# Patient Record
Sex: Male | Born: 1975 | Race: White | Hispanic: No | Marital: Married | Smoking: Former smoker
Health system: Southern US, Community
[De-identification: ages and names within clinical notes are randomized; demographics above are authoritative.]

## PROBLEM LIST (undated history)

## (undated) DIAGNOSIS — J189 Pneumonia, unspecified organism: Secondary | ICD-10-CM

## (undated) DIAGNOSIS — F17200 Nicotine dependence, unspecified, uncomplicated: Secondary | ICD-10-CM

## (undated) DIAGNOSIS — R519 Headache, unspecified: Secondary | ICD-10-CM

## (undated) DIAGNOSIS — R51 Headache: Secondary | ICD-10-CM

## (undated) DIAGNOSIS — R002 Palpitations: Secondary | ICD-10-CM

## (undated) DIAGNOSIS — C649 Malignant neoplasm of unspecified kidney, except renal pelvis: Secondary | ICD-10-CM

## (undated) DIAGNOSIS — E041 Nontoxic single thyroid nodule: Secondary | ICD-10-CM

## (undated) HISTORY — DX: Headache: R51

## (undated) HISTORY — PX: PILONIDAL CYST EXCISION: SHX744

## (undated) HISTORY — DX: Nicotine dependence, unspecified, uncomplicated: F17.200

## (undated) HISTORY — PX: OTHER SURGICAL HISTORY: SHX169

## (undated) HISTORY — PX: PARTIAL HIP ARTHROPLASTY: SHX733

## (undated) HISTORY — DX: Nontoxic single thyroid nodule: E04.1

## (undated) HISTORY — PX: WISDOM TOOTH EXTRACTION: SHX21

## (undated) HISTORY — DX: Palpitations: R00.2

## (undated) HISTORY — DX: Headache, unspecified: R51.9

---

## 1997-09-27 ENCOUNTER — Emergency Department (HOSPITAL_COMMUNITY): Admission: EM | Admit: 1997-09-27 | Discharge: 1997-09-27 | Payer: Self-pay | Admitting: Emergency Medicine

## 2014-04-03 ENCOUNTER — Ambulatory Visit (INDEPENDENT_AMBULATORY_CARE_PROVIDER_SITE_OTHER): Payer: 59 | Admitting: Family Medicine

## 2014-04-03 ENCOUNTER — Encounter: Payer: Self-pay | Admitting: Family Medicine

## 2014-04-03 VITALS — BP 130/86 | HR 80 | Temp 98.4°F | Resp 18 | Ht 65.0 in | Wt 203.0 lb

## 2014-04-03 DIAGNOSIS — R002 Palpitations: Secondary | ICD-10-CM

## 2014-04-03 DIAGNOSIS — F17200 Nicotine dependence, unspecified, uncomplicated: Secondary | ICD-10-CM

## 2014-04-03 NOTE — Progress Notes (Signed)
Office Note 04/03/2014  CC:  Chief Complaint  Patient presents with  . Establish Care    never had PCP   HPI:  Gordon Mcclain is a 39 y.o. White male who is here to establish care. Patient's most recent primary MD: none Old records were not reviewed prior to or during today's visit.  Over the past week or so he has noted a sensation of his heart fluttering a few times a day for a few seconds. No assoc sx's such as CP, dizziness, diaphoresis, SOB, or presyncope.  He noted it more often when sitting still not working. No known trigger.  No known alleviating factors.   Work is always stressful, no more so lately than usual. This has never happened before.  No exercise regimen, but has occasion when he will work out and feels no abnormal CP, SOB, fatigue, or dizziness.  Past Medical History  Diagnosis Date  . Frequent headaches     occipital region mostly: takes ibuprofen 800 mg bid most days    Past Surgical History  Procedure Laterality Date  . Pilonidal cyst excision  approx 2002    Family History  Problem Relation Age of Onset  . Arthritis Mother   . Cancer Maternal Aunt   . Heart attack Maternal Uncle   . Heart attack Maternal Grandmother   . Heart attack Maternal Grandfather   . Heart attack Maternal Aunt     History   Social History  . Marital Status: Married    Spouse Name: N/A    Number of Children: N/A  . Years of Education: N/A   Occupational History  . Not on file.   Social History Main Topics  . Smoking status: Current Every Day Smoker -- 1.00 packs/day for 20 years    Types: Cigarettes  . Smokeless tobacco: Never Used  . Alcohol Use: No  . Drug Use: No  . Sexual Activity: Not on file   Other Topics Concern  . Not on file   Social History Narrative   Married, 1 daughter and one son.   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   Orig from [place], [place] area.   Education: HS.   Tob 20  pck-yr hx  (current as of 03/2014).   Alc: none.  No hx of drug use.   Caffeine: 3-4 cups coffee/day, 1 cup tea per day.   No exercise.   MEDS: none except ibuprofen 800 mg bid most days  No Known Allergies  ROS Review of Systems  Constitutional: Negative for fever and fatigue.  HENT: Negative for congestion and sore throat.   Eyes: Negative for visual disturbance.  Respiratory: Negative for cough.   Cardiovascular: Negative for chest pain.  Gastrointestinal: Negative for nausea and abdominal pain.  Genitourinary: Negative for dysuria.  Musculoskeletal: Positive for arthralgias (traps/neck/back--due to physical labor/stress of work). Negative for back pain and joint swelling.  Skin: Negative for rash.  Neurological: Negative for weakness and headaches.  Hematological: Negative for adenopathy.    PE; Blood pressure 130/86, pulse 80, temperature 98.4 F (36.9 C), temperature source Temporal, resp. rate 18, height 5\' 5"  (1.651 m), weight 203 lb (92.08 kg), SpO2 96 %. Gen: Alert, well appearing.  Patient is oriented to person, place, time, and situation. VEH:MCNO: no injection, icteris, swelling, or exudate.  EOMI, PERRLA. Mouth: lips without lesion/swelling.  Oral mucosa pink and moist. Oropharynx without erythema, exudate, or swelling.  Neck - No masses or thyromegaly or limitation in range of motion  CV: RRR, no m/r/g.   LUNGS: CTA bilat, nonlabored resps, good aeration in all lung fields. ABD: soft, NT, ND, BS normal.  No hepatospenomegaly or mass.  No bruits. EXT: no clubbing, cyanosis, or edema.   Pertinent labs:  12 lead EKG today: NSR, rate 69, normal intervals, durations, and voltages.  Axis normal.  No ectopy or ischemic changes. Essentially a normal EKG.  No prior EKG available for comparison.  ASSESSMENT AND PLAN:   New pt: no old records to obtain.  Palpitations: reassuring EKG today. Very short duration of sx's. He does have excessive caffeine intake+ excessive work  stress. Discussed cutting back on coffee intake by 1/2 cup per week until he is down to 1 cup per day along with his 1 cup of tea per day. Work on stress reduction/coping techniques. Encouraged smoking cessation but he is not contemplating this at this time.  An After Visit Summary was printed and given to the patient.  Return in about 2 months (around 06/02/2014) for f/u palpitations.Earlier if sx's worsen.

## 2014-04-06 ENCOUNTER — Telehealth: Payer: Self-pay | Admitting: Family Medicine

## 2014-04-06 NOTE — Telephone Encounter (Signed)
emmi emailed °

## 2014-04-24 ENCOUNTER — Other Ambulatory Visit (INDEPENDENT_AMBULATORY_CARE_PROVIDER_SITE_OTHER): Payer: 59

## 2014-04-24 DIAGNOSIS — R002 Palpitations: Secondary | ICD-10-CM

## 2014-04-24 LAB — COMPREHENSIVE METABOLIC PANEL
ALK PHOS: 64 U/L (ref 39–117)
ALT: 21 U/L (ref 0–53)
AST: 20 U/L (ref 0–37)
Albumin: 4.2 g/dL (ref 3.5–5.2)
BUN: 11 mg/dL (ref 6–23)
CHLORIDE: 104 meq/L (ref 96–112)
CO2: 28 mEq/L (ref 19–32)
Calcium: 9.5 mg/dL (ref 8.4–10.5)
Creatinine, Ser: 0.78 mg/dL (ref 0.40–1.50)
GFR: 117.34 mL/min (ref >60.00)
GLUCOSE: 76 mg/dL (ref 70–99)
Potassium: 4.3 mEq/L (ref 3.5–5.1)
Sodium: 138 mEq/L (ref 135–145)
Total Bilirubin: 0.7 mg/dL (ref 0.2–1.2)
Total Protein: 6.5 g/dL (ref 6.0–8.3)

## 2014-04-24 LAB — CBC WITH DIFFERENTIAL/PLATELET
BASOS ABS: 0 10*3/uL (ref 0.0–0.1)
Basophils Relative: 0.4 % (ref 0.0–3.0)
Eosinophils Absolute: 0.2 10*3/uL (ref 0.0–0.7)
Eosinophils Relative: 1.9 % (ref 0.0–5.0)
HCT: 46.3 % (ref 39.0–52.0)
HEMOGLOBIN: 15.7 g/dL (ref 13.0–17.0)
LYMPHS PCT: 26.3 % (ref 12.0–46.0)
Lymphs Abs: 2.2 10*3/uL (ref 0.7–4.0)
MCHC: 34 g/dL (ref 30.0–36.0)
MCV: 89.7 fl (ref 78.0–100.0)
Monocytes Absolute: 0.5 10*3/uL (ref 0.1–1.0)
Monocytes Relative: 6.3 % (ref 3.0–12.0)
Neutro Abs: 5.4 10*3/uL (ref 1.4–7.7)
Neutrophils Relative %: 65.1 % (ref 43.0–77.0)
PLATELETS: 218 10*3/uL (ref 150.0–400.0)
RBC: 5.16 Mil/uL (ref 4.22–5.81)
RDW: 13.1 % (ref 11.5–15.5)
WBC: 8.3 10*3/uL (ref 4.0–10.5)

## 2014-04-24 LAB — LIPID PANEL
CHOL/HDL RATIO: 5
CHOLESTEROL: 196 mg/dL (ref 0–200)
HDL: 37.9 mg/dL — ABNORMAL LOW (ref >39.00)
LDL CALC: 122 mg/dL — AB (ref 0–99)
NONHDL: 158.1
Triglycerides: 181 mg/dL — ABNORMAL HIGH (ref 0.0–149.0)
VLDL: 36.2 mg/dL (ref 0.0–40.0)

## 2014-04-24 LAB — TSH: TSH: 0.96 u[IU]/mL (ref 0.35–4.50)

## 2014-05-27 ENCOUNTER — Ambulatory Visit: Payer: 59 | Admitting: Family Medicine

## 2020-11-22 ENCOUNTER — Encounter (HOSPITAL_COMMUNITY): Payer: Self-pay | Admitting: Emergency Medicine

## 2020-11-22 ENCOUNTER — Other Ambulatory Visit: Payer: Self-pay

## 2020-11-22 ENCOUNTER — Emergency Department (HOSPITAL_COMMUNITY)
Admission: EM | Admit: 2020-11-22 | Discharge: 2020-11-22 | Disposition: A | Discharge status: Home / Self Care | Payer: 59 | Attending: Emergency Medicine | Admitting: Emergency Medicine

## 2020-11-22 ENCOUNTER — Emergency Department (HOSPITAL_COMMUNITY): Payer: 59

## 2020-11-22 DIAGNOSIS — R0789 Other chest pain: Secondary | ICD-10-CM | POA: Diagnosis not present

## 2020-11-22 DIAGNOSIS — F17210 Nicotine dependence, cigarettes, uncomplicated: Secondary | ICD-10-CM | POA: Insufficient documentation

## 2020-11-22 DIAGNOSIS — R079 Chest pain, unspecified: Secondary | ICD-10-CM | POA: Diagnosis present

## 2020-11-22 DIAGNOSIS — R109 Unspecified abdominal pain: Secondary | ICD-10-CM | POA: Insufficient documentation

## 2020-11-22 LAB — URINALYSIS, ROUTINE W REFLEX MICROSCOPIC
Bacteria, UA: NONE SEEN
Bilirubin Urine: NEGATIVE
Glucose, UA: NEGATIVE mg/dL
Hgb urine dipstick: NEGATIVE
Ketones, ur: 5 mg/dL — AB
Leukocytes,Ua: NEGATIVE
Nitrite: NEGATIVE
Protein, ur: 30 mg/dL — AB
Specific Gravity, Urine: 1.026 (ref 1.005–1.030)
pH: 6 (ref 5.0–8.0)

## 2020-11-22 LAB — LIPASE, BLOOD: Lipase: 37 U/L (ref 11–51)

## 2020-11-22 LAB — CBC
HCT: 46.3 % (ref 39.0–52.0)
Hemoglobin: 15.3 g/dL (ref 13.0–17.0)
MCH: 31 pg (ref 26.0–34.0)
MCHC: 33 g/dL (ref 30.0–36.0)
MCV: 93.7 fL (ref 80.0–100.0)
Platelets: 235 10*3/uL (ref 150–400)
RBC: 4.94 MIL/uL (ref 4.22–5.81)
RDW: 12.1 % (ref 11.5–15.5)
WBC: 7.9 10*3/uL (ref 4.0–10.5)
nRBC: 0 % (ref 0.0–0.2)

## 2020-11-22 LAB — COMPREHENSIVE METABOLIC PANEL
ALT: 18 U/L (ref 0–44)
AST: 22 U/L (ref 15–41)
Albumin: 3.7 g/dL (ref 3.5–5.0)
Alkaline Phosphatase: 57 U/L (ref 38–126)
Anion gap: 9 (ref 5–15)
BUN: 15 mg/dL (ref 6–20)
CO2: 27 mmol/L (ref 22–32)
Calcium: 9.1 mg/dL (ref 8.9–10.3)
Chloride: 101 mmol/L (ref 98–111)
Creatinine, Ser: 0.98 mg/dL (ref 0.61–1.24)
GFR, Estimated: >60 mL/min (ref >60)
Glucose, Bld: 98 mg/dL (ref 70–99)
Potassium: 3.7 mmol/L (ref 3.5–5.1)
Sodium: 137 mmol/L (ref 135–145)
Total Bilirubin: 0.8 mg/dL (ref 0.3–1.2)
Total Protein: 6.5 g/dL (ref 6.5–8.1)

## 2020-11-22 MED ORDER — ACETAMINOPHEN 325 MG PO TABS
650.0000 mg | ORAL_TABLET | Freq: Once | ORAL | Status: AC
  Administered 2020-11-22: 650 mg via ORAL
  Filled 2020-11-22: qty 2

## 2020-11-22 MED ORDER — IBUPROFEN 400 MG PO TABS
600.0000 mg | ORAL_TABLET | Freq: Once | ORAL | Status: AC
  Administered 2020-11-22: 600 mg via ORAL
  Filled 2020-11-22: qty 1

## 2020-11-22 NOTE — Discharge Instructions (Addendum)
Your labs were normal appearing today.  Your chest x-ray did not show any evidence of lung pathology, broken rib or any other finding.  There is a possibility of a hairline fracture that can be difficult to see, but will not require any additional treatment other than monitoring for healing and pain control with Tylenol and Motrin at home. Return back to the ER if you have worsening pain fevers difficulty breathing or any additional concerns.  Otherwise follow-up with your doctor within the week.

## 2020-11-22 NOTE — ED Triage Notes (Signed)
Patient coming from home, complaint of left sided flank pain that began yesterday, worse this morning.

## 2020-11-22 NOTE — ED Notes (Signed)
Patient transported to X-ray 

## 2020-11-22 NOTE — ED Provider Notes (Signed)
[facility] EMERGENCY DEPARTMENT Provider Note   CSN: [id number] Arrival date & time: 11/22/20  [id number]     History Chief Complaint  Patient presents with   Flank Pain    Gordon Mcclain is a 45 y.o. male.  Patient presents chief complaint of left-sided chest pain.  Describes as a dull pain beneath left breast rating to the left flank.  Symptoms have been there for about 2 weeks persistent and chronic.  He states that this morning she had a sneeze and afterwards he had sharp exacerbation of the left-sided pain.  Moving his torso left and right makes it worse.  Taking a deep breath makes it worse.  Denies any fevers denies persistent cough no vomiting or diarrhea.      Past Medical History:  Diagnosis Date   Frequent headaches    occipital region mostly: takes ibuprofen 800 mg bid most days   Tobacco dependence     There are no problems to display for this patient.   Past Surgical History:  Procedure Laterality Date   PILONIDAL CYST EXCISION  approx 2002       Family History  Problem Relation Age of Onset   Arthritis Mother    Cancer Maternal Aunt    Heart attack Maternal Uncle    Heart attack Maternal Grandmother    Heart attack Maternal Grandfather    Heart attack Maternal Aunt     Social History   Tobacco Use   Smoking status: Every Day    Packs/day: 1.00    Years: 20.00    Pack years: 20.00    Types: Cigarettes   Smokeless tobacco: Never  Substance Use Topics   Alcohol use: No   Drug use: No    Home Medications Prior to Admission medications   Not on File    Allergies    Patient has no known allergies.  Review of Systems   Review of Systems  Constitutional:  Negative for fever.  HENT:  Negative for ear pain and sore throat.   Eyes:  Negative for pain.  Respiratory:  Negative for cough.   Cardiovascular:  Positive for chest pain.  Gastrointestinal:  Negative for abdominal pain.  Genitourinary:  Negative for flank pain.   Musculoskeletal:  Negative for back pain.  Skin:  Negative for color change and rash.  Neurological:  Negative for syncope.  All other systems reviewed and are negative.  Physical Exam Updated Vital Signs BP (!) 140/103 (BP Location: Left Arm)   Pulse 75   Temp 98.5 F (36.9 C) (Oral)   Resp 18   Ht '5\' 5"'$  (1.651 m)   Wt 86.2 kg   SpO2 98%   BMI 31.62 kg/m   Physical Exam Constitutional:      Appearance: He is well-developed.  HENT:     Head: Normocephalic.     Nose: Nose normal.  Eyes:     Extraocular Movements: Extraocular movements intact.  Cardiovascular:     Rate and Rhythm: Normal rate.  Pulmonary:     Effort: Pulmonary effort is normal.  Musculoskeletal:     Comments: Pain with palpation of the left lateral chest wall.  No crepitus noted.  No skin lesions noted.  Skin:    Coloration: Skin is not jaundiced.  Neurological:     Mental Status: He is alert. Mental status is at baseline.    ED Results / Procedures / Treatments   Labs (all labs ordered are listed, but only abnormal results  are displayed) Labs Reviewed  URINALYSIS, ROUTINE W REFLEX MICROSCOPIC - Abnormal; Notable for the following components:      Result Value   Color, Urine AMBER (*)    APPearance HAZY (*)    Ketones, ur 5 (*)    Protein, ur 30 (*)    All other components within normal limits  COMPREHENSIVE METABOLIC PANEL  CBC  LIPASE, BLOOD    EKG None  Radiology DG Ribs Unilateral W/Chest Left  Result Date: 11/22/2020 CLINICAL DATA:  Left rib pain EXAM: LEFT RIBS AND CHEST - 3+ VIEW COMPARISON:  None. FINDINGS: There is no evidence of displaced rib fracture. Cardiomediastinal silhouette is within normal limits. There is no focal airspace disease. There is no large pleural effusion or visible pneumothorax. IMPRESSION: No evidence of displaced rib fracture. Electronically Signed   By: Maurine Simmering M.D.   On: 11/22/2020 13:26    Procedures Procedures   Medications Ordered in  ED Medications  acetaminophen (TYLENOL) tablet 650 mg (650 mg Oral Given 11/22/20 1313)  ibuprofen (ADVIL) tablet 600 mg (600 mg Oral Given 11/22/20 1313)    ED Course  I have reviewed the triage vital signs and the nursing notes.  Pertinent labs & imaging results that were available during my care of the patient were reviewed by me and considered in my medical decision making (see chart for details).    MDM Rules/Calculators/A&P                           Clinically suspect muscle skeletal pain.  Cardiac work-up performed with no acute findings noted.  Chest x-ray shows no pneumothorax or rib fracture or other abnormality.  Patient given Tylenol and Motrin here in the ER.  Advise continued monitoring and outpatient follow-up with his doctor within the week.  Advising immediate return if he has worsening pain difficulty breathing fevers or any additional concerns.  Final Clinical Impression(s) / ED Diagnoses Final diagnoses:  Chest pain, unspecified type    Rx / DC Orders ED Discharge Orders     None        Luna Fuse, MD 11/22/20 1345

## 2020-11-26 ENCOUNTER — Other Ambulatory Visit: Payer: Self-pay

## 2020-11-30 ENCOUNTER — Other Ambulatory Visit: Payer: Self-pay

## 2020-12-01 ENCOUNTER — Ambulatory Visit (INDEPENDENT_AMBULATORY_CARE_PROVIDER_SITE_OTHER): Payer: 59 | Admitting: Family Medicine

## 2020-12-01 ENCOUNTER — Encounter: Payer: Self-pay | Admitting: Family Medicine

## 2020-12-01 VITALS — BP 132/75 | HR 74 | Temp 98.4°F | Ht 65.95 in | Wt 192.4 lb

## 2020-12-01 DIAGNOSIS — R0789 Other chest pain: Secondary | ICD-10-CM

## 2020-12-01 NOTE — Progress Notes (Signed)
Office Note 12/01/2020  CC:  Chief Complaint  Patient presents with   Establish Care    Left side pain that moved to right groin area for a few days but still faint on left side.   HPI:  Gordon Mcclain is a 45 y.o. male who is here to re-establish care. I saw him once for his establish care visit back in 2016. Patient's most recent primary MD: none Old records in EPIC/[facility] EMR reviewed prior to or during today's visit.  Recent [facility] ED visit for L chest wall pain onset right after very forceful sneeze.  I reviewed this encounter documentation in EMR.  Labs, CXR, and EKG all normal and pt dx'd with musculoskeletal CP and was given toradol IM. Of note, he had gone to [facility] hosp ED for same complaint that day or day prior and x-ray was done and showed no abnormalities, also UA, CBC w/diff, cmet, and lipase all normal.  HPI: He says he started to have some pain in L scapula area that felt deep, was there several days, started hurting some in L lateral chest intermittent around same time, then sneezed very forcefully while trying to hold sneeze in and got abrupt severe pain in L chest wall. Since ED visits he has been taking ibup 600-800 bid and relaxing in pool , applying heat sometimes, stretching some-->this has resulted in improvement. Still occ feels a pull or popping sensation with certain movements of rib cage/torso.  No pain with deep breath.  No exertional pain.  He has also been avoiding heavy labor lately at [place].   ROS as above, plus--> no fevers,no SOB, no DOE, no wheezing, no cough, no dizziness, no HAs, no rashes, no melena/hematochezia.  No polyuria or polydipsia.  No myalgias or arthralgias.  No focal weakness, paresthesias, or tremors.  No acute vision or hearing abnormalities.  No dysuria or unusual/new urinary urgency or frequency.  No recent changes in lower legs. No n/v/d or abd pain.  No palpitations.     Past Medical History:  Diagnosis  Date   Frequent headaches    occipital region mostly: takes ibuprofen 800 mg bid most days   Tobacco dependence     Past Surgical History:  Procedure Laterality Date   PILONIDAL CYST EXCISION  approx 2002    Family History  Problem Relation Age of Onset   Arthritis Mother    Cancer Maternal Aunt    Heart attack Maternal Uncle    Heart attack Maternal Grandmother    Heart attack Maternal Grandfather    Heart attack Maternal Aunt     Social History   Socioeconomic History   Marital status: Married    Spouse name: Not on file   Number of children: Not on file   Years of education: Not on file   Highest education level: Not on file  Occupational History   Not on file  Tobacco Use   Smoking status: Every Day    Packs/day: 1.00    Years: 20.00    Pack years: 20.00    Types: Cigarettes   Smokeless tobacco: Never  Substance and Sexual Activity   Alcohol use: No   Drug use: No   Sexual activity: Not on file  Other Topics Concern   Not on file  Social History Narrative   Married, 1 daughter and one son.   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   Education: HS.   Tob 20  pck-yr hx (current as of 03/2014).   Alc: none.  No hx of drug use.   Caffeine: 3-4 cups coffee/day, 1 cup tea per day.   No exercise.   Social Determinants of Health   Financial Resource Strain: Not on file  Food Insecurity: Not on file  Transportation Needs: Not on file  Physical Activity: Not on file  Stress: Not on file  Social Connections: Not on file  Intimate Partner Violence: Not on file    Outpatient Encounter Medications as of 12/01/2020  Medication Sig   ibuprofen (ADVIL) 800 MG tablet Take by mouth.   cyclobenzaprine (FLEXERIL) 10 MG tablet Take by mouth. (Patient not taking: Reported on 12/01/2020)   No facility-administered encounter medications on file as of 12/01/2020.    No Known Allergies  PE; Blood  pressure 132/75, pulse 74, temperature 98.4 F (36.9 C), height 5' 5.95" (1.675 m), weight 192 lb 6.4 oz (87.3 kg), SpO2 99 %. Gen: Alert, well appearing.  Patient is oriented to person, place, time, and situation. AFFECT: pleasant, lucid thought and speech. CV: RRR, no m/r/g.   LUNGS: CTA bilat, nonlabored resps, good aeration in all lung fields. Back, shoulders and rib cage and flanks and abd all nontender.  No crepitance or deformity.  No ecchymoses. No rash.  Pertinent labs:  Lab Results  Component Value Date   TSH 0.96 04/24/2014   Lab Results  Component Value Date   WBC 7.9 11/22/2020   HGB 15.3 11/22/2020   HCT 46.3 11/22/2020   MCV 93.7 11/22/2020   PLT 235 11/22/2020   Lab Results  Component Value Date   CREATININE 0.98 11/22/2020   BUN 15 11/22/2020   NA 137 11/22/2020   K 3.7 11/22/2020   CL 101 11/22/2020   CO2 27 11/22/2020   Lab Results  Component Value Date   ALT 18 11/22/2020   AST 22 11/22/2020   ALKPHOS 57 11/22/2020   BILITOT 0.8 11/22/2020   Lab Results  Component Value Date   LIPASE 37 11/22/2020   Lab Results  Component Value Date   CHOL 196 04/24/2014   Lab Results  Component Value Date   HDL 37.90 (L) 04/24/2014   Lab Results  Component Value Date   LDLCALC 122 (H) 04/24/2014   Lab Results  Component Value Date   TRIG 181.0 (H) 04/24/2014   Lab Results  Component Value Date   CHOLHDL 5 04/24/2014    ASSESSMENT AND PLAN:   Re-establish care visit.  Recent L chest wall pain, highly suspect musculoskeletal strain etiology. Reassuring ED w/u about a week ago.  Normal exam. No further testing indicated at this time. Continue ibuprofen 600-800 bid with food.  Cont heat, massage, pool time, relative rest.  Spent 32 min with pt today reviewing HPI, reviewing relevant past history, doing exam, reviewing and discussing lab and imaging data, and formulating plans.  An After Visit Summary was printed and given to the  patient.  Signed:  Crissie Sickles, MD           12/01/2020

## 2021-03-25 ENCOUNTER — Encounter: Payer: Self-pay | Admitting: Emergency Medicine

## 2021-03-25 ENCOUNTER — Emergency Department (INDEPENDENT_AMBULATORY_CARE_PROVIDER_SITE_OTHER)
Admission: EM | Admit: 2021-03-25 | Discharge: 2021-03-25 | Disposition: A | Discharge status: Home / Self Care | Payer: 59 | Source: Home / Self Care

## 2021-03-25 ENCOUNTER — Other Ambulatory Visit: Payer: Self-pay

## 2021-03-25 ENCOUNTER — Emergency Department (INDEPENDENT_AMBULATORY_CARE_PROVIDER_SITE_OTHER): Payer: 59

## 2021-03-25 DIAGNOSIS — R059 Cough, unspecified: Secondary | ICD-10-CM

## 2021-03-25 DIAGNOSIS — R0602 Shortness of breath: Secondary | ICD-10-CM

## 2021-03-25 DIAGNOSIS — R0789 Other chest pain: Secondary | ICD-10-CM | POA: Diagnosis not present

## 2021-03-25 NOTE — ED Triage Notes (Addendum)
Left sided rib  pain intermittent since September  EKG/CXR done in September 2022 History of heart disease in the family  Pain is on the left - radiates around to left side of back to left shoulder blade  Ibuprofen 800mg   BID  Nausea after eating - pain w/ coughing Felt dizzy and nausea on Wed  night watching TV - felt like his heart was racing  No symptoms in triage  Here w/ his wife  Both asked about a CT scan

## 2021-03-25 NOTE — ED Provider Notes (Signed)
[facility]    CSN: [id number] Arrival date & time: 03/25/21  2505      History   Chief Complaint Chief Complaint  Patient presents with   Nausea    HPI Gordon Mcclain is a 45 y.o. male.   HPI 46 year old male presents with left-sided rib pain intermittent since September.  EKG/left-sided chest x-ray-done on 11/22/20 were both unremarkable.  Reports family history of heart disease and is currently a 1 pack a day smoker.  Patient is accompanied by his wife and reports felt dizzy and nausea on Wednesday (03/23/21) night while watching TV as well as his heart respiration.  No symptoms in triage.  Past Medical History:  Diagnosis Date   Frequent headaches    occipital region mostly: takes ibuprofen 800 mg bid most days   Tobacco dependence     There are no problems to display for this patient.   Past Surgical History:  Procedure Laterality Date   PILONIDAL CYST EXCISION  approx 2002       Home Medications    Prior to Admission medications   Medication Sig Start Date End Date Taking? Authorizing Provider  cyclobenzaprine (FLEXERIL) 10 MG tablet Take by mouth. Patient not taking: Reported on 12/01/2020 11/22/20   [provider]  ibuprofen (ADVIL) 800 MG tablet Take by mouth. 11/22/20   [provider]    Family History Family History  Problem Relation Age of Onset   Arthritis Mother    Cancer Maternal Aunt    Heart attack Maternal Uncle    Heart attack Maternal Grandmother    Heart attack Maternal Grandfather    Heart attack Maternal Aunt     Social History Social History   Tobacco Use   Smoking status: Every Day    Packs/day: 1.00    Years: 20.00    Pack years: 20.00    Types: Cigarettes   Smokeless tobacco: Never  Vaping Use   Vaping Use: Never used  Substance Use Topics   Alcohol use: No   Drug use: No     Allergies   Patient has no known allergies.   Review of Systems Review of Systems  Constitutional:         Patient reports intermittent chest pain, nausea and dizziness 2 days ago while at home watching TV.  Patient is asymptomatic currently.  Denies anginal equivalents, shortness of breath, lightheadedness/dizziness, presyncopal or syncopal episodes.  Cardiovascular:  Positive for chest pain.  Gastrointestinal:  Positive for nausea.  Neurological:  Positive for dizziness.  All other systems reviewed and are negative.   Physical Exam Triage Vital Signs ED Triage Vitals  Enc Vitals Group     BP      Pulse      Resp      Temp      Temp src      SpO2      Weight      Height      Head Circumference      Peak Flow      Pain Score      Pain Loc      Pain Edu?      Excl. in [place]?    No data found.  Updated Vital Signs BP 128/84 (BP Location: Left Arm)    Pulse 76    Temp 98.6 F (37 C) (Oral)    Resp 16    Ht 5\' 5"  (1.651 m)    SpO2 98%    BMI  32.02 kg/m      Physical Exam Vitals and nursing note reviewed.  Constitutional:      Appearance: Normal appearance. He is normal weight.  HENT:     Head: Normocephalic and atraumatic.     Right Ear: Tympanic membrane, ear canal and external ear normal.     Left Ear: Tympanic membrane, ear canal and external ear normal.     Mouth/Throat:     Mouth: Mucous membranes are moist.     Pharynx: Oropharynx is clear.  Eyes:     Extraocular Movements: Extraocular movements intact.     Conjunctiva/sclera: Conjunctivae normal.     Pupils: Pupils are equal, round, and reactive to light.  Neck:     Comments: No JVD, no bruit Cardiovascular:     Rate and Rhythm: Normal rate and regular rhythm.     Pulses: Normal pulses.     Heart sounds: Normal heart sounds.  Pulmonary:     Effort: Pulmonary effort is normal.     Breath sounds: Normal breath sounds.  Musculoskeletal:     Cervical back: Normal range of motion and neck supple.  Skin:    General: Skin is warm and dry.  Neurological:     General: No focal deficit present.     Mental  Status: He is alert and oriented to person, place, and time. Mental status is at baseline.     UC Treatments / Results  Labs (all labs ordered are listed, but only abnormal results are displayed) Labs Reviewed - No data to display  EKG   Radiology DG Chest 2 View  Result Date: 03/25/2021 CLINICAL DATA:  Intermittent pain under the left breast since September. Some associated cough and shortness of breath. EXAM: CHEST - 2 VIEW COMPARISON:  November 22, 2020. FINDINGS: The heart size and mediastinal contours are within normal limits. Both lungs are clear. The visualized skeletal structures are unremarkable. IMPRESSION: No active cardiopulmonary disease. Electronically Signed   By: Titus Dubin M.D.   On: 03/25/2021 09:31    Procedures Procedures (including critical care time)  Medications Ordered in UC Medications - No data to display  Initial Impression / Assessment and Plan / UC Course  I have reviewed the triage vital signs and the nursing notes.  Pertinent labs & imaging results that were available during my care of the patient were reviewed by me and considered in my medical decision making (see chart for details).     MDM: 1.  Other chest pain-Advised patient of today's chest x-ray results.  Advised/strongly encouraged patient to follow-up with PCP for baseline fasting labs/physical exam.  Advised if symptoms similar to 03/23/2021 reoccur please go to nearest ED for immediate evaluation including blood work (serial troponins) to rule out unstable angina or other.  Patient discharged home, hemodynamically stable.  Final Clinical Impressions(s) / UC Diagnoses   Final diagnoses:  Other chest pain     Discharge Instructions      Advised patient of today's chest x-ray results.  Advised/strongly encouraged patient to follow-up with PCP for baseline fasting labs/physical exam.  Advised if symptoms similar to 03/23/2021 reoccur please go to nearest ED for immediate evaluation  including blood work (serial troponins) to rule out unstable angina or other.     ED Prescriptions   None    PDMP not reviewed this encounter.   Eliezer Lofts, [place] 03/25/21 1001

## 2021-03-25 NOTE — Discharge Instructions (Addendum)
Advised patient of today's chest x-ray results.  Advised/strongly encouraged patient to follow-up with PCP for baseline fasting labs/physical exam.  Advised if symptoms similar to 03/23/2021 reoccur please go to nearest ED for immediate evaluation including blood work (serial troponins) to rule out unstable angina or other.

## 2021-03-27 DIAGNOSIS — C649 Malignant neoplasm of unspecified kidney, except renal pelvis: Secondary | ICD-10-CM

## 2021-03-27 HISTORY — DX: Malignant neoplasm of unspecified kidney, except renal pelvis: C64.9

## 2021-04-08 ENCOUNTER — Ambulatory Visit (INDEPENDENT_AMBULATORY_CARE_PROVIDER_SITE_OTHER): Payer: 59

## 2021-04-08 ENCOUNTER — Encounter: Payer: Self-pay | Admitting: Family Medicine

## 2021-04-08 ENCOUNTER — Other Ambulatory Visit: Payer: Self-pay

## 2021-04-08 ENCOUNTER — Ambulatory Visit (INDEPENDENT_AMBULATORY_CARE_PROVIDER_SITE_OTHER): Payer: 59 | Admitting: Family Medicine

## 2021-04-08 VITALS — BP 111/75 | HR 63 | Temp 98.2°F | Ht 65.95 in | Wt 193.6 lb

## 2021-04-08 DIAGNOSIS — Z8249 Family history of ischemic heart disease and other diseases of the circulatory system: Secondary | ICD-10-CM

## 2021-04-08 DIAGNOSIS — M791 Myalgia, unspecified site: Secondary | ICD-10-CM

## 2021-04-08 DIAGNOSIS — R002 Palpitations: Secondary | ICD-10-CM

## 2021-04-08 DIAGNOSIS — R0789 Other chest pain: Secondary | ICD-10-CM | POA: Diagnosis not present

## 2021-04-08 DIAGNOSIS — M7918 Myalgia, other site: Secondary | ICD-10-CM

## 2021-04-08 DIAGNOSIS — F17200 Nicotine dependence, unspecified, uncomplicated: Secondary | ICD-10-CM

## 2021-04-08 LAB — CBC WITH DIFFERENTIAL/PLATELET
Basophils Absolute: 0.1 10*3/uL (ref 0.0–0.1)
Basophils Relative: 0.9 % (ref 0.0–3.0)
Eosinophils Absolute: 0.2 10*3/uL (ref 0.0–0.7)
Eosinophils Relative: 2.3 % (ref 0.0–5.0)
HCT: 44.6 % (ref 39.0–52.0)
Hemoglobin: 14.8 g/dL (ref 13.0–17.0)
Lymphocytes Relative: 30.9 % (ref 12.0–46.0)
Lymphs Abs: 2.1 10*3/uL (ref 0.7–4.0)
MCHC: 33.1 g/dL (ref 30.0–36.0)
MCV: 93 fl (ref 78.0–100.0)
Monocytes Absolute: 0.5 10*3/uL (ref 0.1–1.0)
Monocytes Relative: 7.2 % (ref 3.0–12.0)
Neutro Abs: 4.1 10*3/uL (ref 1.4–7.7)
Neutrophils Relative %: 58.7 % (ref 43.0–77.0)
Platelets: 222 10*3/uL (ref 150.0–400.0)
RBC: 4.8 Mil/uL (ref 4.22–5.81)
RDW: 12.7 % (ref 11.5–15.5)
WBC: 6.9 10*3/uL (ref 4.0–10.5)

## 2021-04-08 LAB — COMPREHENSIVE METABOLIC PANEL
ALT: 19 U/L (ref 0–53)
AST: 20 U/L (ref 0–37)
Albumin: 4.3 g/dL (ref 3.5–5.2)
Alkaline Phosphatase: 58 U/L (ref 39–117)
BUN: 13 mg/dL (ref 6–23)
CO2: 27 mEq/L (ref 19–32)
Calcium: 9.6 mg/dL (ref 8.4–10.5)
Chloride: 104 mEq/L (ref 96–112)
Creatinine, Ser: 0.81 mg/dL (ref 0.40–1.50)
GFR: 105.61 mL/min (ref >60.00)
Glucose, Bld: 81 mg/dL (ref 70–99)
Potassium: 4.4 mEq/L (ref 3.5–5.1)
Sodium: 139 mEq/L (ref 135–145)
Total Bilirubin: 0.7 mg/dL (ref 0.2–1.2)
Total Protein: 6.6 g/dL (ref 6.0–8.3)

## 2021-04-08 LAB — LIPID PANEL
Cholesterol: 178 mg/dL (ref 0–200)
HDL: 42.2 mg/dL (ref >39.00)
LDL Cholesterol: 120 mg/dL — ABNORMAL HIGH (ref 0–99)
NonHDL: 136.08
Total CHOL/HDL Ratio: 4
Triglycerides: 79 mg/dL (ref 0.0–149.0)
VLDL: 15.8 mg/dL (ref 0.0–40.0)

## 2021-04-08 LAB — HEMOGLOBIN A1C: Hgb A1c MFr Bld: 5.4 % (ref 4.6–6.5)

## 2021-04-08 LAB — TSH: TSH: 0.93 u[IU]/mL (ref 0.35–5.50)

## 2021-04-08 NOTE — Progress Notes (Unsigned)
Enrolled patient for a 14 day Zio XT  monitor to be mailed to patients home  °

## 2021-04-08 NOTE — Progress Notes (Signed)
OFFICE VISIT  04/08/2021  CC: chest pain, palpitations, f/u ED visit  HPI:    Patient is a 46 y.o. male who presents accompanied by his wife for ongoing prob with L sided chest pain. He has history of left-sided chest wall pain, onset at the end of August this year after a forceful sneeze. He had a thorough emergency department work-up 11/22/2020 that was negative.  I saw him in the office for follow-up of this and felt like he had chest wall pain, no new work-up done, recommended NSAIDs.  INTERIM HX: He went to the emergency department again on 03/25/2021.  Per ED encounter note he had some atypical chest pain, dizziness, and nausea while watching TV 1 night. He apparently also reported he had continued to have some intermittent left chest wall pain since I saw him in September. Chest x-ray normal.  No lab work.  No EKG. Reassured and f/u with me was recommended.  Currently: He has ongoing pretty constant pain in the left anterolateral and posterior rib area, seems to rove around in this region, waxes and wanes in intensity randomly, he finds it hard to describe. When he lies back he hears a soft but audible pop in his left anterior chest wall area with deep breaths. There is no associated shortness of breath, nausea, cough, wheeze, or arm pain or jaw pain with this.  As a separate issue, he has had a couple of episodes in the last couple of weeks of feeling some heart racing, sweaty, a bit nauseous.  Gets anxious when this happens, understandably.  However he does not feel like anxiety precipitates the symptoms.  He has no chest pain with the symptoms other than his chronic chest wall pain described above.  Pt is a cigarette smoker. He has a family history of heart disease on his mother side.  ROS as above, plus--> no fevers, no HAs, no rashes, no melena/hematochezia.  No polyuria or polydipsia.  No myalgias or arthralgias.  No focal weakness, paresthesias, or tremors.  No acute vision or  hearing abnormalities.  No dysuria or unusual/new urinary urgency or frequency.  No recent changes in lower legs. No diarrhea, no vomiting, no abdominal pain.   Past Medical History:  Diagnosis Date   Frequent headaches    occipital region mostly: takes ibuprofen 800 mg bid most days   Tobacco dependence     Past Surgical History:  Procedure Laterality Date   PILONIDAL CYST EXCISION  approx 2002   Family History  Problem Relation Age of Onset   Arthritis Mother    Cancer Maternal Aunt    Heart attack Maternal Uncle    Heart attack Maternal Grandmother    Heart attack Maternal Grandfather    Heart attack Maternal Aunt    Social History   Socioeconomic History   Marital status: Married    Spouse name: Not on file   Number of children: Not on file   Years of education: Not on file   Highest education level: Not on file  Occupational History   Not on file  Tobacco Use   Smoking status: Every Day    Packs/day: 1.00    Years: 20.00    Pack years: 20.00    Types: Cigarettes   Smokeless tobacco: Never  Vaping Use   Vaping Use: Never used  Substance and Sexual Activity   Alcohol use: No   Drug use: No   Sexual activity: Not on file  Other Topics Concern  Not on file  Social History Narrative   Married, 1 daughter and one son.   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   Orig from [place], [place] area.   Education: HS.   Tob 20  pck-yr hx (current as of 03/2014).   Alc: none.  No hx of drug use.   Caffeine: 3-4 cups coffee/day, 1 cup tea per day.   No exercise.   Social Determinants of Health   Financial Resource Strain: Not on file  Food Insecurity: Not on file  Transportation Needs: Not on file  Physical Activity: Not on file  Stress: Not on file  Social Connections: Not on file    Outpatient Medications Prior to Visit  Medication Sig Dispense Refill   cyclobenzaprine (FLEXERIL) 10 MG tablet Take by mouth. (Patient not  taking: Reported on 12/01/2020)     ibuprofen (ADVIL) 800 MG tablet Take by mouth. (Patient not taking: Reported on 04/08/2021)     No facility-administered medications prior to visit.    No Known Allergies  ROS As per HPI  PE: Vitals with BMI 04/08/2021 03/25/2021 12/01/2020  Height 5' 5.95" 5\' 5"  5' 5.945"  Weight 193 lbs 10 oz 191 lbs 13 oz 192 lbs 6 oz  BMI 31.3 16.10 96.04  Systolic 540 981 191  Diastolic 75 84 75  Pulse 63 76 74     Physical Exam  General: Alert and well-appearing. Affect is pleasant, lucid thought and speech. Cardiovascular: Regular rhythm and rate without murmur rub or gallop. Chest is clear, no wheezing or rales. Normal symmetric air entry throughout both lung fields.  He has focal tenderness to palpation in the lower left posterolateral rib area.  Question of feeling of subcutaneous fullness at this point.  No overlying skin changes.   With my ear placed near anterior chest wall I do hear a soft pop-like noise when he takes a deep breath laying on his back. Abdomen is soft and nontender.   LABS:  Last CBC Lab Results  Component Value Date   WBC 7.9 11/22/2020   HGB 15.3 11/22/2020   HCT 46.3 11/22/2020   MCV 93.7 11/22/2020   MCH 31.0 11/22/2020   RDW 12.1 11/22/2020   PLT 235 47/82/9562   Last metabolic panel Lab Results  Component Value Date   GLUCOSE 98 11/22/2020   NA 137 11/22/2020   K 3.7 11/22/2020   CL 101 11/22/2020   CO2 27 11/22/2020   BUN 15 11/22/2020   CREATININE 0.98 11/22/2020   GFRNONAA >60 11/22/2020   CALCIUM 9.1 11/22/2020   PROT 6.5 11/22/2020   ALBUMIN 3.7 11/22/2020   BILITOT 0.8 11/22/2020   ALKPHOS 57 11/22/2020   AST 22 11/22/2020   ALT 18 11/22/2020   ANIONGAP 9 11/22/2020   Last lipids Lab Results  Component Value Date   CHOL 196 04/24/2014   HDL 37.90 (L) 04/24/2014   LDLCALC 122 (H) 04/24/2014   TRIG 181.0 (H) 04/24/2014   CHOLHDL 5 04/24/2014   Last hemoglobin A1c No results found for:  HGBA1C Last thyroid functions Lab Results  Component Value Date   TSH 0.96 04/24/2014   IMPRESSION AND PLAN:  #1 palpitations.  Just a few episodes. Plan Zio patch monitoring. TSH, electrolytes, and CBC today.  2.  Atypical chest pain. Sounds like one brief episode when he was in the midst of one of his palpitations episodes. Checking lipid panel and A1c today for further risk factor delineation/risk stratification. He is a smoker and has  family history of heart disease. Coronary calcium score ordered today.  #3 chest wall pain.  Onset after significantly forceful cough August 2022. Some features consistent with myofascial etiology, has 1 trigger point on today's exam. Neuropathic pain lower suspicion. Rib x-rays 11/22/2020 normal. Chest x-ray 03/25/2021 normal. Will ask sports medicine to see him for further expert evaluation.  An After Visit Summary was printed and given to the patient.  FOLLOW UP: Return in about 4 weeks (around 05/06/2021) for f/u chest wall pain and palpitations.  Signed:  Crissie Sickles, MD           04/08/2021

## 2021-04-13 DIAGNOSIS — R002 Palpitations: Secondary | ICD-10-CM

## 2021-04-18 ENCOUNTER — Ambulatory Visit (INDEPENDENT_AMBULATORY_CARE_PROVIDER_SITE_OTHER): Payer: 59 | Admitting: Sports Medicine

## 2021-04-18 ENCOUNTER — Telehealth: Payer: Self-pay | Admitting: Oncology

## 2021-04-18 ENCOUNTER — Ambulatory Visit (INDEPENDENT_AMBULATORY_CARE_PROVIDER_SITE_OTHER): Payer: 59

## 2021-04-18 ENCOUNTER — Other Ambulatory Visit: Payer: Self-pay

## 2021-04-18 DIAGNOSIS — R0789 Other chest pain: Secondary | ICD-10-CM

## 2021-04-18 MED ORDER — PREDNISONE 50 MG PO TABS: ORAL_TABLET | ORAL | 0 refills | Status: DC

## 2021-04-18 NOTE — Progress Notes (Addendum)
° ° °  Procedures performed today:    None.  Independent interpretation of notes and tests performed by another provider:   None.  Brief History, Exam, Impression, and Recommendations:    Chest wall pain This is a pleasant 46 year old male, he has a long history of chest wall pain, localized posteriorly paraspinal and present since August 2022. There is a popping sensation when he takes deep breaths. He has had several x-rays, conservative treatment in the form of NSAIDs, activity modification without much improvement. He was referred to me for further evaluation and to see if there is a musculoskeletal component of his discomfort. On exam his lungs are clear, cardiac auscultation is unremarkable, he is wearing a Holter monitor patch on his left upper chest. Chest wall examination is normal with the exception of pain localized at the costovertebral junction with pressure over the costal margin. I am unable to palpate or reproduce any popping sensations. Considering the failure of conservative treatment for greater than 6 weeks as well as conservative investigation we will proceed with a CT scan of his chest without contrast to ensure that we are not looking at a mass. They do desire to have this done today, as thoracic radiculopathy is also on the differential we will do a 5-day burst of prednisone.  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  I spoke to the patient's PCP who agrees to take over care.  I spent 40 minutes of total time managing this patient today, this includes chart review, face to face, and non-face to face time, differential includes  life-threatening pathologies.   ___________________________________________ Gwen Her. Dianah Field, M.D., ABFM., CAQSM. Primary Care and [place] In[employer] of [place] [facility]

## 2021-04-18 NOTE — Addendum Note (Signed)
Addended by: Silverio Decamp on: 04/18/2021 03:39 PM   Modules accepted: Orders, Level of Service

## 2021-04-18 NOTE — Assessment & Plan Note (Addendum)
This is a pleasant 46 year old male, he has a long history of chest wall pain, localized posteriorly paraspinal and present since August 2022. There is a popping sensation when he takes deep breaths. He has had several x-rays, conservative treatment in the form of NSAIDs, activity modification without much improvement. He was referred to me for further evaluation and to see if there is a musculoskeletal component of his discomfort. On exam his lungs are clear, cardiac auscultation is unremarkable, he is wearing a Holter monitor patch on his left upper chest. Chest wall examination is normal with the exception of pain localized at the costovertebral junction with pressure over the costal margin. I am unable to palpate or reproduce any popping sensations. Considering the failure of conservative treatment for greater than 6 weeks as well as conservative investigation we will proceed with a CT scan of his chest without contrast to ensure that we are not looking at a mass. They do desire to have this done today, as thoracic radiculopathy is also on the differential we will do a 5-day burst of prednisone.  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  I spoke to the patient's PCP who agrees to take over care.

## 2021-04-18 NOTE — Telephone Encounter (Signed)
[facility] Scheduling Phone Note  I contacted Gordon Mcclain regarding a referral from Dr. Aundria Mems for purpose of evaluation and work up of renal mass, bone lesion.  Gordon Mcclain was aware of his referral and reason.  Information on reason for referral was not necessary.  Patient was informed about the [facility] and the intent being to complete a diagnostic/prognostic work-up for his suspicious findings.  He is aware his appointment is with our Physician Assistant to identify a diagnostic plan of care and to arrange further oncologist or specialist care as indicated.  I confirmed with Gordon Mcclain he  has transportation to his [facility] appointment.  Patient is aware to bring a list of medications, as well as insurance cards.  Visitor policy and COVID protocol reviewed with patient as well, including what to expect on arrival to the [facility] regarding check-in process, visit, and potential for labs afterwards.  We look forward to [place] and completing his diagnostic work-up and arranging his subsequent appointment with our oncologist team.  Diagnostic Clinic Specific Info:  Best contact/way to contact patient:  Primary contact in [place] updated to reflect?:  not applicable Is there a HC POA?:  No  Location of Diagnostic Clinic Appointment and Contact Info Provided to Patient:  [place] [facility] [place], [place] [address] [phone]   Reason for Referral, Clinical Information:  Seen by PCP, Dr. Anitra Lauth on 04/08/2021 for ongoing chest wall pain x 10/2020 per OV note and referred to Sports Medicine, Dr. Dianah Field for further eval. 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 Further evaluation with a chest and abdomen CT or MRI without and with contrast is recommended.

## 2021-04-19 ENCOUNTER — Encounter (HOSPITAL_COMMUNITY): Payer: Self-pay | Admitting: Radiology

## 2021-04-19 NOTE — Progress Notes (Signed)
Patient Name  Quest, Tavenner Legal Sex  Male DOB  February 28, 1976 SSN  [id number] Address  [place] [address] Phone  [phone] [employer])  [phone] (Work)  [phone] (Mobile) *Preferred*    RE: CT Biopsy Received: Today Greggory Keen, MD  Arlyn Leak for CT bx left rib lesion   TS        Previous Messages   ----- Message -----  From: Garth Bigness D  Sent: 04/19/2021  12:32 PM EST  To: [facility] Procedure Requests  Subject: CT Biopsy                                       Procedure:   CT Biopsy   Reason:  Chest wall pain, Metastatic lesion left seventh rib, CT-guided biopsy for surgical pathology, suspect renal cell primary   History:  CT in computer   Provider:  Silverio Decamp   Provider Contact:  [phone]

## 2021-04-19 NOTE — Addendum Note (Signed)
Addended by: Silverio Decamp on: 04/19/2021 10:35 AM   Modules accepted: Orders

## 2021-04-20 ENCOUNTER — Other Ambulatory Visit: Payer: Self-pay

## 2021-04-20 ENCOUNTER — Inpatient Hospital Stay: Payer: 59

## 2021-04-20 ENCOUNTER — Inpatient Hospital Stay: Payer: 59 | Attending: Physician Assistant | Admitting: Physician Assistant

## 2021-04-20 ENCOUNTER — Telehealth: Payer: Self-pay

## 2021-04-20 ENCOUNTER — Encounter: Payer: Self-pay | Admitting: Physician Assistant

## 2021-04-20 VITALS — BP 125/84 | HR 77 | Temp 98.9°F | Resp 18 | Wt 189.2 lb

## 2021-04-20 DIAGNOSIS — R0781 Pleurodynia: Secondary | ICD-10-CM | POA: Insufficient documentation

## 2021-04-20 DIAGNOSIS — N2889 Other specified disorders of kidney and ureter: Secondary | ICD-10-CM

## 2021-04-20 DIAGNOSIS — M899 Disorder of bone, unspecified: Secondary | ICD-10-CM | POA: Insufficient documentation

## 2021-04-20 DIAGNOSIS — F17210 Nicotine dependence, cigarettes, uncomplicated: Secondary | ICD-10-CM | POA: Insufficient documentation

## 2021-04-20 DIAGNOSIS — R0789 Other chest pain: Secondary | ICD-10-CM

## 2021-04-20 DIAGNOSIS — Z809 Family history of malignant neoplasm, unspecified: Secondary | ICD-10-CM | POA: Insufficient documentation

## 2021-04-20 HISTORY — DX: Other specified disorders of kidney and ureter: N28.89

## 2021-04-20 LAB — CBC WITH DIFFERENTIAL (CANCER CENTER ONLY)
Abs Immature Granulocytes: 0.03 10*3/uL (ref 0.00–0.07)
Basophils Absolute: 0.1 10*3/uL (ref 0.0–0.1)
Basophils Relative: 1 %
Eosinophils Absolute: 0.1 10*3/uL (ref 0.0–0.5)
Eosinophils Relative: 1 %
HCT: 45.2 % (ref 39.0–52.0)
Hemoglobin: 15.3 g/dL (ref 13.0–17.0)
Immature Granulocytes: 0 %
Lymphocytes Relative: 24 %
Lymphs Abs: 2.1 10*3/uL (ref 0.7–4.0)
MCH: 30.7 pg (ref 26.0–34.0)
MCHC: 33.8 g/dL (ref 30.0–36.0)
MCV: 90.6 fL (ref 80.0–100.0)
Monocytes Absolute: 0.7 10*3/uL (ref 0.1–1.0)
Monocytes Relative: 8 %
Neutro Abs: 5.9 10*3/uL (ref 1.7–7.7)
Neutrophils Relative %: 66 %
Platelet Count: 260 10*3/uL (ref 150–400)
RBC: 4.99 MIL/uL (ref 4.22–5.81)
RDW: 11.8 % (ref 11.5–15.5)
WBC Count: 8.9 10*3/uL (ref 4.0–10.5)
nRBC: 0 % (ref 0.0–0.2)

## 2021-04-20 LAB — CMP (CANCER CENTER ONLY)
ALT: 24 U/L (ref 0–44)
AST: 24 U/L (ref 15–41)
Albumin: 4.4 g/dL (ref 3.5–5.0)
Alkaline Phosphatase: 61 U/L (ref 38–126)
Anion gap: 5 (ref 5–15)
BUN: 18 mg/dL (ref 6–20)
CO2: 28 mmol/L (ref 22–32)
Calcium: 9.7 mg/dL (ref 8.9–10.3)
Chloride: 104 mmol/L (ref 98–111)
Creatinine: 0.87 mg/dL (ref 0.61–1.24)
GFR, Estimated: >60 mL/min (ref >60)
Glucose, Bld: 89 mg/dL (ref 70–99)
Potassium: 4.2 mmol/L (ref 3.5–5.1)
Sodium: 137 mmol/L (ref 135–145)
Total Bilirubin: 0.8 mg/dL (ref 0.3–1.2)
Total Protein: 7.3 g/dL (ref 6.5–8.1)

## 2021-04-20 LAB — LACTATE DEHYDROGENASE: LDH: 109 U/L (ref 98–192)

## 2021-04-20 MED ORDER — LIDOCAINE 5 % EX PTCH: 1.0000 | MEDICATED_PATCH | CUTANEOUS | 0 refills | Status: DC

## 2021-04-20 MED ORDER — TRAMADOL HCL 50 MG PO TABS: 50.0000 mg | ORAL_TABLET | Freq: Four times a day (QID) | ORAL | 0 refills | Status: DC | PRN

## 2021-04-20 NOTE — Progress Notes (Signed)
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   HISTORY OF PRESENTING ILLNESS:  Gordon Mcclain 46 y.o. male presents for initial evaluation after abnormal CT scan of the chest. Gordon Mcclain presented with left-sided rib pain intermittently since August 2022.  He presented to the emergency on 11/22/2020 where chest x-ray and EKG were done, both unremarkable.  He underwent CT imaging of the chest on 04/18/2021.  Findings revealed large upper pole left renal mass, possible perirenal adenopathy and left seventh rib metastasis.   On exam today, Gordon Mcclain reports that his energy levels are fairly stable.  He is less active due to ongoing left-sided rib pain.  He reports that the pain comes and goes but is aggravated with coughing and deep inspiration. He current takes two tablets of extra strength Tylenol twice a day. He reports that the pain is under control during the day but pain is impacting his sleep.  He denies any nausea, vomiting or abdominal pain.  His bowel habits are regular without any diarrhea or constipation.  He denies any fevers, chills, night sweats, shortness of breath, chest pain or cough.  He has no other complaints.  Remaining 10 point ROS is below.  MEDICAL HISTORY:  Past Medical History:  Diagnosis Date   Frequent headaches    occipital region mostly: takes ibuprofen 800 mg bid most days   Tobacco dependence     SURGICAL HISTORY: Past Surgical History:  Procedure Laterality Date   PILONIDAL CYST EXCISION  approx 2002    SOCIAL HISTORY: Social History   Socioeconomic History   Marital status: Married    Spouse name: Not on file   Number of children: Not on  file   Years of education: Not on file   Highest education level: Not on file  Occupational History   Not on file  Tobacco Use   Smoking status: Every Day    Packs/day: 1.00    Years: 20.00    Pack years: 20.00    Types: Cigarettes   Smokeless tobacco: Never   Tobacco comments:    Decreased to 1-2 cigarettes a day.   Vaping Use   Vaping Use: Never used  Substance and Sexual Activity   Alcohol use: No   Drug use: No   Sexual activity: Not on file  Other Topics Concern   Not on file  Social History Narrative   Married, 1 daughter and one son.   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   Orig from [place], [place] area.   Education: HS.   Tob 20  pck-yr hx (current as of 03/2014).   Alc: none.  No hx of drug use.   Caffeine: 3-4 cups coffee/day, 1 cup tea per day.   No exercise.   Social Determinants of Health   Financial Resource Strain: Not on file  Food Insecurity: Not on file  Transportation Needs: Not on file  Physical Activity: Not on file  Stress: Not on file  Social Connections: Not on file  Intimate Partner Violence: Not on file    FAMILY HISTORY: Family History  Problem Relation Age of Onset   Arthritis Mother    Cancer Maternal Aunt    Heart attack Maternal Uncle    Heart  attack Maternal Grandmother    Heart attack Maternal Grandfather    Heart attack Maternal Aunt     ALLERGIES:  has No Known Allergies.  MEDICATIONS:  Current Outpatient Medications  Medication Sig Dispense Refill   acetaminophen (TYLENOL) 325 MG tablet Take 650 mg by mouth every 6 (six) hours as needed.     ibuprofen (ADVIL) 800 MG tablet Take by mouth.     lidocaine (LIDODERM) 5 % Place 1 patch onto the skin daily. Remove & Discard patch within 12 hours or as directed by MD 30 patch 0   traMADol (ULTRAM) 50 MG tablet Take 1 tablet (50 mg total) by mouth every 6 (six) hours as needed for moderate pain. 30 tablet 0   cyclobenzaprine (FLEXERIL) 10  MG tablet Take by mouth. (Patient not taking: Reported on 12/01/2020)     predniSONE (DELTASONE) 50 MG tablet One tab PO daily for 5 days. (Patient not taking: Reported on 04/20/2021) 5 tablet 0   No current facility-administered medications for this visit.    REVIEW OF SYSTEMS:   Constitutional: ( - ) fevers, ( - )  chills , ( - ) night sweats Eyes: ( - ) blurriness of vision, ( - ) double vision, ( - ) watery eyes Ears, nose, mouth, throat, and face: ( - ) mucositis, ( - ) sore throat Respiratory: ( - ) cough, ( - ) dyspnea, ( - ) wheezes Cardiovascular: ( - ) palpitation, ( - ) chest discomfort, ( - ) lower extremity swelling Gastrointestinal:  ( - ) nausea, ( - ) heartburn, ( - ) change in bowel habits Skin: ( - ) abnormal skin rashes Lymphatics: ( - ) new lymphadenopathy, ( - ) easy bruising Neurological: ( - ) numbness, ( - ) tingling, ( - ) new weaknesses Behavioral/Psych: ( - ) mood change, ( - ) new changes  All other systems were reviewed with the patient and are negative.  PHYSICAL EXAMINATION: ECOG PERFORMANCE STATUS: 1 - Symptomatic but completely ambulatory  Vitals:   04/20/21 0912  BP: 125/84  Pulse: 77  Resp: 18  Temp: 98.9 F (37.2 C)  SpO2: 98%   Filed Weights   04/20/21 0912  Weight: 189 lb 3 oz (85.8 kg)    GENERAL: well appearing male in NAD  SKIN: skin color, texture, turgor are normal, no rashes or significant lesions EYES: conjunctiva are pink and non-injected, sclera clear OROPHARYNX: no exudate, no erythema; lips, buccal mucosa, and tongue normal  NECK: supple, non-tender LYMPH:  no palpable lymphadenopathy in the cervical, axillary or supraclavicular lymph nodes.  LUNGS: clear to auscultation and percussion with normal breathing effort HEART: regular rate & rhythm and no murmurs and no lower extremity edema ABDOMEN: soft, non-tender, non-distended, normal bowel sounds Musculoskeletal: no cyanosis of digits and no clubbing  PSYCH: alert & oriented  x 3, fluent speech NEURO: no focal motor/sensory deficits  LABORATORY DATA:  I have reviewed the data as listed CBC Latest Ref Rng & Units 04/20/2021 04/08/2021 11/22/2020  WBC 4.0 - 10.5 K/uL 8.9 6.9 7.9  Hemoglobin 13.0 - 17.0 g/dL 15.3 14.8 15.3  Hematocrit 39.0 - 52.0 % 45.2 44.6 46.3  Platelets 150 - 400 K/uL 260 222.0 235    CMP Latest Ref Rng & Units 04/20/2021 04/08/2021 11/22/2020  Glucose 70 - 99 mg/dL 89 81 98  BUN 6 - 20 mg/dL 18 13 15   Creatinine 0.61 - 1.24 mg/dL 0.87 0.81 0.98  Sodium 135 - 145 mmol/L 137 139  137  Potassium 3.5 - 5.1 mmol/L 4.2 4.4 3.7  Chloride 98 - 111 mmol/L 104 104 101  CO2 22 - 32 mmol/L 28 27 27   Calcium 8.9 - 10.3 mg/dL 9.7 9.6 9.1  Total Protein 6.5 - 8.1 g/dL 7.3 6.6 6.5  Total Bilirubin 0.3 - 1.2 mg/dL 0.8 0.7 0.8  Alkaline Phos 38 - 126 U/L 61 58 57  AST 15 - 41 U/L 24 20 22   ALT 0 - 44 U/L 24 19 18      RADIOGRAPHIC STUDIES: I have personally reviewed the radiological images as listed and agreed with the findings in the report. DG Chest 2 View  Result Date: 03/25/2021 CLINICAL DATA:  Intermittent pain under the left breast since September. Some associated cough and shortness of breath. EXAM: CHEST - 2 VIEW COMPARISON:  November 22, 2020. FINDINGS: The heart size and mediastinal contours are within normal limits. Both lungs are clear. The visualized skeletal structures are unremarkable. IMPRESSION: No active cardiopulmonary disease. Electronically Signed   By: Titus Dubin M.D.   On: 03/25/2021 09:31   CT Chest Wo Contrast  Result Date: 04/18/2021 CLINICAL DATA:  Persistent left lower posterior chest wall pain radiating around to the anterior left lower chest since August 2022 when the patient had blunt chest wall trauma. Popping sensation when taking deep breaths. EXAM: CT CHEST WITHOUT CONTRAST TECHNIQUE: Multidetector CT imaging of the chest was performed following the standard protocol without IV contrast. RADIATION DOSE REDUCTION: This  exam was performed according to the departmental dose-optimization program which includes automated exposure control, adjustment of the mA and/or kV according to patient size and/or use of iterative reconstruction technique. COMPARISON:  Chest and left rib radiographs dated 11/22/2020 and chest radiographs dated 03/25/2021. FINDINGS: Cardiovascular: No significant vascular findings. Normal heart size. No pericardial effusion. Mediastinum/Nodes: No enlarged mediastinal or axillary lymph nodes. Thyroid gland, trachea, and esophagus demonstrate no significant findings. Lungs/Pleura: Lungs are clear. No pleural effusion or pneumothorax. Upper Abdomen: Partially included large upper pole left renal mass measuring 7.7 x 7.1 cm on image number 147/2. This contains a small peripheral calcification medially. There are several mildly enlarged perirenal and pararenal lymph nodes medially on the left, the largest measuring 8 mm in short axis diameter on image number 139/2. There is also a probable accessory splenule measuring 11 mm in short axis diameter on image number 137/2. An additional mildly enlarged lymph node is less likely. The adrenal glands have normal appearances. Musculoskeletal: There is a lytic mass in the lateral aspect of the left 7th rib, measuring 3.5 x 2.2 cm on image number 86/2, protruding into the adjacent external pleural region. IMPRESSION: Partially included large upper pole left renal mass with an associated left 7th rib metastasis and possible left perirenal/pararenal metastatic adenopathy. Further evaluation with a chest and abdomen CT or MRI without and with contrast is recommended. These results will be called to the ordering clinician or representative by the Radiologist Assistant, and communication documented in the PACS or [employer]. Electronically Signed   By: Claudie Revering M.D.   On: 04/18/2021 15:07    ASSESSMENT & PLAN Gordon Mcclain is a 46 y.o. male who presents to the  diagnostic clinic due to recent abnormal CT scan. We reviewed the CT scan in detail that revealed a large left renal mass and a left rib lesion concerning for metastatic renal cell carcinoma.   Patient is scheduled for CT guided biopsy of the rib lesion on 04/28/2021. We recommend to  complete staging with CT imaging of the abdomen and pelvis. Patient will obtain baseline laboratory studies today. Additionally, we will request a consultation with [facility] to address any surgical interventions for renal mass. If there is evidence of metastatic disease, we will arrange a follow up with a medical oncologist to discuss systemic therapies.   #Left renal mass with left rib lesion: --Seen on CT chest from 04/08/2021. --Scheduled for CT guided biopsy of 7th left rib on 04/28/2021 --Need CT abdomen/pelvis to complete staging --Referral to [facility] placed today --Labs today to check CBC, CMP and LDH levels --RTC after above workup is complete  #Left rib pain: --Likely secondary to 7th rib lesion. --Currently taking 2 Tylenol 650 mg twice daily with moderate control of pain --Sent prescription of Tramadol 50 mg q 6 hours as needed. Additionally, send lidocaine patch for local control.  --Consider palliative radiation once malignancy is confirmed.   Orders Placed This Encounter  Procedures   CT Abdomen Pelvis W Contrast    Standing Status:   Future    Standing Expiration Date:   04/20/2022    Order Specific Question:   If indicated for the ordered procedure, I authorize the administration of contrast media per Radiology protocol    Answer:   Yes    Order Specific Question:   Preferred imaging location?    Answer:   [facility]    Order Specific Question:   Is Oral Contrast requested for this exam?    Answer:   Yes, Per Radiology protocol   CBC with Differential ([place] Only)    Standing Status:   Future    Number of Occurrences:   1    Standing Expiration Date:    04/20/2022   CMP ([place] only)    Standing Status:   Future    Number of Occurrences:   1    Standing Expiration Date:   04/20/2022   Lactate dehydrogenase (LDH)    Standing Status:   Future    Number of Occurrences:   1    Standing Expiration Date:   04/20/2022   Ambulatory referral to Urology    Referral Priority:   Urgent    Referral Type:   Consultation    Referral Reason:   Specialty Services Required    Requested Specialty:   Urology    Number of Visits Requested:   1    All questions were answered. The patient knows to call the clinic with any problems, questions or concerns.  I have spent a total of 60 minutes minutes of face-to-face and non-face-to-face time, preparing to see the patient, obtaining and/or reviewing separately obtained history, performing a medically appropriate examination, counseling and educating the patient, ordering medications/tests, referring and communicating with other health care professionals, documenting clinical information in the electronic health record, and care coordination.   Dede Query, PA-C Department of Hematology/Oncology [place] at [facility] Phone: [phone]  I have read the above note and personally examined the patient. I agree with the assessment and plan as noted above.  Briefly Gordon Mcclain is a 46 year old male who presents for evaluation of concerning findings on CT scan.  A CT scan of the chest when he was having persistent left chest pain and was found to have what appears to be a metastatic lesion in the rib.  Additionally he was found to have a 7 cm mass in his left kidney.  At this time findings  are most concerning for metastatic renal cell carcinoma.  At this time would recommend a CT abdomen pelvis in order to complete staging.  A biopsy is already been ordered on 04/28/2021 of the rib lesion.  In the event the patient is found to have renal cell carcinoma would recommend transition  of care to Dr. Alen Blew in our practice.  Also offer the patient tramadol for pain control though he notes he would like to try Tylenol alone for the time being.  We prescribed the Tylenol so that is available for breakthrough pain.   Ledell Peoples, MD Department of Hematology/Oncology [place] at [facility] Phone: [phone] Pager: [phone] Email: [email or web address]

## 2021-04-20 NOTE — Telephone Encounter (Signed)
Please reach out to our contact in [facility] to get an urgent consultation     Request for Stat referral faxed to [facility].  confirmation received

## 2021-04-22 ENCOUNTER — Other Ambulatory Visit: Payer: Self-pay | Admitting: Physician Assistant

## 2021-04-22 ENCOUNTER — Ambulatory Visit (HOSPITAL_COMMUNITY): Admission: RE | Admit: 2021-04-22 | Payer: 59 | Source: Ambulatory Visit

## 2021-04-22 DIAGNOSIS — N2889 Other specified disorders of kidney and ureter: Secondary | ICD-10-CM

## 2021-04-26 ENCOUNTER — Telehealth: Payer: Self-pay

## 2021-04-26 NOTE — Telephone Encounter (Signed)
Leah with [facility] called with an authorization for the following:  CT Abdomen/Pelvis Auth: [id number] from 04/22/21 - 10/19/21 At [facility]

## 2021-04-27 ENCOUNTER — Other Ambulatory Visit: Payer: Self-pay | Admitting: Radiology

## 2021-04-28 ENCOUNTER — Other Ambulatory Visit: Payer: Self-pay

## 2021-04-28 ENCOUNTER — Ambulatory Visit (HOSPITAL_COMMUNITY)
Admission: RE | Admit: 2021-04-28 | Discharge: 2021-04-28 | Disposition: A | Discharge status: Home / Self Care | Payer: 59 | Source: Ambulatory Visit | Attending: Sports Medicine | Admitting: Sports Medicine

## 2021-04-28 DIAGNOSIS — C801 Malignant (primary) neoplasm, unspecified: Secondary | ICD-10-CM | POA: Insufficient documentation

## 2021-04-28 DIAGNOSIS — C7951 Secondary malignant neoplasm of bone: Secondary | ICD-10-CM | POA: Insufficient documentation

## 2021-04-28 DIAGNOSIS — R599 Enlarged lymph nodes, unspecified: Secondary | ICD-10-CM | POA: Insufficient documentation

## 2021-04-28 DIAGNOSIS — R0789 Other chest pain: Secondary | ICD-10-CM | POA: Diagnosis present

## 2021-04-28 DIAGNOSIS — N2889 Other specified disorders of kidney and ureter: Secondary | ICD-10-CM | POA: Insufficient documentation

## 2021-04-28 DIAGNOSIS — M899 Disorder of bone, unspecified: Secondary | ICD-10-CM | POA: Diagnosis present

## 2021-04-28 DIAGNOSIS — Z87891 Personal history of nicotine dependence: Secondary | ICD-10-CM | POA: Diagnosis not present

## 2021-04-28 LAB — CBC
HCT: 43.8 % (ref 39.0–52.0)
Hemoglobin: 14.7 g/dL (ref 13.0–17.0)
MCH: 30.5 pg (ref 26.0–34.0)
MCHC: 33.6 g/dL (ref 30.0–36.0)
MCV: 90.9 fL (ref 80.0–100.0)
Platelets: 281 10*3/uL (ref 150–400)
RBC: 4.82 MIL/uL (ref 4.22–5.81)
RDW: 11.7 % (ref 11.5–15.5)
WBC: 6.7 10*3/uL (ref 4.0–10.5)
nRBC: 0 % (ref 0.0–0.2)

## 2021-04-28 LAB — PROTIME-INR
INR: 1 (ref 0.8–1.2)
Prothrombin Time: 12.9 seconds (ref 11.4–15.2)

## 2021-04-28 MED ORDER — FENTANYL CITRATE (PF) 100 MCG/2ML IJ SOLN
INTRAMUSCULAR | Status: AC
  Filled 2021-04-28: qty 2

## 2021-04-28 MED ORDER — MIDAZOLAM HCL 2 MG/2ML IJ SOLN
INTRAMUSCULAR | Status: AC | PRN
Start: 2021-04-28 — End: 2021-04-28
  Administered 2021-04-28 (×2): 1 mg via INTRAVENOUS

## 2021-04-28 MED ORDER — MIDAZOLAM HCL 2 MG/2ML IJ SOLN
INTRAMUSCULAR | Status: AC
  Filled 2021-04-28: qty 2

## 2021-04-28 MED ORDER — LIDOCAINE HCL 1 % IJ SOLN
INTRAMUSCULAR | Status: AC
  Filled 2021-04-28: qty 10

## 2021-04-28 MED ORDER — SODIUM CHLORIDE 0.9 % IV SOLN: INTRAVENOUS | Status: DC

## 2021-04-28 MED ORDER — HYDROCODONE-ACETAMINOPHEN 5-325 MG PO TABS: 1.0000 | ORAL_TABLET | ORAL | Status: DC | PRN

## 2021-04-28 MED ORDER — FENTANYL CITRATE (PF) 100 MCG/2ML IJ SOLN
INTRAMUSCULAR | Status: AC | PRN
  Administered 2021-04-28: 50 ug via INTRAVENOUS
  Administered 2021-04-28: 25 ug via INTRAVENOUS

## 2021-04-28 NOTE — Consult Note (Signed)
Chief Complaint: Patient was seen in consultation today for CT guided biopsy of left seventh rib lesion  Referring Physician(s): Silverio Decamp  Supervising Physician: Arne Cleveland  Patient Status: [facility] - Out-pt  History of Present Illness: Gordon Mcclain is a 46 y.o. male ex smoker with persistent left lateral chest discomfort following an episode of sneezing approximately 6 months ago.  Chest/rib film at that time revealed no evidence of fracture.  Due to persistence of pain patient underwent CT chest on 04/18/2021 which revealed: Partially included large upper pole left renal mass with an associated left 7th rib metastasis and possible left perirenal/pararenal metastatic adenopathy.   He presents today for CT-guided left seventh rib lesion biopsy.  He is scheduled to undergo CT of abdomen and pelvis tomorrow.  He has no prior history of cancer or other significant medical problems.  Past Medical History:  Diagnosis Date   Frequent headaches    occipital region mostly: takes ibuprofen 800 mg bid most days   Tobacco dependence     Past Surgical History:  Procedure Laterality Date   PILONIDAL CYST EXCISION  approx 2002    Allergies: Patient has no known allergies.  Medications: Prior to Admission medications   Medication Sig Start Date End Date Taking? Authorizing Provider  acetaminophen (TYLENOL) 325 MG tablet Take 650 mg by mouth every 6 (six) hours as needed.    [provider]  cyclobenzaprine (FLEXERIL) 10 MG tablet Take by mouth. Patient not taking: Reported on 12/01/2020 11/22/20   [provider]  ibuprofen (ADVIL) 800 MG tablet Take by mouth. 11/22/20   [provider]  lidocaine (LIDODERM) 5 % Place 1 patch onto the skin daily. Remove & Discard patch within 12 hours or as directed by MD 04/20/21   Lincoln Brigham, PA-C  predniSONE (DELTASONE) 50 MG tablet One tab PO daily for 5 days. Patient not taking: Reported on  04/20/2021 04/18/21   Silverio Decamp, MD  traMADol (ULTRAM) 50 MG tablet Take 1 tablet (50 mg total) by mouth every 6 (six) hours as needed for moderate pain. 04/20/21   Lincoln Brigham, PA-C     Family History  Problem Relation Age of Onset   Arthritis Mother    Cancer Maternal Aunt    Heart attack Maternal Uncle    Heart attack Maternal Grandmother    Heart attack Maternal Grandfather    Heart attack Maternal Aunt     Social History   Socioeconomic History   Marital status: Married    Spouse name: Not on file   Number of children: Not on file   Years of education: Not on file   Highest education level: Not on file  Occupational History   Not on file  Tobacco Use   Smoking status: Every Day    Packs/day: 1.00    Years: 20.00    Pack years: 20.00    Types: Cigarettes   Smokeless tobacco: Never   Tobacco comments:    Decreased to 1-2 cigarettes a day.   Vaping Use   Vaping Use: Never used  Substance and Sexual Activity   Alcohol use: No   Drug use: No   Sexual activity: Not on file  Other Topics Concern   Not on file  Social History Narrative   Married, 1 daughter and one son.   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   Education: HS.   Tob 20  pck-yr hx (current  as of 03/2014).   Alc: none.  No hx of drug use.   Caffeine: 3-4 cups coffee/day, 1 cup tea per day.   No exercise.   Social Determinants of Health   Financial Resource Strain: Not on file  Food Insecurity: Not on file  Transportation Needs: Not on file  Physical Activity: Not on file  Stress: Not on file  Social Connections: Not on file     Review of Systems currently denies fever, headache, dyspnea, cough, abdominal pain, back pain, nausea, vomiting or bleeding.  Does have  left lateral chest discomfort.  Vital Signs: BP 127/86    Pulse 84    Temp 98.6 F (37 C) (Oral)    Resp 16    Ht 5\' 5"  (1.651 m)    Wt 189 lb (85.7  kg)    SpO2 100%    BMI 31.45 kg/m   Physical Exam awake, alert.  Chest clear to auscultation bilaterally.  Heart with regular rate and rhythm.  Abdomen soft, positive bowel sounds, nontender.  No lower extremity edema.  Imaging: CT Chest Wo Contrast  Result Date: 04/18/2021 CLINICAL DATA:  Persistent left lower posterior chest wall pain radiating around to the anterior left lower chest since August 2022 when the patient had blunt chest wall trauma. Popping sensation when taking deep breaths. EXAM: CT CHEST WITHOUT CONTRAST TECHNIQUE: Multidetector CT imaging of the chest was performed following the standard protocol without IV contrast. RADIATION DOSE REDUCTION: This exam was performed according to the departmental dose-optimization program which includes automated exposure control, adjustment of the mA and/or kV according to patient size and/or use of iterative reconstruction technique. COMPARISON:  Chest and left rib radiographs dated 11/22/2020 and chest radiographs dated 03/25/2021. FINDINGS: Cardiovascular: No significant vascular findings. Normal heart size. No pericardial effusion. Mediastinum/Nodes: No enlarged mediastinal or axillary lymph nodes. Thyroid gland, trachea, and esophagus demonstrate no significant findings. Lungs/Pleura: Lungs are clear. No pleural effusion or pneumothorax. Upper Abdomen: Partially included large upper pole left renal mass measuring 7.7 x 7.1 cm on image number 147/2. This contains a small peripheral calcification medially. There are several mildly enlarged perirenal and pararenal lymph nodes medially on the left, the largest measuring 8 mm in short axis diameter on image number 139/2. There is also a probable accessory splenule measuring 11 mm in short axis diameter on image number 137/2. An additional mildly enlarged lymph node is less likely. The adrenal glands have normal appearances. Musculoskeletal: There is a lytic mass in the lateral aspect of the left 7th rib,  measuring 3.5 x 2.2 cm on image number 86/2, protruding into the adjacent external pleural region. IMPRESSION: Partially included large upper pole left renal mass with an associated left 7th rib metastasis and possible left perirenal/pararenal metastatic adenopathy. Further evaluation with a chest and abdomen CT or MRI without and with contrast is recommended. These results will be called to the ordering clinician or representative by the Radiologist Assistant, and communication documented in the PACS or [employer]. Electronically Signed   By: Claudie Revering M.D.   On: 04/18/2021 15:07    Labs:  CBC: Recent Labs    11/22/20 0755 04/08/21 0946 04/20/21 1027  WBC 7.9 6.9 8.9  HGB 15.3 14.8 15.3  HCT 46.3 44.6 45.2  PLT 235 222.0 260    COAGS: No results for input(s): INR, APTT in the last 8760 hours.  BMP: Recent Labs    11/22/20 0755 04/08/21 0946 04/20/21 1027  NA 137 139 137  K 3.7 4.4 4.2  CL 101 104 104  CO2 27 27 28   GLUCOSE 98 81 89  BUN 15 13 18   CALCIUM 9.1 9.6 9.7  CREATININE 0.98 0.81 0.87  GFRNONAA >60  --  >60    LIVER FUNCTION TESTS: Recent Labs    11/22/20 0755 04/08/21 0946 04/20/21 1027  BILITOT 0.8 0.7 0.8  AST 22 20 24   ALT 18 19 24   ALKPHOS 57 58 61  PROT 6.5 6.6 7.3  ALBUMIN 3.7 4.3 4.4    TUMOR MARKERS: No results for input(s): AFPTM, CEA, CA199, CHROMGRNA in the last 8760 hours.  Assessment and Plan: 46 y.o. male ex smoker with persistent left lateral chest discomfort following an episode of sneezing approximately 6 months ago.  Chest/rib film at that time revealed no evidence of fracture.  Due to persistence of pain patient underwent CT chest on 04/18/2021 which revealed: Partially included large upper pole left renal mass with an associated left 7th rib metastasis and possible left perirenal/pararenal metastatic adenopathy.   He presents today for CT-guided left seventh rib lesion biopsy.  He is scheduled to undergo CT of abdomen  and pelvis tomorrow.  He has no prior history of cancer or other significant medical problems.Risks and benefits of procedure was discussed with the patient  including, but not limited to bleeding, infection, damage to adjacent structures or low yield requiring additional tests.  All of the questions were answered and there is agreement to proceed.  Consent signed and in chart.    Thank you for this interesting consult.  I greatly enjoyed meeting NIGEL ERICSSON and look forward to participating in their care.  A copy of this report was sent to the requesting provider on this date.  Electronically Signed: D. Rowe Robert, PA-C 04/28/2021, 8:42 AM   I spent a total of  25 minutes   in face to face in clinical consultation, greater than 50% of which was counseling/coordinating care for CT-guided left seventh rib lesion biopsy

## 2021-04-28 NOTE — Procedures (Signed)
°  Procedure: CT guided core biopsy Left 7th rib lesion   EBL:   minimal Complications:  none immediate  See full dictation in [employer].  Dillard Cannon MD Main # [phone] Pager  [phone] Mobile [phone]

## 2021-04-29 ENCOUNTER — Ambulatory Visit (HOSPITAL_COMMUNITY)
Admission: RE | Admit: 2021-04-29 | Discharge: 2021-04-29 | Disposition: A | Discharge status: Home / Self Care | Payer: 59 | Source: Ambulatory Visit | Attending: Physician Assistant | Admitting: Physician Assistant

## 2021-04-29 ENCOUNTER — Encounter (HOSPITAL_COMMUNITY): Payer: Self-pay

## 2021-04-29 ENCOUNTER — Ambulatory Visit (HOSPITAL_COMMUNITY): Payer: 59

## 2021-04-29 DIAGNOSIS — N2889 Other specified disorders of kidney and ureter: Secondary | ICD-10-CM | POA: Insufficient documentation

## 2021-04-29 MED ORDER — IOHEXOL 300 MG/ML  SOLN
100.0000 mL | Freq: Once | INTRAMUSCULAR | Status: AC | PRN
  Administered 2021-04-29: 100 mL via INTRAVENOUS

## 2021-04-29 MED ORDER — SODIUM CHLORIDE (PF) 0.9 % IJ SOLN
INTRAMUSCULAR | Status: AC
  Filled 2021-04-29: qty 50

## 2021-05-02 ENCOUNTER — Telehealth: Payer: Self-pay | Admitting: Physician Assistant

## 2021-05-02 DIAGNOSIS — C649 Malignant neoplasm of unspecified kidney, except renal pelvis: Secondary | ICD-10-CM

## 2021-05-02 LAB — SURGICAL PATHOLOGY

## 2021-05-02 NOTE — Telephone Encounter (Addendum)
I called Mr. Gordon Mcclain to review biopsy results and CT scan results. Next steps will include follow up visit with medical oncologist, Dr. Alen Blew, to discuss treatment recommendations. Additionally, we will make referral to radiation oncology to evaluate for palliative radiation to 7th rib metastasis and a referral to orthopedics for femoral neck metastasis.

## 2021-05-03 ENCOUNTER — Telehealth: Payer: Self-pay

## 2021-05-03 ENCOUNTER — Telehealth: Payer: Self-pay | Admitting: Oncology

## 2021-05-03 NOTE — Telephone Encounter (Signed)
Scheduled pt for new pt appt with Dr. Alen Blew per 2/6 secure chat with MD and Murray Hodgkins. Spoke to pt who is aware of appt date and time. Also informed pt that an ortho referral had been placed and he should be on the look out for a call to sch an appt from them. Pt verbalized his understanding.

## 2021-05-03 NOTE — Telephone Encounter (Signed)
Please send referral to Radiation Oncology and [facility].  Per Irene's request referrals were faxed and confirmations received.

## 2021-05-04 ENCOUNTER — Emergency Department (HOSPITAL_COMMUNITY)
Admission: EM | Admit: 2021-05-04 | Discharge: 2021-05-04 | Disposition: A | Discharge status: Home / Self Care | Payer: 59 | Attending: Emergency Medicine | Admitting: Emergency Medicine

## 2021-05-04 ENCOUNTER — Emergency Department (HOSPITAL_COMMUNITY): Payer: 59

## 2021-05-04 ENCOUNTER — Other Ambulatory Visit: Payer: Self-pay

## 2021-05-04 ENCOUNTER — Encounter: Payer: Self-pay | Admitting: Sports Medicine

## 2021-05-04 ENCOUNTER — Ambulatory Visit (HOSPITAL_COMMUNITY): Payer: 59

## 2021-05-04 ENCOUNTER — Encounter (HOSPITAL_COMMUNITY): Payer: Self-pay | Admitting: Emergency Medicine

## 2021-05-04 DIAGNOSIS — R002 Palpitations: Secondary | ICD-10-CM

## 2021-05-04 DIAGNOSIS — R0602 Shortness of breath: Secondary | ICD-10-CM | POA: Insufficient documentation

## 2021-05-04 HISTORY — DX: Malignant neoplasm of unspecified kidney, except renal pelvis: C64.9

## 2021-05-04 LAB — CBC WITH DIFFERENTIAL/PLATELET
Abs Immature Granulocytes: 0.04 10*3/uL (ref 0.00–0.07)
Basophils Absolute: 0.1 10*3/uL (ref 0.0–0.1)
Basophils Relative: 1 %
Eosinophils Absolute: 0.1 10*3/uL (ref 0.0–0.5)
Eosinophils Relative: 1 %
HCT: 42.6 % (ref 39.0–52.0)
Hemoglobin: 14.5 g/dL (ref 13.0–17.0)
Immature Granulocytes: 1 %
Lymphocytes Relative: 16 %
Lymphs Abs: 1.4 10*3/uL (ref 0.7–4.0)
MCH: 31.4 pg (ref 26.0–34.0)
MCHC: 34 g/dL (ref 30.0–36.0)
MCV: 92.2 fL (ref 80.0–100.0)
Monocytes Absolute: 0.6 10*3/uL (ref 0.1–1.0)
Monocytes Relative: 7 %
Neutro Abs: 6.4 10*3/uL (ref 1.7–7.7)
Neutrophils Relative %: 74 %
Platelets: 263 10*3/uL (ref 150–400)
RBC: 4.62 MIL/uL (ref 4.22–5.81)
RDW: 12 % (ref 11.5–15.5)
WBC: 8.6 10*3/uL (ref 4.0–10.5)
nRBC: 0 % (ref 0.0–0.2)

## 2021-05-04 LAB — COMPREHENSIVE METABOLIC PANEL
ALT: 33 U/L (ref 0–44)
AST: 29 U/L (ref 15–41)
Albumin: 4.3 g/dL (ref 3.5–5.0)
Alkaline Phosphatase: 51 U/L (ref 38–126)
Anion gap: 5 (ref 5–15)
BUN: 18 mg/dL (ref 6–20)
CO2: 27 mmol/L (ref 22–32)
Calcium: 9.2 mg/dL (ref 8.9–10.3)
Chloride: 102 mmol/L (ref 98–111)
Creatinine, Ser: 0.71 mg/dL (ref 0.61–1.24)
GFR, Estimated: >60 mL/min (ref >60)
Glucose, Bld: 95 mg/dL (ref 70–99)
Potassium: 3.8 mmol/L (ref 3.5–5.1)
Sodium: 134 mmol/L — ABNORMAL LOW (ref 135–145)
Total Bilirubin: 0.6 mg/dL (ref 0.3–1.2)
Total Protein: 7.1 g/dL (ref 6.5–8.1)

## 2021-05-04 LAB — TROPONIN I (HIGH SENSITIVITY)
Troponin I (High Sensitivity): <2 ng/L (ref <18)
Troponin I (High Sensitivity): <2 ng/L (ref <18)

## 2021-05-04 LAB — MAGNESIUM: Magnesium: 2.2 mg/dL (ref 1.7–2.4)

## 2021-05-04 MED ORDER — IOHEXOL 350 MG/ML SOLN
80.0000 mL | Freq: Once | INTRAVENOUS | Status: AC | PRN
  Administered 2021-05-04: 80 mL via INTRAVENOUS

## 2021-05-04 MED ORDER — SODIUM CHLORIDE (PF) 0.9 % IJ SOLN
INTRAMUSCULAR | Status: AC
  Filled 2021-05-04: qty 50

## 2021-05-04 MED ORDER — ASPIRIN 81 MG PO CHEW
324.0000 mg | CHEWABLE_TABLET | Freq: Once | ORAL | Status: AC
  Administered 2021-05-04: 324 mg via ORAL
  Filled 2021-05-04: qty 4

## 2021-05-04 MED ORDER — SODIUM CHLORIDE 0.9 % IV SOLN: INTRAVENOUS | Status: DC

## 2021-05-04 NOTE — ED Notes (Signed)
An After Visit Summary was printed and given to the patient. Discharge instructions given and no further questions at this time.  Pt leaving with his wife. Pt denies SOB, denies chest pain.

## 2021-05-04 NOTE — Telephone Encounter (Signed)
Looks like he is currently in ED.

## 2021-05-04 NOTE — Discharge Instructions (Addendum)
As discussed, your evaluation today has been largely reassuring.  But, it is important that you monitor your condition carefully, and do not hesitate to return to the ED if you develop new, or concerning changes in your condition. ? ?Otherwise, please follow-up with your physician for appropriate ongoing care. ? ?

## 2021-05-04 NOTE — ED Provider Notes (Signed)
[facility] DEPT Provider Note   CSN: [id number] Arrival date & time: 05/04/21  0830     History  Chief Complaint  Patient presents with   Palpitations    Gordon Mcclain is a 46 y.o. male.  HPI Adult male presents with his wife who assists with history.  Patient was generally well until recent diagnosis of metastatic renal cell malignancy.  He presents today with concern for palpitations, tachycardia, fatigue and dyspnea.  Though he has had episodes going back months, over the past few days he has had more pronounced symptoms, with inability to perform previously unremarkable ADL without dyspnea, palpitations, discomfort. Strong family history of cardiac disease, but none for him individually.  Patient works in [employer].    Home Medications Prior to Admission medications   Medication Sig Start Date End Date Taking? Authorizing Provider  acetaminophen (TYLENOL) 325 MG tablet Take 650 mg by mouth every 6 (six) hours as needed.    [provider]  cyclobenzaprine (FLEXERIL) 10 MG tablet Take by mouth. Patient not taking: Reported on 12/01/2020 11/22/20   [provider]  ibuprofen (ADVIL) 800 MG tablet Take by mouth. 11/22/20   [provider]  lidocaine (LIDODERM) 5 % Place 1 patch onto the skin daily. Remove & Discard patch within 12 hours or as directed by MD 04/20/21   Lincoln Brigham, PA-C  predniSONE (DELTASONE) 50 MG tablet One tab PO daily for 5 days. Patient not taking: Reported on 04/20/2021 04/18/21   Silverio Decamp, MD  traMADol (ULTRAM) 50 MG tablet Take 1 tablet (50 mg total) by mouth every 6 (six) hours as needed for moderate pain. 04/20/21   Lincoln Brigham, PA-C      Allergies    Patient has no known allergies.    Review of Systems   Review of Systems  Constitutional:        Per HPI, otherwise negative  HENT:         Per HPI, otherwise negative  Respiratory:         Per HPI, otherwise negative   Cardiovascular:        Per HPI, otherwise negative  Gastrointestinal:  Negative for vomiting.  Endocrine:       Negative aside from HPI  Genitourinary:        Neg aside from HPI   Musculoskeletal:        Per HPI, otherwise negative  Skin: Negative.   Neurological:  Negative for syncope.  Hematological:        Malignancy, no treatment thus far   Physical Exam Updated Vital Signs BP 128/87    Pulse 69    Temp 97.9 F (36.6 C) (Oral)    Resp 10    Ht 5\' 5"  (1.651 m)    SpO2 97%    BMI 31.45 kg/m  Physical Exam Vitals and nursing note reviewed.  Constitutional:      General: He is not in acute distress.    Appearance: He is well-developed.  HENT:     Head: Normocephalic and atraumatic.  Eyes:     Conjunctiva/sclera: Conjunctivae normal.  Cardiovascular:     Rate and Rhythm: Normal rate and regular rhythm.  Pulmonary:     Effort: Pulmonary effort is normal. No respiratory distress.     Breath sounds: No stridor.  Abdominal:     General: There is no distension.  Skin:    General: Skin is warm and dry.  Neurological:  Mental Status: He is alert and oriented to person, place, and time.    ED Results / Procedures / Treatments   Labs (all labs ordered are listed, but only abnormal results are displayed) Labs Reviewed  COMPREHENSIVE METABOLIC PANEL - Abnormal; Notable for the following components:      Result Value   Sodium 134 (*)    All other components within normal limits  MAGNESIUM  CBC WITH DIFFERENTIAL/PLATELET  CBG MONITORING, ED  TROPONIN I (HIGH SENSITIVITY)  TROPONIN I (HIGH SENSITIVITY)    EKG EKG Interpretation  Date/Time:  Wednesday May 04 2021 09:31:34 EST Ventricular Rate:  70 PR Interval:  160 QRS Duration: 97 QT Interval:  372 QTC Calculation: 402 R Axis:   24 Text Interpretation: Sinus rhythm Artifact Otherwise within normal limits Confirmed by Carmin Muskrat [phone]) on 05/04/2021 1:48:38 PM  Radiology CT Angio Chest PE W and/or Wo  Contrast  Result Date: 05/04/2021 CLINICAL DATA:  Tachycardia and shortness of breath. EXAM: CT ANGIOGRAPHY CHEST WITH CONTRAST TECHNIQUE: Multidetector CT imaging of the chest was performed using the standard protocol during bolus administration of intravenous contrast. Multiplanar CT image reconstructions and MIPs were obtained to evaluate the vascular anatomy. RADIATION DOSE REDUCTION: This exam was performed according to the departmental dose-optimization program which includes automated exposure control, adjustment of the mA and/or kV according to patient size and/or use of iterative reconstruction technique. CONTRAST:  69mL OMNIPAQUE IOHEXOL 350 MG/ML SOLN COMPARISON:  None. FINDINGS: Cardiovascular: Satisfactory opacification of the pulmonary arteries to the segmental level. No evidence of pulmonary embolism. Normal heart size. No pericardial effusion. Mediastinum/Nodes: No enlarged mediastinal, hilar, or axillary lymph nodes. Thyroid gland, trachea, and esophagus demonstrate no significant findings. Lungs/Pleura: Lungs are clear. No pleural effusion or pneumothorax. Upper Abdomen: Large heterogeneous left renal mass measuring at least 7.6 x 7.7 cm, as seen on prior CT examination, likely renal cell carcinoma. Multiple small adjacent nodules concerning for metastatic disease. Musculoskeletal: Lytic lesion in the left seventh rib measuring approximately 3.0 x 2.3 cm Review of the MIP images confirms the above findings. IMPRESSION: 1. No evidence of pulmonary embolism or acute pulmonary process. No suspicious pulmonary nodule. 2. Large left renal mass and a lytic lesion in the left seventh rib concerning for renal cell neoplasm with metastatic disease. Electronically Signed   By: Keane Police D.O.   On: 05/04/2021 11:27   DG Chest Portable 1 View  Result Date: 05/04/2021 CLINICAL DATA:  Shortness of breath. EXAM: PORTABLE CHEST 1 VIEW COMPARISON:  April 18, 2021. FINDINGS: The heart size and mediastinal  contours are within normal limits. Both lungs are clear. Lytic destructive lesion is seen involving the lateral portion of the left seventh rib consistent with metastatic lesion as described on prior CT scan. IMPRESSION: Continued presence of lytic destructive lesion involving lateral portion of the left seventh rib consistent with metastatic disease as described on prior CT scan. No other abnormality seen in the chest. Electronically Signed   By: Marijo Conception M.D.   On: 05/04/2021 09:29    Procedures Procedures    Medications Ordered in ED Medications  0.9 %  sodium chloride infusion ( Intravenous New Bag/Given 05/04/21 0919)  sodium chloride (PF) 0.9 % injection (has no administration in time range)  aspirin chewable tablet 324 mg (324 mg Oral Given 05/04/21 0919)  iohexol (OMNIPAQUE) 350 MG/ML injection 80 mL (80 mLs Intravenous Contrast Given 05/04/21 1046)    ED Course/ Medical Decision Making/ A&P This patient presents to  the ED for concern of tachycardia, dyspnea, palpitations and fatigue, this involves an extensive number of treatment options, and is a complaint that carries with it a high risk of complications and morbidity.  The differential diagnosis includes PE, malignancy related metabolic strain, electrolyte abnormalities, metastatic spread, infection, pneumonia, atypical ACS   Co morbidities that complicate the patient evaluation  Noted prior tobacco dependence   Social Determinants of Health:  As above tobacco dependence previously   Additional history obtained:  Additional history and/or information obtained from wife, chart review including oncology External records from outside source obtained and reviewed including oncology notes notable for metastatic spread including likely pathologic rib lesion, left side.   After the initial evaluation, orders, including: CT angiography, labs, fluids were initiated.  Patient placed on Cardiac and Pulse-Oximetry Monitors. The  patient was maintained on a cardiac monitor.  The cardiac monitored showed an rhythm of variable 90/110, sinus, abnormal The patient was also maintained on pulse oximetry. The readings were typically in the 98% room air normal  On repeat evaluation of the patient stayed the same  Lab Tests:  I personally interpreted labs.  The pertinent results include: Troponin x2 both unremarkable  Imaging Studies ordered:  I independently visualized and interpreted imaging which showed likely metastatic destruction of the left seventh rib with associated impingement of the thoracic cavity.  I demonstrated the pictures to the patient and his wife at bedside, illustrating these findings as well as otherwise reassuring pleural architecture, absence of PE I agree with the radiologist interpretation   Dispostion / Final MDM:  After consideration of the diagnostic results and the patient's response to treatment, he is appropriate to follow-up closely with his oncologist for anticipated PET scan in the coming week, as well as ongoing monitoring, management for his metastatic renal cancer for which she has not yet started therapy.  I feel that the patent would benefit from discharge with close follow-up as above, which is most easily facilitated as an outpatient, the hospitalization was considered given the patient's tachycardia, dyspnea.  However, here, no evidence for PE, 2 normal troponin in the context of heart score of 1 is reassuring for low suspicion of atypical ACS, no history of bacteremia, sepsis, pneumonia, PE.  Additional items will require more work-up as an outpatient, patient, wife both amenable to this, understanding of need for this.  Patient discharged in stable condition..   Final Clinical Impression(s) / ED Diagnoses Final diagnoses:  Palpitations  SOB (shortness of breath)    Rx / DC Orders ED Discharge Orders     None         Carmin Muskrat, MD 05/04/21 1348

## 2021-05-04 NOTE — ED Triage Notes (Signed)
Patient reports feeling his heart race this morning and last night, reports this happened at rest, reports his HR was up to 114 while laying in the bed. Reports SOB and feeling a pinch from his chest to his L arm earlier, denies current pain. Has not started cancer treatment at this time.

## 2021-05-05 ENCOUNTER — Encounter: Payer: Self-pay | Admitting: Orthopaedic Surgery

## 2021-05-05 ENCOUNTER — Ambulatory Visit: Payer: 59 | Admitting: Orthopaedic Surgery

## 2021-05-05 DIAGNOSIS — M898X5 Other specified disorders of bone, thigh: Secondary | ICD-10-CM

## 2021-05-05 NOTE — Progress Notes (Signed)
Office Visit Note   Patient: Gordon Mcclain           Date of Birth: January 19, 1976           MRN: [id number] Visit Date: 05/05/2021              Requested by: Lincoln Brigham, PA-C [place],  [place] [address] PCP: Tammi Sou, MD   Assessment & Plan: Visit Diagnoses:  1. Pain of left femur     Plan: Impression is left femoral neck metastatic renal cell carcinoma.  The scan shows that the lesion has broken through the anterior femoral neck cortex and I feel that this is an impending pathologic fracture.  Therefore we will protect his weightbearing with either crutches or a walker.  He understands that he can place is much weight on his left leg as long as he does not have any pain.  He will need to be on desk duty from home indefinitely until he sees orthopedic oncologist to discuss further treatment plans and options.  I have made a referral to Dr. Magda Bernheim or [facility] for this.  Questions encouraged and answered.  Follow-Up Instructions: No follow-ups on file.   Orders:  Orders Placed This Encounter  Procedures   Ambulatory referral to Orthopedic Surgery   No orders of the defined types were placed in this encounter.     Procedures: No procedures performed   Clinical Data: No additional findings.   Subjective: Chief Complaint  Patient presents with   Left Hip - Pain    HPI  Gordon Mcclain is a very pleasant but unfortunate 46 year old gentleman who was recently diagnosed with metastatic renal cell carcinoma on 04/18/2021.  He recently had a CT chest abdomen pelvis which showed metastatic lesion to the left femoral neck.  This is a referral from primary care doctor.  He is experiencing left hip and groin pain with prolonged standing and activity.  He is currently still working as a Pharmacist, hospital.  Review of Systems  Constitutional: Negative.   All other systems reviewed and are negative.   Objective: Vital Signs: There  were no vitals taken for this visit.  Physical Exam Vitals and nursing note reviewed.  Constitutional:      Appearance: He is well-developed.  HENT:     Head: Normocephalic and atraumatic.  Eyes:     Pupils: Pupils are equal, round, and reactive to light.  Pulmonary:     Effort: Pulmonary effort is normal.  Abdominal:     Palpations: Abdomen is soft.  Musculoskeletal:        General: Normal range of motion.     Cervical back: Neck supple.  Skin:    General: Skin is warm.  Neurological:     Mental Status: He is alert and oriented to person, place, and time.  Psychiatric:        Behavior: Behavior normal.        Thought Content: Thought content normal.        Judgment: Judgment normal.    Ortho Exam  Examination of the left hip shows decent range of motion with moderate pain with internal rotation.  He has no pain with heel percussion.  He is able to ambulate without any assistive devices.  Specialty Comments:  No specialty comments available.  Imaging: No results found.   PMFS History: Patient Active Problem List   Diagnosis Date Noted   Renal mass, left 04/20/2021  Rib lesion 04/20/2021   Chest wall pain 04/18/2021   Past Medical History:  Diagnosis Date   Frequent headaches    occipital region mostly: takes ibuprofen 800 mg bid most days   Renal cancer ([place])    Tobacco dependence     Family History  Problem Relation Age of Onset   Arthritis Mother    Cancer Maternal Aunt    Heart attack Maternal Uncle    Heart attack Maternal Grandmother    Heart attack Maternal Grandfather    Heart attack Maternal Aunt     Past Surgical History:  Procedure Laterality Date   PILONIDAL CYST EXCISION  approx 2002   Social History   Occupational History   Not on file  Tobacco Use   Smoking status: Every Day    Packs/day: 1.00    Years: 20.00    Pack years: 20.00    Types: Cigarettes   Smokeless tobacco: Never   Tobacco comments:    Decreased to 1-2  cigarettes a day.   Vaping Use   Vaping Use: Never used  Substance and Sexual Activity   Alcohol use: No   Drug use: No   Sexual activity: Not on file

## 2021-05-09 ENCOUNTER — Inpatient Hospital Stay (HOSPITAL_BASED_OUTPATIENT_CLINIC_OR_DEPARTMENT_OTHER): Payer: 59 | Admitting: Oncology

## 2021-05-09 ENCOUNTER — Encounter: Payer: Self-pay | Admitting: Family Medicine

## 2021-05-09 ENCOUNTER — Other Ambulatory Visit: Payer: Self-pay

## 2021-05-09 ENCOUNTER — Telehealth: Payer: Self-pay

## 2021-05-09 VITALS — BP 112/71 | HR 69 | Temp 97.7°F | Resp 17 | Ht 65.0 in | Wt 187.4 lb

## 2021-05-09 DIAGNOSIS — R229 Localized swelling, mass and lump, unspecified: Secondary | ICD-10-CM | POA: Insufficient documentation

## 2021-05-09 DIAGNOSIS — F17210 Nicotine dependence, cigarettes, uncomplicated: Secondary | ICD-10-CM | POA: Insufficient documentation

## 2021-05-09 DIAGNOSIS — R599 Enlarged lymph nodes, unspecified: Secondary | ICD-10-CM | POA: Diagnosis not present

## 2021-05-09 DIAGNOSIS — C642 Malignant neoplasm of left kidney, except renal pelvis: Secondary | ICD-10-CM

## 2021-05-09 DIAGNOSIS — Z809 Family history of malignant neoplasm, unspecified: Secondary | ICD-10-CM

## 2021-05-09 DIAGNOSIS — Z5112 Encounter for antineoplastic immunotherapy: Secondary | ICD-10-CM | POA: Insufficient documentation

## 2021-05-09 DIAGNOSIS — N2889 Other specified disorders of kidney and ureter: Secondary | ICD-10-CM

## 2021-05-09 DIAGNOSIS — C7951 Secondary malignant neoplasm of bone: Secondary | ICD-10-CM

## 2021-05-09 DIAGNOSIS — R0789 Other chest pain: Secondary | ICD-10-CM | POA: Diagnosis not present

## 2021-05-09 DIAGNOSIS — R918 Other nonspecific abnormal finding of lung field: Secondary | ICD-10-CM | POA: Diagnosis not present

## 2021-05-09 DIAGNOSIS — C679 Malignant neoplasm of bladder, unspecified: Secondary | ICD-10-CM | POA: Diagnosis not present

## 2021-05-09 DIAGNOSIS — Z510 Encounter for antineoplastic radiation therapy: Secondary | ICD-10-CM | POA: Diagnosis present

## 2021-05-09 DIAGNOSIS — C649 Malignant neoplasm of unspecified kidney, except renal pelvis: Secondary | ICD-10-CM

## 2021-05-09 MED ORDER — PROCHLORPERAZINE MALEATE 10 MG PO TABS: 10.0000 mg | ORAL_TABLET | Freq: Four times a day (QID) | ORAL | 0 refills | Status: DC | PRN

## 2021-05-09 NOTE — Progress Notes (Signed)
START ON PATHWAY REGIMEN - Renal Cell     A cycle is every 21 days:     Nivolumab      Ipilimumab    A cycle is every 28 days:     Nivolumab   **Always confirm dose/schedule in your pharmacy ordering system**  Patient Characteristics: Stage IV (Unresected T4M0 or Any T, M1)/Metastatic Disease, Clear Cell, First Line, Intermediate or Poor Risk Therapeutic Status: Stage IV (Unresected T4M0 or Any T, M1)/Metastatic Disease Histology: Clear Cell Line of Therapy: First Line Risk Status: Intermediate Risk Intent of Therapy: Non-Curative / Palliative Intent, Discussed with Patient

## 2021-05-09 NOTE — Telephone Encounter (Signed)
Pts wife called into the office stating that [facility] has yet to receive a referral for patient. They have an apt 20th and need that referral to be sent over or she needs to bring with patient pathology reports along with the Disk of the Ct scan. The fax # that this referral needs to be sent to is [phone] for his upcoming apt on the 20th

## 2021-05-09 NOTE — Progress Notes (Signed)
Reason for the request:    Kidney cancer  HPI: I was asked by Dr. Anitra Lauth to evaluate Gordon Mcclain for evaluation of advanced kidney cancer.  He is a 46 year old man without any significant comorbid conditions who started developing symptoms of chest pain dating back to August 2022.  He was treated conservatively but subsequently continued to have chest pain which has persisted and repeat imaging studies of the chest on April 18, 2021 showed a 7.7 x 7.1 cm left renal mass with perirenal lymph node involvement.  He was also noted to have a lytic mass in the lateral aspect of the seventh rib measuring 3.5 x 2.2 cm.  CT scan of the abdomen and pelvis obtained on April 29, 2021 showed a 7.9 cm mass in the upper pole of the left kidney with the multiple small soft tissue nodules extend into the left posterior subdiaphragmatic region.  Lytic bone metastasis in the left femoral neck as well as the left lateral seventh rib identified.  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  He was evaluated by orthopedic surgery for his left hip lesion and referred to [name] for orthopedic oncology evaluation.  Clinically, he reports few complaints of predominantly left chest wall pain although pain is manageable at this time.  He is ambulating without any major difficulties but was asked to use a cane and limited weightbearing to protect his left hip.  His appetite remains excellent without any decline in his performance status.  He does not report any headaches, blurry vision, syncope or seizures. Does not report any fevers, chills or sweats.  Does not report any cough, wheezing or hemoptysis.  Does not report any chest pain, palpitation, orthopnea or leg edema.  Does not report any nausea, vomiting or abdominal pain.  Does not report any constipation or diarrhea.  Does not report any skeletal complaints.    Does not report  frequency, urgency or hematuria.  Does not report any skin rashes or lesions. Does not report any heat or cold intolerance.  Does not report any lymphadenopathy or petechiae.  Does not report any anxiety or depression.  Remaining review of systems is negative.     Past Medical History:  Diagnosis Date   Frequent headaches    occipital region mostly: takes ibuprofen 800 mg bid most days   Renal cancer ([place])    Tobacco dependence   :   Past Surgical History:  Procedure Laterality Date   PILONIDAL CYST EXCISION  approx 2002  :   Current Outpatient Medications:    acetaminophen (TYLENOL) 325 MG tablet, Take 650 mg by mouth every 6 (six) hours as needed., Disp: , Rfl:    cyclobenzaprine (FLEXERIL) 10 MG tablet, Take by mouth. (Patient not taking: Reported on 12/01/2020), Disp: , Rfl:    ibuprofen (ADVIL) 800 MG tablet, Take by mouth., Disp: , Rfl:    lidocaine (LIDODERM) 5 %, Place 1 patch onto the skin daily. Remove & Discard patch within 12 hours or as directed by MD, Disp: 30 patch, Rfl: 0   predniSONE (DELTASONE) 50 MG tablet, One tab PO daily for 5 days. (Patient not taking: Reported on 04/20/2021), Disp: 5 tablet, Rfl: 0   traMADol (ULTRAM) 50 MG tablet, Take 1 tablet (50 mg total) by mouth every 6 (six) hours as needed for moderate pain., Disp: 30 tablet, Rfl: 0:  No Known Allergies:   Family History  Problem Relation Age of Onset   Arthritis Mother    Cancer Maternal Aunt    Heart attack Maternal Uncle    Heart attack Maternal Grandmother    Heart attack Maternal Grandfather    Heart attack Maternal Aunt   :   Social History   Socioeconomic History   Marital status: Married    Spouse name: Not on file   Number of children: Not on file   Years of education: Not on file   Highest education level: Not on file  Occupational History   Not on file  Tobacco Use   Smoking status: Every Day    Packs/day: 1.00    Years: 20.00    Pack years: 20.00    Types: Cigarettes    Smokeless tobacco: Never   Tobacco comments:    Decreased to 1-2 cigarettes a day.   Vaping Use   Vaping Use: Never used  Substance and Sexual Activity   Alcohol use: No   Drug use: No   Sexual activity: Not on file  Other Topics Concern   Not on file  Social History Narrative   Married, 1 daughter and one son.   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   Orig from [place], [place] area.   Education: HS.   Tob 20  pck-yr hx (current as of 03/2014).   Alc: none.  No hx of drug use.   Caffeine: 3-4 cups coffee/day, 1 cup tea per day.   No exercise.   Social Determinants of Health   Financial Resource Strain: Not on file  Food Insecurity: Not on file  Transportation Needs: Not on file  Physical Activity: Not on file  Stress: Not on file  Social Connections: Not on file  Intimate Partner Violence: Not on file  :  Pertinent items are noted in HPI.  Exam: Blood pressure 112/71, pulse 69, temperature 97.7 F (36.5 C), temperature source Temporal, resp. rate 17, height 5\' 5"  (1.651 m), weight 187 lb 6.4 oz (85 kg), SpO2 100 %. ECOG 0 General appearance: alert and cooperative appeared without distress. Head: atraumatic without any abnormalities. Eyes: conjunctivae/corneas clear. PERRL.  Sclera anicteric. Throat: lips, mucosa, and tongue normal; without oral thrush or ulcers. Resp: clear to auscultation bilaterally without rhonchi, wheezes or dullness to percussion. Cardio: regular rate and rhythm, S1, S2 normal, no murmur, click, rub or gallop GI: soft, non-tender; bowel sounds normal; no masses,  no organomegaly Skin: Skin color, texture, turgor normal. No rashes or lesions Lymph nodes: Cervical, supraclavicular, and axillary nodes normal. Neurologic: Grossly normal without any motor, sensory or deep tendon reflexes. Musculoskeletal: No joint deformity or effusion.    CT Chest Wo Contrast  Result Date: 04/18/2021 CLINICAL DATA:  Persistent  left lower posterior chest wall pain radiating around to the anterior left lower chest since August 2022 when the patient had blunt chest wall trauma. Popping sensation when taking deep breaths. EXAM: CT CHEST WITHOUT CONTRAST TECHNIQUE: Multidetector CT imaging of the chest was performed following the standard protocol without IV contrast. RADIATION DOSE REDUCTION: This exam was performed according to the departmental dose-optimization program which includes automated exposure control, adjustment of the mA and/or kV according to patient size and/or use of iterative reconstruction technique. COMPARISON:  Chest and left rib radiographs dated 11/22/2020 and chest radiographs dated 03/25/2021. FINDINGS: Cardiovascular: No significant vascular findings. Normal heart size. No pericardial effusion. Mediastinum/Nodes: No enlarged mediastinal or axillary lymph nodes. Thyroid gland, trachea, and esophagus demonstrate no significant findings. Lungs/Pleura: Lungs  are clear. No pleural effusion or pneumothorax. Upper Abdomen: Partially included large upper pole left renal mass measuring 7.7 x 7.1 cm on image number 147/2. This contains a small peripheral calcification medially. There are several mildly enlarged perirenal and pararenal lymph nodes medially on the left, the largest measuring 8 mm in short axis diameter on image number 139/2. There is also a probable accessory splenule measuring 11 mm in short axis diameter on image number 137/2. An additional mildly enlarged lymph node is less likely. The adrenal glands have normal appearances. Musculoskeletal: There is a lytic mass in the lateral aspect of the left 7th rib, measuring 3.5 x 2.2 cm on image number 86/2, protruding into the adjacent external pleural region. IMPRESSION: Partially included large upper pole left renal mass with an associated left 7th rib metastasis and possible left perirenal/pararenal metastatic adenopathy. Further evaluation with a chest and abdomen  CT or MRI without and with contrast is recommended. These results will be called to the ordering clinician or representative by the Radiologist Assistant, and communication documented in the PACS or [employer]. Electronically Signed   By: Claudie Revering M.D.   On: 04/18/2021 15:07   CT Angio Chest PE W and/or Wo Contrast  Result Date: 05/04/2021 CLINICAL DATA:  Tachycardia and shortness of breath. EXAM: CT ANGIOGRAPHY CHEST WITH CONTRAST TECHNIQUE: Multidetector CT imaging of the chest was performed using the standard protocol during bolus administration of intravenous contrast. Multiplanar CT image reconstructions and MIPs were obtained to evaluate the vascular anatomy. RADIATION DOSE REDUCTION: This exam was performed according to the departmental dose-optimization program which includes automated exposure control, adjustment of the mA and/or kV according to patient size and/or use of iterative reconstruction technique. CONTRAST:  73mL OMNIPAQUE IOHEXOL 350 MG/ML SOLN COMPARISON:  None. FINDINGS: Cardiovascular: Satisfactory opacification of the pulmonary arteries to the segmental level. No evidence of pulmonary embolism. Normal heart size. No pericardial effusion. Mediastinum/Nodes: No enlarged mediastinal, hilar, or axillary lymph nodes. Thyroid gland, trachea, and esophagus demonstrate no significant findings. Lungs/Pleura: Lungs are clear. No pleural effusion or pneumothorax. Upper Abdomen: Large heterogeneous left renal mass measuring at least 7.6 x 7.7 cm, as seen on prior CT examination, likely renal cell carcinoma. Multiple small adjacent nodules concerning for metastatic disease. Musculoskeletal: Lytic lesion in the left seventh rib measuring approximately 3.0 x 2.3 cm Review of the MIP images confirms the above findings. IMPRESSION: 1. No evidence of pulmonary embolism or acute pulmonary process. No suspicious pulmonary nodule. 2. Large left renal mass and a lytic lesion in the left seventh rib  concerning for renal cell neoplasm with metastatic disease. Electronically Signed   By: Keane Police D.O.   On: 05/04/2021 11:27    Assessment and Plan:   46 year old with:  1.  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  He was found to have left renal mass measuring 7.9 cm with metastatic disease documented with a biopsy to left ribs.  He has also metastatic lesion to the left hip and subcutaneous nodules.   The natural course of this disease was discussed today and treatment options were reviewed.  I believe systemic therapy is warranted at this time given his advanced disease beyond the kidney and beyond oligometastatic lesions.  Systemic therapy options including oral targeted therapy, immunotherapy, combination immunotherapy or combination immunotherapy and oral targeted therapy.  The rationale for all of those options were discussed at this time.  After discussion, I have recommended proceeding with  combination immunotherapy utilizing nivolumab and ipilimumab.  Complication associated with immunotherapy were discussed today in detail.  These would include GI toxicity, dermatological toxicity and autoimmune complications.  These include pneumonitis, colitis, hypophysitis, thyroid disease among others.  After discussion today he is agreeable to proceed after education class.  After completing 4 cycles of therapy will repeat imaging studies and consider different salvage therapy options if needed to.   If he accomplishes an excellent response to therapy, salvage nephrectomy could be considered at that time.  2.  IV access: We will proceed with peripheral veins at this time will defer the option of a Port-A-Cath.  3.  Antiemetics: Prescription for Compazine will be made available to him.  4.  Left hip metastasis: The risk for fracture is high after orthopedic surgery evaluation.  He will be evaluated at [facility] for possibility  of surgical fixation.  He understands to limit weightbearing at this time.  5.  Seventh rib metastasis: He will be evaluated by Dr. Tammi Klippel in the near future for palliative radiation therapy.  6.  Immunotherapy surveillance: We will continue to monitor thyroid disease and educate him about complications including pneumonitis, colitis and arthritis.  7.  CNS screening: We will obtain MRI in the near future to rule out brain metastasis.  8.  Follow-up: Will be in the near future to start therapy.   80  minutes were dedicated to this visit. The time was spent on reviewing laboratory data, imaging studies, discussing treatment options, discussing pathology results, discussing the order and the modalities available to treat his cancer as well as complication related to cancer and cancer therapy.     A copy of this consult has been forwarded to the requesting physician.

## 2021-05-10 ENCOUNTER — Encounter: Payer: Self-pay | Admitting: Family Medicine

## 2021-05-10 NOTE — Progress Notes (Addendum)
Histology and Location of Primary Cancer: Metastatic renal cell left mass carcinoma to bone  Sites of Visceral and Bony Metastatic Disease: left femoral neck and left lateral seventh rib   Location(s) of Symptomatic Metastases: Lytic bone metastasis in the left femoral neck as well as the left lateral seventh rib identified  Past/Anticipated chemotherapy by medical oncology, if any:   Dr. Alen Blew  Plan to start:  05/17/2021 ipilimumab (YERVOY) 85 mg in sodium chloride 0.9 % 50 mL chemo infusion nivolumab (OPDIVO) 255 mg in sodium chloride 0.9 % 100 mL chemo infusion  Pain on a scale of 0-10 is: 2/10   IPSS: 17   If Spine Met(s), symptoms, if any, include: Bowel/Bladder retention or incontinence (please describe):  No bowel or bladder issues. Numbness or weakness in extremities (please describe): Yes, all over weakness and numbness to lower left leg when in dependent position. Current Decadron regimen, if applicable: no  Ambulatory status? Walker/cane as needed for long mobility use.  SAFETY ISSUES: Prior radiation? No Pacemaker/ICD? No Possible current pregnancy?  Male Is the patient on methotrexate? No  Current Complaints / other details:  Understanding treatment options.

## 2021-05-11 NOTE — Progress Notes (Signed)
Pharmacist Chemotherapy Monitoring - Initial Assessment    Anticipated start date: 05/18/21   The following has been reviewed per standard work regarding the patient's treatment regimen: The patient's diagnosis, treatment plan and drug doses, and organ/hematologic function Lab orders and baseline tests specific to treatment regimen  The treatment plan start date, drug sequencing, and pre-medications Prior authorization status  Patient's documented medication list, including drug-drug interaction screen and prescriptions for anti-emetics and supportive care specific to the treatment regimen The drug concentrations, fluid compatibility, administration routes, and timing of the medications to be used The patient's access for treatment and lifetime cumulative dose history, if applicable  The patient's medication allergies and previous infusion related reactions, if applicable   Changes made to treatment plan:  N/A  Follow up needed:  N/A   Larene Beach, RPH, 05/11/2021  2:51 PM

## 2021-05-12 ENCOUNTER — Other Ambulatory Visit: Payer: Self-pay

## 2021-05-12 ENCOUNTER — Ambulatory Visit (INDEPENDENT_AMBULATORY_CARE_PROVIDER_SITE_OTHER): Payer: 59 | Admitting: Family Medicine

## 2021-05-12 ENCOUNTER — Encounter: Payer: Self-pay | Admitting: Family Medicine

## 2021-05-12 ENCOUNTER — Ambulatory Visit
Admission: RE | Admit: 2021-05-12 | Discharge: 2021-05-12 | Disposition: A | Discharge status: Home / Self Care | Payer: 59 | Source: Ambulatory Visit | Attending: Radiation Oncology | Admitting: Radiation Oncology

## 2021-05-12 VITALS — BP 116/81 | HR 73 | Temp 98.3°F | Resp 20 | Ht 65.0 in | Wt 187.2 lb

## 2021-05-12 VITALS — BP 116/81 | HR 73 | Temp 98.3°F | Resp 20 | Wt 187.2 lb

## 2021-05-12 DIAGNOSIS — C679 Malignant neoplasm of bladder, unspecified: Secondary | ICD-10-CM | POA: Insufficient documentation

## 2021-05-12 DIAGNOSIS — C7951 Secondary malignant neoplasm of bone: Secondary | ICD-10-CM | POA: Insufficient documentation

## 2021-05-12 DIAGNOSIS — C649 Malignant neoplasm of unspecified kidney, except renal pelvis: Secondary | ICD-10-CM

## 2021-05-12 DIAGNOSIS — Z5112 Encounter for antineoplastic immunotherapy: Secondary | ICD-10-CM | POA: Diagnosis not present

## 2021-05-12 DIAGNOSIS — R599 Enlarged lymph nodes, unspecified: Secondary | ICD-10-CM | POA: Insufficient documentation

## 2021-05-12 DIAGNOSIS — F17210 Nicotine dependence, cigarettes, uncomplicated: Secondary | ICD-10-CM | POA: Insufficient documentation

## 2021-05-12 DIAGNOSIS — Z809 Family history of malignant neoplasm, unspecified: Secondary | ICD-10-CM | POA: Insufficient documentation

## 2021-05-12 DIAGNOSIS — Z510 Encounter for antineoplastic radiation therapy: Secondary | ICD-10-CM | POA: Insufficient documentation

## 2021-05-12 DIAGNOSIS — R918 Other nonspecific abnormal finding of lung field: Secondary | ICD-10-CM | POA: Insufficient documentation

## 2021-05-12 DIAGNOSIS — M899 Disorder of bone, unspecified: Secondary | ICD-10-CM

## 2021-05-12 DIAGNOSIS — C642 Malignant neoplasm of left kidney, except renal pelvis: Secondary | ICD-10-CM | POA: Insufficient documentation

## 2021-05-12 DIAGNOSIS — R0789 Other chest pain: Secondary | ICD-10-CM | POA: Insufficient documentation

## 2021-05-12 NOTE — Progress Notes (Signed)
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    ICD-10-CM   1. 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 male seen at the request of Dr. Alen Blew.  He initially presented to the emergency department in August 2022 with complaints of chest pain.  This was initially treated conservatively but his pain persisted and became severe in January 2023 so a CT chest was performed on 04/18/2021 and showed a partially imaged left renal mass measuring approximately 7.7 cm with several mildly enlarged lymph nodes and a lytic mass in the lateral aspect of the left seventh rib measuring approximately 3.5 cm.  He met with Dr. Lovena Neighbours at [facility] on 04/21/2021 and the recommendation was to proceed with biopsy for tissue confirmation as well as further imaging to complete disease staging.  A CT-guided biopsy of the left rib lesion was performed on 04/28/2021 and confirmed metastatic clear-cell renal cell carcinoma.  A CT C/A/P was performed for disease staging on 04/29/2021 and confirmed a 7.9 cm heterogeneously enhancing mass in the upper pole of the left kidney, consistent with renal cell carcinoma.  Additionally, there were multiple small adjacent soft tissue nodules extending into the left posterior subdiaphragmatic region, lytic bone metastases in the left femoral neck and left lateral seventh rib, consistent with metastatic disease.  He was referred to medical oncology and met with Dede Query, PA-C on 05/02/2021 and more recently with Dr. Alen Blew on  05/09/2021.  The plan is to proceed with combo immunotherapy with nivolumab and ipilimumab with his first infusion scheduled for 05/18/2021.  He has been kindly referred for consultation today to discuss the possible role for palliative radiotherapy in the management of painful bony metastases.  He has also been referred to Dr. Janice Coffin, an oncologic orthopedic surgeon at [facility] for consideration of surgical fixation of the left femoral neck lesion since he is at high risk for fracture. His appointment with Dr. Mylo Red is Monday, Feb. 20, 2023.  PREVIOUS RADIATION THERAPY: No  PAST MEDICAL HISTORY:  Past Medical History:  Diagnosis Date   Frequent headaches    occipital region mostly: takes ibuprofen 800 mg bid most days   Palpitations    Zio patch reassuring 04/2021   Renal cancer ([place]) 03/2021   Left->presented with bone met to L rib.   Tobacco dependence       PAST SURGICAL HISTORY: Past Surgical History:  Procedure Laterality Date   PILONIDAL CYST EXCISION  approx 2002   zio patch     04/2021 zio reassuring.    FAMILY HISTORY:  Family History  Problem Relation Age of Onset   Arthritis Mother    Cancer Maternal Aunt    Heart attack Maternal Uncle    Heart attack Maternal Grandmother    Heart attack Maternal Grandfather    Heart attack Maternal Aunt     SOCIAL HISTORY:  Social History   Socioeconomic History   Marital status: Married    Spouse name: Not on file  Number of children: Not on file   Years of education: Not on file   Highest education level: Not on file  Occupational History   Not on file  Tobacco Use   Smoking status: Former    Packs/day: 1.00    Years: 20.00    Pack years: 20.00    Types: Cigarettes    Quit date: 04/27/2021    Years since quitting: 0.0    Passive exposure: Never   Smokeless tobacco: Never  Vaping Use   Vaping Use: Never used  Substance and Sexual Activity   Alcohol use: No   Drug use: No   Sexual activity: Yes    Partners:  Female  Other Topics Concern   Not on file  Social History Narrative   Married, 1 daughter and one son.   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   Orig from [place], [place] area.   Education: HS.   Tob 20  pck-yr hx (current as of 03/2014).   Alc: none.  No hx of drug use.   Caffeine: 3-4 cups coffee/day, 1 cup tea per day.   No exercise.   Social Determinants of Health   Financial Resource Strain: Not on file  Food Insecurity: Not on file  Transportation Needs: Not on file  Physical Activity: Not on file  Stress: Not on file  Social Connections: Not on file  Intimate Partner Violence: Not on file    ALLERGIES: Patient has no known allergies.  MEDICATIONS:  Current Outpatient Medications  Medication Sig Dispense Refill   acetaminophen (TYLENOL) 325 MG tablet Take 650 mg by mouth every 6 (six) hours as needed.     cyclobenzaprine (FLEXERIL) 10 MG tablet Take by mouth. (Patient not taking: Reported on 12/01/2020)     ibuprofen (ADVIL) 800 MG tablet Take by mouth. (Patient not taking: Reported on 05/12/2021)     lidocaine (LIDODERM) 5 % Place 1 patch onto the skin daily. Remove & Discard patch within 12 hours or as directed by MD 30 patch 0   prochlorperazine (COMPAZINE) 10 MG tablet Take 1 tablet (10 mg total) by mouth every 6 (six) hours as needed for nausea or vomiting. 30 tablet 0   traMADol (ULTRAM) 50 MG tablet Take 1 tablet (50 mg total) by mouth every 6 (six) hours as needed for moderate pain. 30 tablet 0   No current facility-administered medications for this encounter.    REVIEW OF SYSTEMS:  On review of systems, the patient reports that he is doing well overall.  He denies any chest pain, shortness of breath, cough, fevers, chills, night sweats, or unintended weight changes.  The left rib pain is fairly well managed with Tramadol and occasionally uses a Lidocaine patch at night to help him rest. The left hip is not painful but he does  feel like the left leg is weaker than the right. He is using a cane and rolling walker to assist with safe ambulation and is putting minimal weight on the left leg as instructed. He denies any bowel or bladder disturbances, and denies abdominal pain, nausea or vomiting.  He denies any new musculoskeletal or joint aches or pains. A complete review of systems is obtained and is otherwise negative.    PHYSICAL EXAM:  Wt Readings from Last 3 Encounters:  05/12/21 187 lb 3.2 oz (84.9 kg)  05/12/21 187 lb 3.2 oz (84.9 kg)  05/12/21 187 lb (84.8 kg)   Temp Readings from Last 3 Encounters:  05/12/21 98.3 F (36.8  C)  05/12/21 98.3 F (36.8 C) (Oral)  05/09/21 97.7 F (36.5 C) (Temporal)   BP Readings from Last 3 Encounters:  05/12/21 116/81  05/12/21 116/81  05/12/21 124/85   Pulse Readings from Last 3 Encounters:  05/12/21 73  05/12/21 73  05/12/21 61   Pain Assessment Pain Score: 2  Pain Loc: Rib Cage (left side and left hip/leg)/10  In general this is a well appearing Caucasian male in no acute distress. He's alert and oriented x4 and appropriate throughout the examination. Cardiopulmonary assessment is negative for acute distress and he exhibits normal effort.   KPS = 90  100 - Normal; no complaints; no evidence of disease. 90   - Able to carry on normal activity; minor signs or symptoms of disease. 80   - Normal activity with effort; some signs or symptoms of disease. 57   - Cares for self; unable to carry on normal activity or to do active work. 60   - Requires occasional assistance, but is able to care for most of his personal needs. 50   - Requires considerable assistance and frequent medical care. 38   - Disabled; requires special care and assistance. 64   - Severely disabled; hospital admission is indicated although death not imminent. 61   - Very sick; hospital admission necessary; active supportive treatment necessary. 10   - Moribund; fatal processes progressing  rapidly. 0     - Dead  Karnofsky DA, Abelmann [place], Craver LS and Burchenal JH [phone]) The use of the nitrogen mustards in the palliative treatment of carcinoma: with particular reference to bronchogenic carcinoma Cancer 1 [id number]  LABORATORY DATA:  Lab Results  Component Value Date   WBC 8.6 05/04/2021   HGB 14.5 05/04/2021   HCT 42.6 05/04/2021   MCV 92.2 05/04/2021   PLT 263 05/04/2021   Lab Results  Component Value Date   NA 134 (L) 05/04/2021   K 3.8 05/04/2021   CL 102 05/04/2021   CO2 27 05/04/2021   Lab Results  Component Value Date   ALT 33 05/04/2021   AST 29 05/04/2021   ALKPHOS 51 05/04/2021   BILITOT 0.6 05/04/2021     RADIOGRAPHY: CT Chest Wo Contrast  Result Date: 04/18/2021 CLINICAL DATA:  Persistent left lower posterior chest wall pain radiating around to the anterior left lower chest since August 2022 when the patient had blunt chest wall trauma. Popping sensation when taking deep breaths. EXAM: CT CHEST WITHOUT CONTRAST TECHNIQUE: Multidetector CT imaging of the chest was performed following the standard protocol without IV contrast. RADIATION DOSE REDUCTION: This exam was performed according to the departmental dose-optimization program which includes automated exposure control, adjustment of the mA and/or kV according to patient size and/or use of iterative reconstruction technique. COMPARISON:  Chest and left rib radiographs dated 11/22/2020 and chest radiographs dated 03/25/2021. FINDINGS: Cardiovascular: No significant vascular findings. Normal heart size. No pericardial effusion. Mediastinum/Nodes: No enlarged mediastinal or axillary lymph nodes. Thyroid gland, trachea, and esophagus demonstrate no significant findings. Lungs/Pleura: Lungs are clear. No pleural effusion or pneumothorax. Upper Abdomen: Partially included large upper pole left renal mass measuring 7.7 x 7.1 cm on image number 147/2. This contains a small peripheral calcification medially. There are  several mildly enlarged perirenal and pararenal lymph nodes medially on the left, the largest measuring 8 mm in short axis diameter on image number 139/2. There is also a probable accessory splenule measuring 11 mm in short axis diameter on image number 137/2. An  additional mildly enlarged lymph node is less likely. The adrenal glands have normal appearances. Musculoskeletal: There is a lytic mass in the lateral aspect of the left 7th rib, measuring 3.5 x 2.2 cm on image number 86/2, protruding into the adjacent external pleural region. IMPRESSION: Partially included large upper pole left renal mass with an associated left 7th rib metastasis and possible left perirenal/pararenal metastatic adenopathy. Further evaluation with a chest and abdomen CT or MRI without and with contrast is recommended. These results will be called to the ordering clinician or representative by the Radiologist Assistant, and communication documented in the PACS or [employer]. Electronically Signed   By: Claudie Revering M.D.   On: 04/18/2021 15:07   CT Angio Chest PE W and/or Wo Contrast  Result Date: 05/04/2021 CLINICAL DATA:  Tachycardia and shortness of breath. EXAM: CT ANGIOGRAPHY CHEST WITH CONTRAST TECHNIQUE: Multidetector CT imaging of the chest was performed using the standard protocol during bolus administration of intravenous contrast. Multiplanar CT image reconstructions and MIPs were obtained to evaluate the vascular anatomy. RADIATION DOSE REDUCTION: This exam was performed according to the departmental dose-optimization program which includes automated exposure control, adjustment of the mA and/or kV according to patient size and/or use of iterative reconstruction technique. CONTRAST:  16mL OMNIPAQUE IOHEXOL 350 MG/ML SOLN COMPARISON:  None. FINDINGS: Cardiovascular: Satisfactory opacification of the pulmonary arteries to the segmental level. No evidence of pulmonary embolism. Normal heart size. No pericardial effusion.  Mediastinum/Nodes: No enlarged mediastinal, hilar, or axillary lymph nodes. Thyroid gland, trachea, and esophagus demonstrate no significant findings. Lungs/Pleura: Lungs are clear. No pleural effusion or pneumothorax. Upper Abdomen: Large heterogeneous left renal mass measuring at least 7.6 x 7.7 cm, as seen on prior CT examination, likely renal cell carcinoma. Multiple small adjacent nodules concerning for metastatic disease. Musculoskeletal: Lytic lesion in the left seventh rib measuring approximately 3.0 x 2.3 cm Review of the MIP images confirms the above findings. IMPRESSION: 1. No evidence of pulmonary embolism or acute pulmonary process. No suspicious pulmonary nodule. 2. Large left renal mass and a lytic lesion in the left seventh rib concerning for renal cell neoplasm with metastatic disease. Electronically Signed   By: Keane Police D.O.   On: 05/04/2021 11:27   CT GUIDED NEEDLE PLACEMENT  Result Date: 04/28/2021 CLINICAL DATA:  Left rib pain with expansile lytic lesion. Left renal mass suspected. EXAM: CT GUIDED CORE BONE BIOPSY OF LEFT SEVENTH RIB LESION ANESTHESIA/SEDATION: Intravenous Fentanyl 48mcg and Versed $RemoveBe'2mg'vESPIIvoR$  were administered as conscious sedation during continuous monitoring of the patient's level of consciousness and physiological / cardiorespiratory status by the radiology RN, with a total moderate sedation time of 18 minutes. PROCEDURE: The procedure risks, benefits, and alternatives were explained to the patient. Questions regarding the procedure were encouraged and answered. The patient understands and consents to the procedure. Select axial scans through the lower thorax were obtained. The lateral left seventh rib lesion was localized and an appropriate skin entry site was determined and marked. The operative field was prepped with chlorhexidinein a sterile fashion, and a sterile drape was applied covering the operative field. A sterile gown and sterile gloves were used for the  procedure. Local anesthesia was provided with 1% Lidocaine. Under real-time ultrasound guidance, 10 G gauge cook bone biopsy needle was advanced to the margin of the lesion. Coaxial core samples were obtained. A final core samples obtained through the guide needle itself which was then removed. Samples submitted in formalin to surgical pathology. Postprocedure scan shows no  hemorrhage or other apparent complication. The patient tolerated the procedure well. COMPLICATIONS: None immediate FINDINGS: Expansile lytic lesion involving the lateral aspect left seventh rib was localized corresponding to previous imaging findings. Representative core biopsy samples obtained as above. IMPRESSION: 1. Technically successful CT-guided core biopsy, left seventh rib expansile lytic lesion. RADIATION DOSE REDUCTION: This exam was performed according to the departmental dose-optimization program which includes automated exposure control, adjustment of the mA and/or kV according to patient size and/or use of iterative reconstruction technique. Electronically Signed   By: Corlis Leak M.D.   On: 04/28/2021 14:11   CT ABDOMEN PELVIS W CONTRAST  Result Date: 05/02/2021 CLINICAL DATA:  Left renal mass.  Staging. EXAM: CT ABDOMEN AND PELVIS WITH CONTRAST TECHNIQUE: Multidetector CT imaging of the abdomen and pelvis was performed using the standard protocol following bolus administration of intravenous contrast. RADIATION DOSE REDUCTION: This exam was performed according to the departmental dose-optimization program which includes automated exposure control, adjustment of the mA and/or kV according to patient size and/or use of iterative reconstruction technique. CONTRAST:  OMNIPAQUE IOHEXOL 300 MG/ML  SOLN COMPARISON:  None. FINDINGS: Lower Chest: Pathologic fracture of left lateral 7th rib with associated chest wall soft tissue mass is incompletely visualized on this exam, and better demonstrated on recent chest CT. No suspicious  pulmonary nodules visualized in the lung bases. Hepatobiliary: No hepatic masses identified. Gallbladder is unremarkable. No evidence of biliary ductal dilatation. Pancreas:  No mass or inflammatory changes. Spleen: Within normal limits in size and appearance. Adrenals/Urinary Tract: Normal adrenal glands and right kidney. A heterogeneously enhancing mass is seen in the upper pole of the left kidney which measures 7.9 x 7.2 cm. Multiple adjacent soft tissue nodules are seen in the superior left perinephric space, as well as in the left posterior subdiaphragmatic region (e.g. Image 28/2), which appear outside of Gerota's fascia. No evidence renal vein or IVC thrombus. No evidence of hydronephrosis. Unremarkable unopacified urinary bladder. Stomach/Bowel: No evidence of obstruction, inflammatory process or abnormal fluid collections. Normal appendix visualized. Vascular/Lymphatic: No pathologically enlarged lymph nodes. No acute vascular findings. Reproductive:  No mass or other significant abnormality. Other:  None. Musculoskeletal: Lytic bone metastasis seen in the left femoral neck, without pathologic fracture. IMPRESSION: 7.9 cm heterogeneously enhancing mass in upper pole of left kidney, consistent with renal cell carcinoma. Multiple small adjacent soft tissue nodules which extend into the left posterior subdiaphragmatic region,, likely outside of Gerota fascia, and consistent with metastatic disease. Lytic bone metastasis in left femoral neck and left lateral 7th rib. Electronically Signed   By: Danae Orleans M.D.   On: 05/02/2021 14:04   DG Chest Portable 1 View  Result Date: 05/04/2021 CLINICAL DATA:  Shortness of breath. EXAM: PORTABLE CHEST 1 VIEW COMPARISON:  April 18, 2021. FINDINGS: The heart size and mediastinal contours are within normal limits. Both lungs are clear. Lytic destructive lesion is seen involving the lateral portion of the left seventh rib consistent with metastatic lesion as described  on prior CT scan. IMPRESSION: Continued presence of lytic destructive lesion involving lateral portion of the left seventh rib consistent with metastatic disease as described on prior CT scan. No other abnormality seen in the chest. Electronically Signed   By: Lupita Raider M.D.   On: 05/04/2021 09:29   LONG TERM MONITOR (3-14 DAYS)  Result Date: 05/07/2021  Patient had a minimum heart rate of 50 bpm, maximum heart rate of 133 bpm, and average heart rate of 73 bpm.  Predominant underlying rhythm was sinus rhythm.  Isolated PACs were rare (<1.0%).  Isolated PVCs were rare (<1.0%).  Triggered and diary events associated with sinus rhythm and PVCs.  No malignant arrhythmias.     IMPRESSION/PLAN: 1. 46 y.o.  male with newly diagnosed metastatic clear cell RCC with painful bony metastases in the left hip and left 7th rib. Today, we talked to the patient and his wife about the findings and workup thus far. We discussed the natural history of metastatic clear cell renal cell carcinoma and general treatment, highlighting the role of palliative radiotherapy in the management of painful bony lesions and/or lesions at risk for fracture. We discussed the available radiation techniques, and focused on the details and logistics of delivery. The recommendation is for a 2 week course of daily palliative radiotherapy to the left 7th rib lesion and possibly to the left hip lesion. We will await the expert opinion of Dr. Mylo Red regarding the need for surgical fixation/stabilization or not and pending her recommendation, we can further discuss when to proceed with radiotherapy to the left hip. We discussed that the hip radiation may be the recommended treatment without surgery but even with surgery, he would still need post-op radiation to the left hip to eliminate residual cancer cells and reduce the risk of tumor recurrence. We reviewed the anticipated acute and late sequelae associated with radiation in this setting.  The patient and his wife were encouraged to ask questions that were answered to their stated satisfaction.  At the conclusion of our conversation, the patient is in agreement to proceed with the recommended 2-week course of palliative radiotherapy to the left seventh rib lesion and await expert opinion regarding palliative radiotherapy alone versus postoperative radiotherapy to the left hip.  He will keep his scheduled consult visit with Dr. Janice Coffin on Monday, 05/16/2021 and we will follow up regarding her recommendation.  He has freely signed written consent to proceed with the palliative radiotherapy to the left seventh rib today in the office and a copy of this document will be placed in his medical record.  We will proceed with CT simulation/treatment planning for the left seventh rib following our visit today, in anticipation of beginning his daily radiation treatments on Monday, 05/16/2021.  He will also continue in routine follow-up under the care and direction of Dr. Alen Blew for continued management of his systemic disease with plans to begin his immunotherapy treatments on 05/18/2021.  He is also scheduled for a follow-up visit with Dr. Lovena Neighbours on 05/26/2021 to further discuss treatment recommendations.  We enjoyed meeting him and his wife today and look forward to continuing to participate in his care.  We personally spent 70 minutes in this encounter including chart review, reviewing radiological studies, meeting face-to-face with the patient, entering orders and completing documentation.    Nicholos Johns, PA-C    Tyler Pita, MD  [facility] Direct Dial: [phone]   Fax: [phone] [email or web address]   Skype   [email or web address]

## 2021-05-12 NOTE — Assessment & Plan Note (Signed)
Rib lesion from metastatic renal cell carcinoma.  Managing pain okay right now with over-the-counter analgesics as needed.  He does have a prescription for lidocaine patches as well as tramadol as he has not needed to utilize.  He will let me know if pain is significantly worsening and/or not controlled with currently prescribed medications.

## 2021-05-12 NOTE — Assessment & Plan Note (Signed)
Metastatic lesion to femoral neck.  He has been evaluated by orthopedics.  He will continue to limit weightbearing.

## 2021-05-12 NOTE — Assessment & Plan Note (Signed)
Management per oncology. 

## 2021-05-12 NOTE — Progress Notes (Signed)
Gordon Mcclain - 46 y.o. male MRN [id number]  Date of birth: Nov 26, 1975  Subjective Chief Complaint  Patient presents with   Establish Care    HPI Gordon Mcclain is a 46 year old male here today for initial visit to establish care.  He has been fairly healthy until recently.  Again having rib pain approximately 6 months ago.  Initially thought to be musculoskeletal in nature however never resolved.  Eventually was seen by Dr. Dianah Field who ordered a CT scan of the chest which showed left renal mass with seventh rib metastasis.  He underwent CT-guided biopsy and also had CT abdomen and pelvis for staging.  He was noted to have lytic lesion of the left femoral neck as well.  Surgical pathology with renal cell carcinoma.  He has been evaluated by oncology and is planning to start radiation soon.  He has also been evaluated by orthopedics and recommended to limit weightbearing to the left hip.  Additionally he has had sensation of palpitations and racing heart.  This occurs occasionally.  Seen in the ED last week and had extensive work-up which to rule out cardiac source as well as pulmonary embolism.  Symptoms thought to be related to mass effect from metastatic rib lesion.  ROS:  A comprehensive ROS was completed and negative except as noted per HPI  No Known Allergies  Past Medical History:  Diagnosis Date   Frequent headaches    occipital region mostly: takes ibuprofen 800 mg bid most days   Palpitations    Zio patch reassuring 04/2021   Renal cancer ([facility]) 03/2021   Left->presented with bone met to L rib.   Tobacco dependence     Past Surgical History:  Procedure Laterality Date   PILONIDAL CYST EXCISION  approx 2002   zio patch     04/2021 zio reassuring.    Social History   Socioeconomic History   Marital status: Married    Spouse name: Not on file   Number of children: Not on file   Years of education: Not on file   Highest education level: Not on file  Occupational History    Not on file  Tobacco Use   Smoking status: Former    Packs/day: 1.00    Years: 20.00    Pack years: 20.00    Types: Cigarettes    Quit date: 04/27/2021    Years since quitting: 0.0    Passive exposure: Never   Smokeless tobacco: Never  Vaping Use   Vaping Use: Never used  Substance and Sexual Activity   Alcohol use: No   Drug use: No   Sexual activity: Yes    Partners: Female  Other Topics Concern   Not on file  Social History Narrative   Married, 1 daughter and one son.   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   Orig from [place], [place] area.   Education: HS.   Tob 20  pck-yr hx (current as of 03/2014).   Alc: none.  No hx of drug use.   Caffeine: 3-4 cups coffee/day, 1 cup tea per day.   No exercise.   Social Determinants of Health   Financial Resource Strain: Not on file  Food Insecurity: Not on file  Transportation Needs: Not on file  Physical Activity: Not on file  Stress: Not on file  Social Connections: Not on file    Family History  Problem Relation Age of Onset   Arthritis Mother    Cancer Maternal Aunt    Heart  attack Maternal Uncle    Heart attack Maternal Grandmother    Heart attack Maternal Grandfather    Heart attack Maternal Aunt     Health Maintenance  Topic Date Due   HIV Screening  Never done   Hepatitis C Screening  Never done   INFLUENZA VACCINE  06/24/2021 (Originally 10/25/2020)   COLONOSCOPY (Pts 45-31yr Insurance coverage will need to be confirmed)  05/12/2022 (Originally 08/05/2019)   TETANUS/TDAP  06/20/2029   HPV VACCINES  Aged Out   COVID-19 Vaccine  Discontinued     ----------------------------------------------------------------------------------------------------------------------------------------------------------------------------------------------------------------- Physical Exam BP 124/85 (BP Location: Left Arm, Patient Position: Sitting, Cuff Size: Large)    Pulse 61    Ht _0   (1.651 m)    Wt 187 lb (84.8 kg)    SpO2 100%    BMI 31.12 kg/m   Physical Exam Constitutional:      Appearance: Normal appearance.  Cardiovascular:     Rate and Rhythm: Normal rate and regular rhythm.  Pulmonary:     Effort: Pulmonary effort is normal.     Breath sounds: Normal breath sounds.  Musculoskeletal:     Cervical back: Neck supple.  Neurological:     General: No focal deficit present.     Mental Status: He is alert.  Psychiatric:        Mood and Affect: Mood normal.        Behavior: Behavior normal.    ------------------------------------------------------------------------------------------------------------------------------------------------------------------------------------------------------------------- Assessment and Plan  Rib lesion Rib lesion from metastatic renal cell carcinoma.  Managing pain okay right now with over-the-counter analgesics as needed.  He does have a prescription for lidocaine patches as well as tramadol as he has not needed to utilize.  He will let me know if pain is significantly worsening and/or not controlled with currently prescribed medications.  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  Metastasis to bone ([facility] Metastatic lesion to femoral neck.  He has been evaluated by orthopedics.  He will continue to limit weightbearing.   No orders of the defined types were placed in this encounter.   Return in about 3 months (around 08/09/2021) for F/u RCC.    This visit occurred during the SARS-CoV-2 public health emergency.  Safety protocols were in place, including screening questions prior to the visit, additional usage of staff PPE, and extensive cleaning of exam room while observing appropriate contact time as indicated for disinfecting solutions.

## 2021-05-13 ENCOUNTER — Telehealth: Payer: Self-pay | Admitting: Oncology

## 2021-05-13 DIAGNOSIS — Z5112 Encounter for antineoplastic immunotherapy: Secondary | ICD-10-CM | POA: Diagnosis not present

## 2021-05-13 NOTE — Telephone Encounter (Signed)
This referral was sent on 02/14 and again today 02/17

## 2021-05-13 NOTE — Telephone Encounter (Signed)
Scheduled per 02/14 los, patient has been called and voicemail was left. 

## 2021-05-13 NOTE — Progress Notes (Signed)
°  Radiation Oncology         [phone] ________________________________  Name: Gordon Mcclain MRN: [id number]  Date: 05/12/2021  DOB: 25-Nov-1975  SIMULATION AND TREATMENT PLANNING NOTE    ICD-10-CM   1. 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  Identity was confirmed.  All relevant records and images related to the planned course of therapy were reviewed.  The patient freely provided informed written consent to proceed with treatment after reviewing the details related to the planned course of therapy. The consent form was witnessed and verified by the simulation staff.  Then, the patient was set-up in a stable reproducible  supine position for radiation therapy.  CT images were obtained.  Surface markings were placed.  The CT images were loaded into the planning software.  Then the target and avoidance structures were contoured.  Treatment planning then occurred.  The radiation prescription was entered and confirmed.  Then, I designed and supervised the construction of a total of 3 medically necessary complex treatment devices including body positioner and 2 MLCs to shield heart and lungs.  I have requested : 3D Simulation  I have requested a DVH of the following structures: left lung, heart, spinal cord and targets  PLAN:  The patient will receive 30 Gy in 10 fractions.  ________________________________  Sheral Apley Tammi Klippel, M.D.

## 2021-05-16 ENCOUNTER — Ambulatory Visit
Admission: RE | Admit: 2021-05-16 | Discharge: 2021-05-16 | Disposition: A | Discharge status: Home / Self Care | Payer: 59 | Source: Ambulatory Visit | Attending: Radiation Oncology | Admitting: Radiation Oncology

## 2021-05-16 ENCOUNTER — Other Ambulatory Visit: Payer: Self-pay

## 2021-05-16 DIAGNOSIS — Z5112 Encounter for antineoplastic immunotherapy: Secondary | ICD-10-CM | POA: Diagnosis not present

## 2021-05-17 ENCOUNTER — Encounter: Payer: Self-pay | Admitting: Oncology

## 2021-05-17 ENCOUNTER — Ambulatory Visit
Admission: RE | Admit: 2021-05-17 | Discharge: 2021-05-17 | Disposition: A | Discharge status: Home / Self Care | Payer: 59 | Source: Ambulatory Visit | Attending: Radiation Oncology | Admitting: Radiation Oncology

## 2021-05-17 ENCOUNTER — Inpatient Hospital Stay: Payer: 59

## 2021-05-17 ENCOUNTER — Other Ambulatory Visit: Payer: Self-pay | Admitting: Oncology

## 2021-05-17 DIAGNOSIS — C649 Malignant neoplasm of unspecified kidney, except renal pelvis: Secondary | ICD-10-CM

## 2021-05-17 DIAGNOSIS — Z5112 Encounter for antineoplastic immunotherapy: Secondary | ICD-10-CM | POA: Diagnosis not present

## 2021-05-17 NOTE — Progress Notes (Signed)
Called pt to introduce myself as his Arboriculturist and to discuss copay assistance.  Pt gave me consent to apply in his behalf so I completed the application with [employer] for [employer].  I will fax the completed application to [employer] for processing once I receive his and Dr. Hazeline Junker signature.  I will notify him of the outcome once I receive it.  Pt is overqualified for the [employer].  I will give him my card for any questions or concerns he may have in the future.

## 2021-05-18 ENCOUNTER — Other Ambulatory Visit: Payer: Self-pay

## 2021-05-18 ENCOUNTER — Ambulatory Visit
Admission: RE | Admit: 2021-05-18 | Discharge: 2021-05-18 | Disposition: A | Discharge status: Home / Self Care | Payer: 59 | Source: Ambulatory Visit | Attending: Radiation Oncology | Admitting: Radiation Oncology

## 2021-05-18 ENCOUNTER — Inpatient Hospital Stay (HOSPITAL_BASED_OUTPATIENT_CLINIC_OR_DEPARTMENT_OTHER): Payer: 59

## 2021-05-18 ENCOUNTER — Ambulatory Visit: Payer: 59

## 2021-05-18 ENCOUNTER — Inpatient Hospital Stay: Payer: 59

## 2021-05-18 VITALS — BP 126/72 | HR 75 | Temp 98.8°F | Resp 20 | Ht 65.0 in | Wt 187.5 lb

## 2021-05-18 DIAGNOSIS — C649 Malignant neoplasm of unspecified kidney, except renal pelvis: Secondary | ICD-10-CM

## 2021-05-18 DIAGNOSIS — C7951 Secondary malignant neoplasm of bone: Secondary | ICD-10-CM

## 2021-05-18 DIAGNOSIS — Z5112 Encounter for antineoplastic immunotherapy: Secondary | ICD-10-CM | POA: Diagnosis not present

## 2021-05-18 LAB — CBC WITH DIFFERENTIAL (CANCER CENTER ONLY)
Abs Immature Granulocytes: 0.03 10*3/uL (ref 0.00–0.07)
Basophils Absolute: 0.1 10*3/uL (ref 0.0–0.1)
Basophils Relative: 1 %
Eosinophils Absolute: 0.2 10*3/uL (ref 0.0–0.5)
Eosinophils Relative: 3 %
HCT: 40.8 % (ref 39.0–52.0)
Hemoglobin: 13.7 g/dL (ref 13.0–17.0)
Immature Granulocytes: 0 %
Lymphocytes Relative: 22 %
Lymphs Abs: 1.5 10*3/uL (ref 0.7–4.0)
MCH: 30.6 pg (ref 26.0–34.0)
MCHC: 33.6 g/dL (ref 30.0–36.0)
MCV: 91.1 fL (ref 80.0–100.0)
Monocytes Absolute: 0.4 10*3/uL (ref 0.1–1.0)
Monocytes Relative: 6 %
Neutro Abs: 4.6 10*3/uL (ref 1.7–7.7)
Neutrophils Relative %: 68 %
Platelet Count: 282 10*3/uL (ref 150–400)
RBC: 4.48 MIL/uL (ref 4.22–5.81)
RDW: 11.9 % (ref 11.5–15.5)
WBC Count: 6.7 10*3/uL (ref 4.0–10.5)
nRBC: 0 % (ref 0.0–0.2)

## 2021-05-18 LAB — CMP (CANCER CENTER ONLY)
ALT: 28 U/L (ref 0–44)
AST: 21 U/L (ref 15–41)
Albumin: 4 g/dL (ref 3.5–5.0)
Alkaline Phosphatase: 68 U/L (ref 38–126)
Anion gap: 6 (ref 5–15)
BUN: 16 mg/dL (ref 6–20)
CO2: 29 mmol/L (ref 22–32)
Calcium: 9.4 mg/dL (ref 8.9–10.3)
Chloride: 104 mmol/L (ref 98–111)
Creatinine: 0.68 mg/dL (ref 0.61–1.24)
GFR, Estimated: >60 mL/min (ref >60)
Glucose, Bld: 157 mg/dL — ABNORMAL HIGH (ref 70–99)
Potassium: 3.8 mmol/L (ref 3.5–5.1)
Sodium: 139 mmol/L (ref 135–145)
Total Bilirubin: 0.4 mg/dL (ref 0.3–1.2)
Total Protein: 6.8 g/dL (ref 6.5–8.1)

## 2021-05-18 LAB — TSH: TSH: 0.359 u[IU]/mL (ref 0.320–4.118)

## 2021-05-18 MED ORDER — SODIUM CHLORIDE 0.9 % IV SOLN
3.0000 mg/kg | Freq: Once | INTRAVENOUS | Status: AC
  Administered 2021-05-18: 255 mg via INTRAVENOUS
  Filled 2021-05-18: qty 24

## 2021-05-18 MED ORDER — FAMOTIDINE IN NACL 20-0.9 MG/50ML-% IV SOLN
20.0000 mg | Freq: Once | INTRAVENOUS | Status: AC
  Administered 2021-05-18: 20 mg via INTRAVENOUS
  Filled 2021-05-18: qty 50

## 2021-05-18 MED ORDER — DIPHENHYDRAMINE HCL 50 MG/ML IJ SOLN
25.0000 mg | Freq: Once | INTRAMUSCULAR | Status: AC
  Administered 2021-05-18: 25 mg via INTRAVENOUS
  Filled 2021-05-18: qty 1

## 2021-05-18 MED ORDER — SODIUM CHLORIDE 0.9 % IV SOLN
1.0000 mg/kg | Freq: Once | INTRAVENOUS | Status: AC
  Administered 2021-05-18: 85 mg via INTRAVENOUS
  Filled 2021-05-18: qty 17

## 2021-05-18 MED ORDER — SODIUM CHLORIDE 0.9 % IV SOLN: Freq: Once | INTRAVENOUS | Status: AC

## 2021-05-18 NOTE — Patient Instructions (Signed)
[facility]  Discharge Instructions: Thank you for choosing [facility] to provide your oncology and hematology care.   If you have a lab appointment with the [facility], please go directly to the [facility] and check in at the registration area.   Wear comfortable clothing and clothing appropriate for easy access to any Portacath or PICC line.   We strive to give you quality time with your provider. You may need to reschedule your appointment if you arrive late (15 or more minutes).  Arriving late affects you and other patients whose appointments are after yours.  Also, if you miss three or more appointments without notifying the office, you may be dismissed from the clinic at the providers discretion.      For prescription refill requests, have your pharmacy contact our office and allow 72 hours for refills to be completed.    Today you received the following chemotherapy and/or immunotherapy agents: yervoy and Opdivo     To help prevent nausea and vomiting after your treatment, we encourage you to take your nausea medication as directed.  BELOW ARE SYMPTOMS THAT SHOULD BE REPORTED IMMEDIATELY: *FEVER GREATER THAN 100.4 F (38 C) OR HIGHER *CHILLS OR SWEATING *NAUSEA AND VOMITING THAT IS NOT CONTROLLED WITH YOUR NAUSEA MEDICATION *UNUSUAL SHORTNESS OF BREATH *UNUSUAL BRUISING OR BLEEDING *URINARY PROBLEMS (pain or burning when urinating, or frequent urination) *BOWEL PROBLEMS (unusual diarrhea, constipation, pain near the anus) TENDERNESS IN MOUTH AND THROAT WITH OR WITHOUT PRESENCE OF ULCERS (sore throat, sores in mouth, or a toothache) UNUSUAL RASH, SWELLING OR PAIN  UNUSUAL VAGINAL DISCHARGE OR ITCHING   Items with * indicate a potential emergency and should be followed up as soon as possible or go to the Emergency Department if any problems should occur.  Please show the CHEMOTHERAPY ALERT CARD or IMMUNOTHERAPY ALERT CARD at  check-in to the Emergency Department and triage nurse.  Should you have questions after your visit or need to cancel or reschedule your appointment, please contact [facility]  Dept: [phone]  and follow the prompts.  Office hours are 8:00 a.m. to 4:30 p.m. Monday - Friday. Please note that voicemails left after 4:00 p.m. may not be returned until the following business day.  We are closed weekends and major holidays. You have access to a nurse at all times for urgent questions. Please call the main number to the clinic Dept: [phone] and follow the prompts.   For any non-urgent questions, you may also contact your provider using MyChart. We now offer e-Visits for anyone 73 and older to request care online for non-urgent symptoms. For details visit [email or web address].   Also download the MyChart app! Go to the app store, search "MyChart", open the app, select [facility], and log in with your MyChart username and password.  Due to Covid, a mask is required upon entering the hospital/clinic. If you do not have a mask, one will be given to you upon arrival. For doctor visits, patients may have 1 support person aged 9 or older with them. For treatment visits, patients cannot have anyone with them due to current Covid guidelines and our immunocompromised population.

## 2021-05-18 NOTE — Progress Notes (Signed)
Ipilimumab (YERVOY) Patient Monitoring Assessment   Is the patient experiencing any of the following general symptoms?:  [x] Difficulty performing normal activities [] Feeling sluggish or cold all the time [] Unusual weight gain [] Constant or unusual headaches [x] Feeling dizzy or faint [] Changes in eyesight (blurry vision, double vision, or other vision problems) [] Changes in mood or behavior (ex: decreased sex drive, irritability, or forgetfulness) [] Starting new medications (ex: steroids, other medications that lower immune response) [] Patient is not experiencing any of the general symptoms above.    Gastrointestinal  Patient is having 1-2 bowel movements each day.  Is this different from baseline? [] Yes [x] No Are your stools watery or do they have a foul smell? [] Yes [x] No Have you seen blood in your stools? [] Yes [x] No Are your stools dark, tarry, or sticky? [] Yes [x] No Are you having pain or tenderness in your belly? [] Yes [x] No  Skin Does your skin itch? [] Yes [x] No Do you have a rash? [] Yes [x] No Has your skin blistered and/or peeled? [] Yes [x] No Do you have sores in your mouth? [] Yes [x] No  Hepatic Has your urine been dark or tea colored? [] Yes [x] No Have you noticed that your skin or the whites of your eyes are turning yellow? [] Yes [x] No Are you bleeding or bruising more easily than normal? [] Yes [x] No Are you nauseous and/or vomiting? [] Yes [x] No Do you have pain on the right side of your stomach? [] Yes [x] No  Neurologic  Are you having unusual weakness of legs, arms, or face? [] Yes [x] No Are you having numbness or tingling in your hands or feet? [] Yes [x] No  Anastasia Pall

## 2021-05-19 ENCOUNTER — Telehealth: Payer: Self-pay

## 2021-05-19 ENCOUNTER — Other Ambulatory Visit: Payer: Self-pay

## 2021-05-19 ENCOUNTER — Ambulatory Visit
Admission: RE | Admit: 2021-05-19 | Discharge: 2021-05-19 | Disposition: A | Discharge status: Home / Self Care | Payer: 59 | Source: Ambulatory Visit | Attending: Radiation Oncology | Admitting: Radiation Oncology

## 2021-05-19 DIAGNOSIS — Z5112 Encounter for antineoplastic immunotherapy: Secondary | ICD-10-CM | POA: Diagnosis not present

## 2021-05-19 NOTE — Telephone Encounter (Signed)
Mr. Gordon Mcclain states that he is doing fine. He is eating, drinking, and urinating well.  He knows to call the office at [phone] if he has any questions or concerns.

## 2021-05-19 NOTE — Telephone Encounter (Signed)
-----   Message from Anastasia Pall, RN sent at 05/18/2021  4:54 PM EST ----- Regarding: 1st time yervoy/opdivo, being seen by Dr. Alen Blew. Patient received 1st time yervoy and opdivo infusion today. He tolerated the treatment well, being followed by Dr. Alen Blew. Please follow-up with him tomorrow. Thank you.

## 2021-05-20 ENCOUNTER — Telehealth: Payer: Self-pay | Admitting: Oncology

## 2021-05-20 ENCOUNTER — Ambulatory Visit
Admission: RE | Admit: 2021-05-20 | Discharge: 2021-05-20 | Disposition: A | Discharge status: Home / Self Care | Payer: 59 | Source: Ambulatory Visit | Attending: Radiation Oncology | Admitting: Radiation Oncology

## 2021-05-20 ENCOUNTER — Ambulatory Visit: Payer: 59

## 2021-05-20 ENCOUNTER — Encounter: Payer: Self-pay | Admitting: Urology

## 2021-05-20 DIAGNOSIS — Z5112 Encounter for antineoplastic immunotherapy: Secondary | ICD-10-CM | POA: Diagnosis not present

## 2021-05-20 NOTE — Telephone Encounter (Signed)
Called patient regarding upcoming March and April appointments, patient is notified.

## 2021-05-23 ENCOUNTER — Ambulatory Visit
Admission: RE | Admit: 2021-05-23 | Discharge: 2021-05-23 | Disposition: A | Discharge status: Home / Self Care | Payer: 59 | Source: Ambulatory Visit | Attending: Radiation Oncology | Admitting: Radiation Oncology

## 2021-05-23 ENCOUNTER — Other Ambulatory Visit: Payer: Self-pay | Admitting: Oncology

## 2021-05-23 ENCOUNTER — Encounter: Payer: Self-pay | Admitting: Oncology

## 2021-05-23 ENCOUNTER — Other Ambulatory Visit: Payer: Self-pay

## 2021-05-23 ENCOUNTER — Other Ambulatory Visit: Payer: Self-pay | Admitting: Radiation Oncology

## 2021-05-23 ENCOUNTER — Encounter: Payer: Self-pay | Admitting: Radiation Oncology

## 2021-05-23 DIAGNOSIS — Z5112 Encounter for antineoplastic immunotherapy: Secondary | ICD-10-CM | POA: Diagnosis not present

## 2021-05-23 DIAGNOSIS — C7951 Secondary malignant neoplasm of bone: Secondary | ICD-10-CM

## 2021-05-23 MED ORDER — DIPHENOXYLATE-ATROPINE 2.5-0.025 MG PO TABS: 1.0000 | ORAL_TABLET | Freq: Four times a day (QID) | ORAL | 0 refills | Status: DC | PRN

## 2021-05-23 MED ORDER — SONAFINE EX EMUL
1.0000 "application " | Freq: Once | CUTANEOUS | Status: AC
  Administered 2021-05-23: 1 via TOPICAL

## 2021-05-23 NOTE — Progress Notes (Signed)
Per OV note 05/16/21 visit with Dr. Mylo Red at [facility]:  -discussed conservative vs operative treatment options -there is a high risk of impending fracture with this lytic lesion given its location and it has breached the anterior cortex of the femoral neck; recommend radical resection of L hip tumor and reconstruction with L hip cemented hemiarthroplasty -also discussed there will be need for pre-op embolization. 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 The patient is aware of these warning signs and will contact my office if these symptoms develop during the time interval until our next visit.   He is scheduled for tumor resection and left hemiarthroplasty 05/26/21.   Nicholos Johns, MMS, PA-C [facility] at [facility]: [phone]   Fax: [phone]

## 2021-05-23 NOTE — Progress Notes (Signed)
Pt is approved w/ [employer] to receive copay assistance for Opdivo and Yervoy from 05/19/21 to 03/26/22.  Pt may pay as little as $0 per medication, per dose.  The program will cover the remainder of the copay, up to a maximum of $25,000 per [facility] medication during the calendar year (Jan 1st through Dec 31st).

## 2021-05-23 NOTE — Progress Notes (Signed)
Gordon Mcclain reports diarrhea over the weekend with 6-7 loose bowel habits yesterday and 3 today.  He reports he has not used any Imodium or Lomotil.  He does not report any nausea or vomiting.  He denies abdominal pain, hematochezia or mucus production.  He is able to drink without any difficulties and hydrating properly.  He is not eating as well.  His symptoms are consistent with mild colitis associated with immunotherapy.  Recommended supportive management and the use of Imodium with clear instructions how to do that.  Prescription for Lomotil was also given to him.  If his diarrhea is not controlled in the next 24 to 48 hours prednisone will be used at that time.  He appears to be clinically stable and nontoxic-appearing.  He understands these instructions and all his questions were answered.

## 2021-05-24 ENCOUNTER — Ambulatory Visit
Admission: RE | Admit: 2021-05-24 | Discharge: 2021-05-24 | Disposition: A | Discharge status: Home / Self Care | Payer: 59 | Source: Ambulatory Visit | Attending: Radiation Oncology | Admitting: Radiation Oncology

## 2021-05-24 ENCOUNTER — Encounter: Payer: Self-pay | Admitting: Urology

## 2021-05-24 DIAGNOSIS — Z5112 Encounter for antineoplastic immunotherapy: Secondary | ICD-10-CM | POA: Diagnosis not present

## 2021-05-24 DIAGNOSIS — C7951 Secondary malignant neoplasm of bone: Secondary | ICD-10-CM

## 2021-05-25 ENCOUNTER — Ambulatory Visit: Payer: 59

## 2021-05-26 ENCOUNTER — Ambulatory Visit: Payer: 59

## 2021-05-27 ENCOUNTER — Ambulatory Visit: Payer: 59

## 2021-05-29 ENCOUNTER — Encounter: Payer: Self-pay | Admitting: Oncology

## 2021-05-30 ENCOUNTER — Telehealth: Payer: Self-pay | Admitting: *Deleted

## 2021-05-30 NOTE — Telephone Encounter (Signed)
Connected with call on speaker to spouse Gordon Mcclain [phone]) audible for Gordon Mcclain.  Advised form is incomplete as surgical, chemotherapy and radiotherapy billing codes, pathology report, policy number and patient signature required to validate claim to return packet of information to [facility].  [facility] office site does not manage billing or release of health information.  Invalid claims are not reviewed but discarded leaving claimant to start over.  ? ?Gordon Mcclain requested e-mailing release.  This forms nurse sent an authorization release for use and disclosure for patient signature with tip sheet providing Billing phone: [phone].  Advised to ask [employer] if they'll accept insurance company EOB's.  Connect with Gordon Mcclain of radiation office to request radiotherapy billing information if this treatment is completed.   ?Gordon Mcclain confirmed receipt information e-mailed to [email or web address] with this call.  "We already have received the pathology report.  But will return release to [email or web address] for the completed form."       ?

## 2021-05-31 NOTE — Telephone Encounter (Signed)
Cancer Claim successfully faxed to [facility] at [phone] with pathology [phone] and billing information received via e-mail.  Original copy mailed to patient address on file.  ?[place] [id number] ?  ?Copy to "Record Release" bin in front office area before [facility] for [facility].I.M. staff to forward to [facility] with requested PHI attached to include but not limited to the following pertinent medical records: ?"Progress Notes, diagnostic test results, discharge summaries, opeqative reports, consultation reports, and mental status exams if applicable to help expedite processing of claim" to [facility] ([facility]) Information Management Office, Phone: [phone], Fax: [phone] to complete this process  ?

## 2021-06-08 ENCOUNTER — Inpatient Hospital Stay: Payer: 59 | Admitting: Oncology

## 2021-06-08 ENCOUNTER — Inpatient Hospital Stay: Payer: 59

## 2021-06-08 ENCOUNTER — Other Ambulatory Visit: Payer: Self-pay

## 2021-06-08 ENCOUNTER — Inpatient Hospital Stay: Payer: 59 | Attending: Physician Assistant

## 2021-06-08 VITALS — HR 99

## 2021-06-08 VITALS — BP 110/70 | HR 105 | Temp 97.8°F | Resp 18 | Ht 65.0 in | Wt 180.2 lb

## 2021-06-08 DIAGNOSIS — C7951 Secondary malignant neoplasm of bone: Secondary | ICD-10-CM | POA: Diagnosis present

## 2021-06-08 DIAGNOSIS — C642 Malignant neoplasm of left kidney, except renal pelvis: Secondary | ICD-10-CM | POA: Insufficient documentation

## 2021-06-08 DIAGNOSIS — N2889 Other specified disorders of kidney and ureter: Secondary | ICD-10-CM

## 2021-06-08 DIAGNOSIS — Z5112 Encounter for antineoplastic immunotherapy: Secondary | ICD-10-CM | POA: Diagnosis present

## 2021-06-08 DIAGNOSIS — Z79899 Other long term (current) drug therapy: Secondary | ICD-10-CM | POA: Diagnosis not present

## 2021-06-08 DIAGNOSIS — M899 Disorder of bone, unspecified: Secondary | ICD-10-CM

## 2021-06-08 DIAGNOSIS — C649 Malignant neoplasm of unspecified kidney, except renal pelvis: Secondary | ICD-10-CM | POA: Diagnosis not present

## 2021-06-08 LAB — CMP (CANCER CENTER ONLY)
ALT: 40 U/L (ref 0–44)
AST: 20 U/L (ref 15–41)
Albumin: 3.9 g/dL (ref 3.5–5.0)
Alkaline Phosphatase: 91 U/L (ref 38–126)
Anion gap: 8 (ref 5–15)
BUN: 23 mg/dL — ABNORMAL HIGH (ref 6–20)
CO2: 29 mmol/L (ref 22–32)
Calcium: 9.9 mg/dL (ref 8.9–10.3)
Chloride: 102 mmol/L (ref 98–111)
Creatinine: 0.67 mg/dL (ref 0.61–1.24)
GFR, Estimated: >60 mL/min (ref >60)
Glucose, Bld: 106 mg/dL — ABNORMAL HIGH (ref 70–99)
Potassium: 3.6 mmol/L (ref 3.5–5.1)
Sodium: 139 mmol/L (ref 135–145)
Total Bilirubin: 0.8 mg/dL (ref 0.3–1.2)
Total Protein: 7.4 g/dL (ref 6.5–8.1)

## 2021-06-08 LAB — CBC WITH DIFFERENTIAL (CANCER CENTER ONLY)
Abs Immature Granulocytes: 0.16 10*3/uL — ABNORMAL HIGH (ref 0.00–0.07)
Basophils Absolute: 0.1 10*3/uL (ref 0.0–0.1)
Basophils Relative: 1 %
Eosinophils Absolute: 0.4 10*3/uL (ref 0.0–0.5)
Eosinophils Relative: 5 %
HCT: 37.4 % — ABNORMAL LOW (ref 39.0–52.0)
Hemoglobin: 12.5 g/dL — ABNORMAL LOW (ref 13.0–17.0)
Immature Granulocytes: 2 %
Lymphocytes Relative: 13 %
Lymphs Abs: 1.1 10*3/uL (ref 0.7–4.0)
MCH: 30.4 pg (ref 26.0–34.0)
MCHC: 33.4 g/dL (ref 30.0–36.0)
MCV: 91 fL (ref 80.0–100.0)
Monocytes Absolute: 0.8 10*3/uL (ref 0.1–1.0)
Monocytes Relative: 8 %
Neutro Abs: 6.4 10*3/uL (ref 1.7–7.7)
Neutrophils Relative %: 71 %
Platelet Count: 525 10*3/uL — ABNORMAL HIGH (ref 150–400)
RBC: 4.11 MIL/uL — ABNORMAL LOW (ref 4.22–5.81)
RDW: 12.7 % (ref 11.5–15.5)
WBC Count: 9 10*3/uL (ref 4.0–10.5)
nRBC: 0 % (ref 0.0–0.2)

## 2021-06-08 LAB — TSH: TSH: <0.08 u[IU]/mL — ABNORMAL LOW (ref 0.320–4.118)

## 2021-06-08 MED ORDER — SODIUM CHLORIDE 0.9 % IV SOLN
2.9400 mg/kg | Freq: Once | INTRAVENOUS | Status: AC
  Administered 2021-06-08: 240 mg via INTRAVENOUS
  Filled 2021-06-08: qty 24

## 2021-06-08 MED ORDER — SODIUM CHLORIDE 0.9 % IV SOLN
1.0000 mg/kg | Freq: Once | INTRAVENOUS | Status: AC
  Administered 2021-06-08: 85 mg via INTRAVENOUS
  Filled 2021-06-08: qty 17

## 2021-06-08 MED ORDER — TRIAMCINOLONE ACETONIDE 0.5 % EX OINT: 1.0000 "application " | TOPICAL_OINTMENT | Freq: Two times a day (BID) | CUTANEOUS | 0 refills | Status: DC

## 2021-06-08 MED ORDER — FAMOTIDINE IN NACL 20-0.9 MG/50ML-% IV SOLN
20.0000 mg | Freq: Once | INTRAVENOUS | Status: AC
  Administered 2021-06-08: 20 mg via INTRAVENOUS
  Filled 2021-06-08: qty 50

## 2021-06-08 MED ORDER — DIPHENHYDRAMINE HCL 50 MG/ML IJ SOLN
25.0000 mg | Freq: Once | INTRAMUSCULAR | Status: AC
  Administered 2021-06-08: 25 mg via INTRAVENOUS
  Filled 2021-06-08: qty 1

## 2021-06-08 MED ORDER — SODIUM CHLORIDE 0.9 % IV SOLN: Freq: Once | INTRAVENOUS | Status: AC

## 2021-06-08 NOTE — Progress Notes (Signed)
Hematology and Oncology Follow Up Visit ? ?Gordon Mcclain ?[id number] ?10-Sep-1975 46 y.o. ?06/08/2021 8:18 AM ?[name], DOMcGowen, Adrian Blackwater, MD  ? ?Principle Diagnosis: 46 year old with stage IV clear-cell renal cell carcinoma with left renal mass with involvement of the left hip and subcutaneous nodules diagnosed in January 2023.  He has intermediate risk disease. ? ? ?Prior Therapy: ? ?He is status post left hip surgery completed in March 2023.  He underwent left hemiarthroplasty at that time. ? ?Palliative radiation therapy to the left ribs with total of 30 Gray in 10 fractions completed in February 2023. ? ?Current therapy: ? ?Ipilimumab 1 mg/kg and nivolumab 3 mg/kg started on May 18, 2021.  He is here for cycle 2 of therapy. ? ?Interim History: Gordon Mcclain returns today for a follow-up visit.  Since the last visit, he completed the first cycle of immunotherapy without any major complications.  He had grade 1 diarrhea which has resolved at this time and was manageable with Imodium.  He denies any nausea, vomiting.  He does report some pruritus but no major eruptions or skin rash.  He is able to ambulate short distances with the help of a cane without any falls or syncope. ? ? ? ? ?Medications: I have reviewed the patient's current medications.  ?Current Outpatient Medications  ?Medication Sig Dispense Refill  ? acetaminophen (TYLENOL) 325 MG tablet Take 650 mg by mouth every 6 (six) hours as needed.    ? cyclobenzaprine (FLEXERIL) 10 MG tablet Take by mouth. (Patient not taking: Reported on 12/01/2020)    ? diphenoxylate-atropine (LOMOTIL) 2.5-0.025 MG tablet Take 1 tablet by mouth 4 (four) times daily as needed for diarrhea or loose stools. 30 tablet 0  ? ibuprofen (ADVIL) 800 MG tablet Take by mouth. (Patient not taking: Reported on 05/12/2021)    ? lidocaine (LIDODERM) 5 % Place 1 patch onto the skin daily. Remove & Discard patch within 12 hours or as directed by MD 30 patch 0  ? prochlorperazine  (COMPAZINE) 10 MG tablet Take 1 tablet (10 mg total) by mouth every 6 (six) hours as needed for nausea or vomiting. 30 tablet 0  ? traMADol (ULTRAM) 50 MG tablet Take 1 tablet (50 mg total) by mouth every 6 (six) hours as needed for moderate pain. 30 tablet 0  ? ?No current facility-administered medications for this visit.  ? ? ? ?Allergies: No Known Allergies ? ? ?Physical Exam: ? ?ECOG:  ? ? ? ?General appearance: Comfortable appearing without any discomfort ?Head: Normocephalic without any trauma ?Oropharynx: Mucous membranes are moist and pink without any thrush or ulcers. ?Eyes: Pupils are equal and round reactive to light. ?Lymph nodes: No cervical, supraclavicular, inguinal or axillary lymphadenopathy.   ?Heart:regular rate and rhythm.  S1 and S2 without leg edema. ?Lung: Clear without any rhonchi or wheezes.  No dullness to percussion. ?Abdomin: Soft, nontender, nondistended with good bowel sounds.  No hepatosplenomegaly. ?Musculoskeletal: No joint deformity or effusion.  Full range of motion noted. ?Neurological: No deficits noted on motor, sensory and deep tendon reflex exam. ?Skin: No petechial rash or dryness.  Appeared moist.  ? ? ? ? ?Lab Results: ?Lab Results  ?Component Value Date  ? WBC 6.7 05/18/2021  ? HGB 13.7 05/18/2021  ? HCT 40.8 05/18/2021  ? MCV 91.1 05/18/2021  ? PLT 282 05/18/2021  ? ?  Chemistry   ?   ?Component Value Date/Time  ? NA 139 05/18/2021 1317  ? K 3.8 05/18/2021 1317  ? CL  104 05/18/2021 1317  ? CO2 29 05/18/2021 1317  ? BUN 16 05/18/2021 1317  ? CREATININE 0.68 05/18/2021 1317  ?    ?Component Value Date/Time  ? CALCIUM 9.4 05/18/2021 1317  ? ALKPHOS 68 05/18/2021 1317  ? AST 21 05/18/2021 1317  ? ALT 28 05/18/2021 1317  ? BILITOT 0.4 05/18/2021 1317  ?  ? ? ? ? ? ? ?Impression and Plan: ? ? ?46 year old with: ? ?1.  Kidney cancer diagnosed in January 2023.  He was found to have stage IV clear-cell renal cell carcinoma with intermediate risk.  He has documented metastasis to  the ribs, left hip and subcutaneous nodules.  ? ? ?He is currently receiving combination immunotherapy without any major complications.  Risks and benefits of proceeding with cycle 2 were discussed.  Immune mediated issues were reiterated again as well as dermatological toxicities.  Plan is to complete 4 cycles of therapy. ? ? ?2.  IV access: Peripheral veins are currently in use at this time.  No complications noted. ?  ?3.  Antiemetics: No nausea or vomiting reported at this time.  Compazine is available to him. ? ?4.  Left hip metastasis: He is status post left hemiarthroplasty and recovering well.  He is ambulating with the help of a cane. ?  ?5.  Seventh rib metastasis: He is status post radiation to that hip with improvement in his pain.  He received 30 Gray in 10 fractions. ?  ?6.  Immunotherapy surveillance: He has experienced grade 1 diarrhea as well as skin rash.  Overall symptoms are manageable without any dose reduction or delay.  I continue to educate him about serious complication including colitis, hepatitis and hypophysitis. ?  ?7.  CNS screening: He will require MRI of the brain at some point to rule out CNS metastasis. ? ?8.  Follow-up: In 3 weeks for repeat follow-up. ?  ?  ?30  minutes were spent on this encounter.  The time was dedicated to reviewing laboratory data, disease status update, treatment choices and addressing complications related to cancer and cancer therapy. ? ? ?Zola Button, MD ?3/15/20238:18 AM ? ?

## 2021-06-08 NOTE — Progress Notes (Signed)
Ipilimumab (YERVOY) Patient Monitoring Assessment  ? ?Is the patient experiencing any of the following general symptoms?:  ?'[]'$ Difficulty performing normal activities ?'[]'$ Feeling sluggish or cold all the time ?'[]'$ Unusual weight gain ?'[]'$ Constant or unusual headaches ?'[]'$ Feeling dizzy or faint ?'[]'$ Changes in eyesight (blurry vision, double vision, or other vision problems) ?'[]'$ Changes in mood or behavior (ex: decreased sex drive, irritability, or forgetfulness) ?'[]'$ Starting new medications (ex: steroids, other medications that lower immune response) ?'[x]'$ Patient is not experiencing any of the general symptoms above.  ? ? ?Gastrointestinal  ?Patient is having 2 bowel movements each day.  ?Is this different from baseline? '[]'$ Yes '[x]'$ No ?Are your stools watery or do they have a foul smell? '[]'$ Yes '[x]'$ No ?Have you seen blood in your stools? '[]'$ Yes '[x]'$ No ?Are your stools dark, tarry, or sticky? '[]'$ Yes '[x]'$ No ?Are you having pain or tenderness in your belly? '[]'$ Yes '[x]'$ No ? ?Skin ?Does your skin itch? '[x]'$ Yes '[]'$ No ?Do you have a rash? '[x]'$ Yes '[]'$ No ?Has your skin blistered and/or peeled? '[]'$ Yes '[x]'$ No ?Do you have sores in your mouth? '[]'$ Yes '[x]'$ No  ?Pt saw Dr. Alen Blew and he prescibed topical cream for rash. ?Hepatic ?Has your urine been dark or tea colored? '[]'$ Yes '[x]'$ No ?Have you noticed that your skin or the whites of your eyes are turning yellow? '[]'$ Yes '[x]'$ No ?Are you bleeding or bruising more easily than normal? '[]'$ Yes '[x]'$ No ?Are you nauseous and/or vomiting? '[]'$ Yes '[x]'$ No ?Do you have pain on the right side of your stomach? '[]'$ Yes '[]'$ No ? ?Neurologic  ?Are you having unusual weakness of legs, arms, or face? '[]'$ Yes '[x]'$ No ?Are you having numbness or tingling in your hands or feet? '[]'$ Yes '[x]'$ No ? ?Charlaine Dalton ? ?

## 2021-06-08 NOTE — Patient Instructions (Signed)
[facility]  Discharge Instructions: ?Thank you for choosing [facility] to provide your oncology and hematology care.  ? ?If you have a lab appointment with the [facility], please go directly to the [facility] and check in at the registration area. ?  ?Wear comfortable clothing and clothing appropriate for easy access to any Portacath or PICC line.  ? ?We strive to give you quality time with your provider. You may need to reschedule your appointment if you arrive late (15 or more minutes).  Arriving late affects you and other patients whose appointments are after yours.  Also, if you miss three or more appointments without notifying the office, you may be dismissed from the clinic at the provider?s discretion.    ?  ?For prescription refill requests, have your pharmacy contact our office and allow 72 hours for refills to be completed.   ? ?Today you received the following chemotherapy and/or immunotherapy agents: Yervoy and Opdivo   ?  ?To help prevent nausea and vomiting after your treatment, we encourage you to take your nausea medication as directed. ? ?BELOW ARE SYMPTOMS THAT SHOULD BE REPORTED IMMEDIATELY: ?*FEVER GREATER THAN 100.4 F (38 ?C) OR HIGHER ?*CHILLS OR SWEATING ?*NAUSEA AND VOMITING THAT IS NOT CONTROLLED WITH YOUR NAUSEA MEDICATION ?*UNUSUAL SHORTNESS OF BREATH ?*UNUSUAL BRUISING OR BLEEDING ?*URINARY PROBLEMS (pain or burning when urinating, or frequent urination) ?*BOWEL PROBLEMS (unusual diarrhea, constipation, pain near the anus) ?TENDERNESS IN MOUTH AND THROAT WITH OR WITHOUT PRESENCE OF ULCERS (sore throat, sores in mouth, or a toothache) ?UNUSUAL RASH, SWELLING OR PAIN  ?UNUSUAL VAGINAL DISCHARGE OR ITCHING  ? ?Items with * indicate a potential emergency and should be followed up as soon as possible or go to the Emergency Department if any problems should occur. ? ?Please show the CHEMOTHERAPY ALERT CARD or IMMUNOTHERAPY ALERT CARD at  check-in to the Emergency Department and triage nurse. ? ?Should you have questions after your visit or need to cancel or reschedule your appointment, please contact [facility]  Dept: [phone]  and follow the prompts.  Office hours are 8:00 a.m. to 4:30 p.m. Monday - Friday. Please note that voicemails left after 4:00 p.m. may not be returned until the following business day.  We are closed weekends and major holidays. You have access to a nurse at all times for urgent questions. Please call the main number to the clinic Dept: [phone] and follow the prompts. ? ? ?For any non-urgent questions, you may also contact your provider using MyChart. We now offer e-Visits for anyone 43 and older to request care online for non-urgent symptoms. For details visit [email or web address]. ?  ?Also download the MyChart app! Go to the app store, search "MyChart", open the app, select [facility], and log in with your MyChart username and password. ? ?Due to Covid, a mask is required upon entering the hospital/clinic. If you do not have a mask, one will be given to you upon arrival. For doctor visits, patients may have 1 support person aged 41 or older with them. For treatment visits, patients cannot have anyone with them due to current Covid guidelines and our immunocompromised population.  ? ?

## 2021-06-21 ENCOUNTER — Encounter: Payer: Self-pay | Admitting: Urology

## 2021-06-21 NOTE — Progress Notes (Signed)
Telephone follow-up. I verified patient identity and began nursing interview. Patient doing well overall. Reports no issues at this time. ? ?Meaningful use complete. ? ?Patient reminded of his 11:00am-06/22/21 telephone appointment w/ Ashlyn Bruning PA-C. I left my extension [phone] in case patient needs anything. ? ?Patient contact [phone] ?

## 2021-06-22 ENCOUNTER — Ambulatory Visit
Admission: RE | Admit: 2021-06-22 | Discharge: 2021-06-22 | Disposition: A | Discharge status: Home / Self Care | Payer: 59 | Source: Ambulatory Visit | Attending: Urology | Admitting: Urology

## 2021-06-22 DIAGNOSIS — C649 Malignant neoplasm of unspecified kidney, except renal pelvis: Secondary | ICD-10-CM

## 2021-06-22 NOTE — Progress Notes (Signed)
?Radiation Oncology         [phone] ?________________________________ ? ?Name: Gordon Mcclain MRN: [id number]  ?Date: 06/22/2021  DOB: 04/26/1975 ? ?Post Treatment Note ? ?CC: [name], DO  Orson Slick, MD ? ?Diagnosis:   46 yo man with painful left rib metastasis from metastatic renal cell carcinoma    ? ?Interval Since Last Radiation:  4 weeks  ?05/16/21 - 05/24/21: The painful metastatic lesion in the left seventh rib was treated to 30 Gy in 10 fractions of 3 Gy each. ? ?Narrative:  I spoke with the patient to conduct his routine scheduled 1 month follow up visit via telephone to spare the patient unnecessary potential exposure in the healthcare setting during the current COVID-19 pandemic.  The patient was notified in advance and gave permission to proceed with this visit format. ? ?He tolerated radiation treatment relatively well with only mild fatigue and mild skin irritation in the treatment field.                             ? ?On review of systems, the patient states that he is doing well in general.  He does continue with mild fatigue but reports that the skin irritation in the treatment field has completely resolved at this point.  He is not having any pain in the ribs.  He did have successful tumor resection and left hemiarthroplasty under the care and direction of Dr. Mylo Red at [facility] on 05/26/2021 and is recovering well from this procedure.  He continues working with physical therapy and feels like he is making good progress.  He has a follow-up visit with Dr. Mylo Red on 07/12/2021 and will further discuss any recommendations for adjuvant radiotherapy at that time.  He is on combo immunotherapy with ipilimumab/nivolumab and feels like he is tolerating this well.  Overall, he is quite pleased with his progress to date. ? ?ALLERGIES:  has No Known Allergies. ? ?Meds: ?Current Outpatient Medications  ?Medication Sig Dispense Refill  ? acetaminophen (TYLENOL) 325 MG tablet Take 650 mg by  mouth every 6 (six) hours as needed.    ? cyclobenzaprine (FLEXERIL) 10 MG tablet Take by mouth. (Patient not taking: Reported on 12/01/2020)    ? diphenoxylate-atropine (LOMOTIL) 2.5-0.025 MG tablet Take 1 tablet by mouth 4 (four) times daily as needed for diarrhea or loose stools. 30 tablet 0  ? ibuprofen (ADVIL) 800 MG tablet Take by mouth. (Patient not taking: Reported on 05/12/2021)    ? lidocaine (LIDODERM) 5 % Place 1 patch onto the skin daily. Remove & Discard patch within 12 hours or as directed by MD 30 patch 0  ? prochlorperazine (COMPAZINE) 10 MG tablet Take 1 tablet (10 mg total) by mouth every 6 (six) hours as needed for nausea or vomiting. 30 tablet 0  ? traMADol (ULTRAM) 50 MG tablet Take 1 tablet (50 mg total) by mouth every 6 (six) hours as needed for moderate pain. 30 tablet 0  ? triamcinolone ointment (KENALOG) 0.5 % Apply 1 application. topically 2 (two) times daily. 30 g 0  ? ?No current facility-administered medications for this encounter.  ? ? ?Physical Findings: ? vitals were not taken for this visit.  ?Pain Assessment ?Pain Score: 0-No pain/10 ?Unable to assess due to telephone follow-up visit format. ? ?Lab Findings: ?Lab Results  ?Component Value Date  ? WBC 9.0 06/08/2021  ? HGB 12.5 (L) 06/08/2021  ? HCT 37.4 (L) 06/08/2021  ?  MCV 91.0 06/08/2021  ? PLT 525 (H) 06/08/2021  ? ? ? ?Radiographic Findings: ?No results found. ? ?Impression/Plan: ?45. 46 yo man with painful left rib metastasis from metastatic renal cell carcinoma. ?He appears to have recovered well from the effects of his recent palliative radiotherapy to the left seventh rib and is currently without complaints. He has a follow-up visit with Dr. Mylo Red on 07/12/2021 and will further discuss any recommendations for adjuvant postoperative radiotherapy to the left hip at that time.  I will plan to follow-up with him shortly thereafter and if advised, we will proceed with treatment planning accordingly.  He will also continue in  routine follow-up under the care and direction of Dr. Alen Blew for continued management of his systemic disease with plans to complete 4 cycles of combo immunotherapy with ipilimumab/nivolumab.  He knows that he is welcome to call at anytime in the interim with any questions or concerns related to his previous radiation. ? ? ? ?Nicholos Johns, PA-C  ?

## 2021-06-24 ENCOUNTER — Other Ambulatory Visit: Payer: Self-pay | Admitting: Urology

## 2021-06-29 ENCOUNTER — Inpatient Hospital Stay: Payer: 59

## 2021-06-29 ENCOUNTER — Inpatient Hospital Stay: Payer: 59 | Admitting: Oncology

## 2021-06-29 ENCOUNTER — Other Ambulatory Visit: Payer: Self-pay

## 2021-06-29 VITALS — BP 116/76 | HR 98 | Temp 97.9°F | Resp 16 | Ht 65.0 in | Wt 171.9 lb

## 2021-06-29 DIAGNOSIS — Z510 Encounter for antineoplastic radiation therapy: Secondary | ICD-10-CM | POA: Diagnosis present

## 2021-06-29 DIAGNOSIS — Z5112 Encounter for antineoplastic immunotherapy: Secondary | ICD-10-CM | POA: Insufficient documentation

## 2021-06-29 DIAGNOSIS — C649 Malignant neoplasm of unspecified kidney, except renal pelvis: Secondary | ICD-10-CM | POA: Diagnosis not present

## 2021-06-29 DIAGNOSIS — L309 Dermatitis, unspecified: Secondary | ICD-10-CM | POA: Insufficient documentation

## 2021-06-29 DIAGNOSIS — Z79899 Other long term (current) drug therapy: Secondary | ICD-10-CM | POA: Insufficient documentation

## 2021-06-29 DIAGNOSIS — C7951 Secondary malignant neoplasm of bone: Secondary | ICD-10-CM | POA: Insufficient documentation

## 2021-06-29 DIAGNOSIS — C642 Malignant neoplasm of left kidney, except renal pelvis: Secondary | ICD-10-CM | POA: Insufficient documentation

## 2021-06-29 LAB — CBC WITH DIFFERENTIAL (CANCER CENTER ONLY)
Abs Immature Granulocytes: 0.02 10*3/uL (ref 0.00–0.07)
Basophils Absolute: 0.1 10*3/uL (ref 0.0–0.1)
Basophils Relative: 1 %
Eosinophils Absolute: 0.2 10*3/uL (ref 0.0–0.5)
Eosinophils Relative: 4 %
HCT: 36.8 % — ABNORMAL LOW (ref 39.0–52.0)
Hemoglobin: 12.3 g/dL — ABNORMAL LOW (ref 13.0–17.0)
Immature Granulocytes: 0 %
Lymphocytes Relative: 19 %
Lymphs Abs: 1.2 10*3/uL (ref 0.7–4.0)
MCH: 29.4 pg (ref 26.0–34.0)
MCHC: 33.4 g/dL (ref 30.0–36.0)
MCV: 88 fL (ref 80.0–100.0)
Monocytes Absolute: 0.9 10*3/uL (ref 0.1–1.0)
Monocytes Relative: 15 %
Neutro Abs: 3.7 10*3/uL (ref 1.7–7.7)
Neutrophils Relative %: 61 %
Platelet Count: 303 10*3/uL (ref 150–400)
RBC: 4.18 MIL/uL — ABNORMAL LOW (ref 4.22–5.81)
RDW: 11.8 % (ref 11.5–15.5)
WBC Count: 6 10*3/uL (ref 4.0–10.5)
nRBC: 0 % (ref 0.0–0.2)

## 2021-06-29 LAB — CMP (CANCER CENTER ONLY)
ALT: 45 U/L — ABNORMAL HIGH (ref 0–44)
AST: 31 U/L (ref 15–41)
Albumin: 3.7 g/dL (ref 3.5–5.0)
Alkaline Phosphatase: 125 U/L (ref 38–126)
Anion gap: 7 (ref 5–15)
BUN: 20 mg/dL (ref 6–20)
CO2: 28 mmol/L (ref 22–32)
Calcium: 10 mg/dL (ref 8.9–10.3)
Chloride: 104 mmol/L (ref 98–111)
Creatinine: 0.56 mg/dL — ABNORMAL LOW (ref 0.61–1.24)
GFR, Estimated: >60 mL/min (ref >60)
Glucose, Bld: 120 mg/dL — ABNORMAL HIGH (ref 70–99)
Potassium: 3.7 mmol/L (ref 3.5–5.1)
Sodium: 139 mmol/L (ref 135–145)
Total Bilirubin: 0.5 mg/dL (ref 0.3–1.2)
Total Protein: 7.1 g/dL (ref 6.5–8.1)

## 2021-06-29 LAB — TSH: TSH: <0.08 u[IU]/mL — ABNORMAL LOW (ref 0.320–4.118)

## 2021-06-29 MED ORDER — SODIUM CHLORIDE 0.9 % IV SOLN
1.0000 mg/kg | Freq: Once | INTRAVENOUS | Status: AC
  Administered 2021-06-29: 80 mg via INTRAVENOUS
  Filled 2021-06-29: qty 16

## 2021-06-29 MED ORDER — TRIAMCINOLONE ACETONIDE 0.1 % EX CREA: 1.0000 "application " | TOPICAL_CREAM | Freq: Two times a day (BID) | CUTANEOUS | 1 refills | Status: DC

## 2021-06-29 MED ORDER — DIPHENHYDRAMINE HCL 50 MG/ML IJ SOLN
25.0000 mg | Freq: Once | INTRAMUSCULAR | Status: AC
  Administered 2021-06-29: 25 mg via INTRAVENOUS
  Filled 2021-06-29: qty 1

## 2021-06-29 MED ORDER — FAMOTIDINE IN NACL 20-0.9 MG/50ML-% IV SOLN
20.0000 mg | Freq: Once | INTRAVENOUS | Status: AC
  Administered 2021-06-29: 20 mg via INTRAVENOUS
  Filled 2021-06-29: qty 50

## 2021-06-29 MED ORDER — HYDROXYZINE HCL 10 MG PO TABS: 10.0000 mg | ORAL_TABLET | Freq: Three times a day (TID) | ORAL | 0 refills | Status: DC | PRN

## 2021-06-29 MED ORDER — SODIUM CHLORIDE 0.9 % IV SOLN: Freq: Once | INTRAVENOUS | Status: AC

## 2021-06-29 MED ORDER — SODIUM CHLORIDE 0.9 % IV SOLN
2.9400 mg/kg | Freq: Once | INTRAVENOUS | Status: AC
  Administered 2021-06-29: 240 mg via INTRAVENOUS
  Filled 2021-06-29: qty 24

## 2021-06-29 NOTE — Patient Instructions (Signed)
[facility]  Discharge Instructions: ?Thank you for choosing [facility] to provide your oncology and hematology care.  ? ?If you have a lab appointment with the [facility], please go directly to the [facility] and check in at the registration area. ?  ?Wear comfortable clothing and clothing appropriate for easy access to any Portacath or PICC line.  ? ?We strive to give you quality time with your provider. You may need to reschedule your appointment if you arrive late (15 or more minutes).  Arriving late affects you and other patients whose appointments are after yours.  Also, if you miss three or more appointments without notifying the office, you may be dismissed from the clinic at the provider?s discretion.    ?  ?For prescription refill requests, have your pharmacy contact our office and allow 72 hours for refills to be completed.   ? ?Today you received the following chemotherapy and/or immunotherapy agents: Yervoy and Opdivo   ?  ?To help prevent nausea and vomiting after your treatment, we encourage you to take your nausea medication as directed. ? ?BELOW ARE SYMPTOMS THAT SHOULD BE REPORTED IMMEDIATELY: ?*FEVER GREATER THAN 100.4 F (38 ?C) OR HIGHER ?*CHILLS OR SWEATING ?*NAUSEA AND VOMITING THAT IS NOT CONTROLLED WITH YOUR NAUSEA MEDICATION ?*UNUSUAL SHORTNESS OF BREATH ?*UNUSUAL BRUISING OR BLEEDING ?*URINARY PROBLEMS (pain or burning when urinating, or frequent urination) ?*BOWEL PROBLEMS (unusual diarrhea, constipation, pain near the anus) ?TENDERNESS IN MOUTH AND THROAT WITH OR WITHOUT PRESENCE OF ULCERS (sore throat, sores in mouth, or a toothache) ?UNUSUAL RASH, SWELLING OR PAIN  ?UNUSUAL VAGINAL DISCHARGE OR ITCHING  ? ?Items with * indicate a potential emergency and should be followed up as soon as possible or go to the Emergency Department if any problems should occur. ? ?Please show the CHEMOTHERAPY ALERT CARD or IMMUNOTHERAPY ALERT CARD at  check-in to the Emergency Department and triage nurse. ? ?Should you have questions after your visit or need to cancel or reschedule your appointment, please contact [facility]  Dept: [phone]  and follow the prompts.  Office hours are 8:00 a.m. to 4:30 p.m. Monday - Friday. Please note that voicemails left after 4:00 p.m. may not be returned until the following business day.  We are closed weekends and major holidays. You have access to a nurse at all times for urgent questions. Please call the main number to the clinic Dept: [phone] and follow the prompts. ? ? ?For any non-urgent questions, you may also contact your provider using MyChart. We now offer e-Visits for anyone 12 and older to request care online for non-urgent symptoms. For details visit [email or web address]. ?  ?Also download the MyChart app! Go to the app store, search "MyChart", open the app, select [facility], and log in with your MyChart username and password. ? ?Due to Covid, a mask is required upon entering the hospital/clinic. If you do not have a mask, one will be given to you upon arrival. For doctor visits, patients may have 1 support person aged 22 or older with them. For treatment visits, patients cannot have anyone with them due to current Covid guidelines and our immunocompromised population.  ? ?

## 2021-06-29 NOTE — Progress Notes (Signed)
Ipilimumab (YERVOY) Patient Monitoring Assessment  ? ?Is the patient experiencing any of the following general symptoms?:  ?'[x]'$ Patient is not experiencing any of the general symptoms listed in this section.  ?'[]'$ Difficulty performing normal activities ?'[]'$ Feeling sluggish or cold all the time ?'[]'$ Unusual weight gain ?'[]'$ Constant or unusual headaches ?'[]'$ Feeling dizzy or faint ?'[]'$ Changes in eyesight (blurry vision, double vision, or other vision problems) ?'[]'$ Changes in mood or behavior (ex: decreased sex drive, irritability, or forgetfulness) ?'[]'$ Starting new medications (ex: steroids, other medications that lower immune response) ? ? ?Gastrointestinal  ?Patient is having 2 bowel movements each day.  ?Is this different from baseline? '[x]'$ Yes '[x]'$ No ?Are your stools watery or do they have a foul smell? '[]'$ Yes '[x]'$ No ?Have you seen blood in your stools? '[]'$ Yes '[x]'$ No ?Are your stools dark, tarry, or sticky? '[]'$ Yes '[x]'$ No ?Are you having pain or tenderness in your belly? '[]'$ Yes '[x]'$ No ? ?Skin ?Does your skin itch? '[x]'$ Yes '[]'$ No ?Do you have a rash? '[]'$ Yes '[x]'$ No ?Has your skin blistered and/or peeled? '[]'$ Yes '[x]'$ No ?Do you have sores in your mouth? '[]'$ Yes '[x]'$ No ? ?Hepatic ?Has your urine been dark or tea colored? '[]'$ Yes '[x]'$ No ?Have you noticed your skin or the whites of your eyes are turning yellow? '[]'$ Yes '[x]'$ No ?Are you bleeding or bruising more easily than normal? '[]'$ Yes '[x]'$ No ?Are you nauseous and/or vomiting? '[]'$ Yes '[x]'$ No ?Do you have pain on the right side of your stomach? '[]'$ Yes '[x]'$ No ? ?Neurologic  ?Are you having unusual weakness of legs, arms, or face? '[]'$ Yes '[x]'$ No ?Are you having numbness or tingling in your hands or feet? '[]'$ Yes '[x]'$ No ? ?Tildon Husky ? ?

## 2021-06-29 NOTE — Progress Notes (Signed)
Hematology and Oncology Follow Up Visit ? ?Gordon Mcclain ?[id number] ?04-Mar-1976 45 y.o. ?06/29/2021 8:06 AM ?Gordon Mcclain, DOMcGowen, Gordon Blackwater, MD  ? ?Principle Diagnosis: 46 year old man with kidney cancer diagnosed in January 2023.  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 ? ? ?Prior Therapy: ? ?He is status post left hip surgery completed in March 2023.  He underwent left hemiarthroplasty at that time. ? ?Palliative radiation therapy to the left ribs with total of 30 Gray in 10 fractions completed in February 2023. ? ?Current therapy: ? ?Ipilimumab 1 mg/kg and nivolumab 3 mg/kg started on May 18, 2021.  He is here for cycle 3 of therapy. ? ?Interim History: Gordon Mcclain is here for repeat evaluation.  Since the last visit, he reports no major changes in his health.  He continues to have pruritus and very mild skin rash on his trunk and back.  He denies any nausea, vomiting or abdominal pain.  He denies any worsening diarrhea.  He is eating well but lost more weight.  He is ambulating much better with the help of a cane. ? ? ? ? ?Medications: Updated on review. ?Current Outpatient Medications  ?Medication Sig Dispense Refill  ? acetaminophen (TYLENOL) 325 MG tablet Take 650 mg by mouth every 6 (six) hours as needed.    ? cyclobenzaprine (FLEXERIL) 10 MG tablet Take by mouth. (Patient not taking: Reported on 12/01/2020)    ? diphenoxylate-atropine (LOMOTIL) 2.5-0.025 MG tablet Take 1 tablet by mouth 4 (four) times daily as needed for diarrhea or loose stools. 30 tablet 0  ? lidocaine (LIDODERM) 5 % Place 1 patch onto the skin daily. Remove & Discard patch within 12 hours or as directed by MD 30 patch 0  ? prochlorperazine (COMPAZINE) 10 MG tablet Take 1 tablet (10 mg total) by mouth every 6 (six) hours as needed for nausea or vomiting. 30 tablet 0  ? traMADol (ULTRAM) 50 MG tablet Take 1 tablet (50 mg total) by mouth  every 6 (six) hours as needed for moderate pain. 30 tablet 0  ? triamcinolone ointment (KENALOG) 0.5 % Apply 1 application. topically 2 (two) times daily. 30 g 0  ? ?No current facility-administered medications for this visit.  ? ? ? ?Allergies: No Known Allergies ? ? ?Physical Exam: ?Blood pressure 116/76, pulse 98, temperature 97.9 ?F (36.6 ?C), temperature source Temporal, resp. rate 16, height '5\' 5"'$  (1.651 m), weight 171 lb 14.4 oz (78 kg), SpO2 99 %. ? ?ECOG: 1 ? ? ?General appearance: Alert, awake without any distress. ?Head: Atraumatic without abnormalities ?Oropharynx: Without any thrush or ulcers. ?Eyes: No scleral icterus. ?Lymph nodes: No lymphadenopathy noted in the cervical, supraclavicular, or axillary nodes ?Heart:regular rate and rhythm, without any murmurs or gallops.   ?Lung: Clear to auscultation without any rhonchi, wheezes or dullness to percussion. ?Abdomin: Soft, nontender without any shifting dullness or ascites. ?Musculoskeletal: No clubbing or cyanosis. ?Neurological: No motor or sensory deficits. ?Skin: No rashes or lesions. ? ? ? ?Lab Results: ?Lab Results  ?Component Value Date  ? WBC 9.0 06/08/2021  ? HGB 12.5 (L) 06/08/2021  ? HCT 37.4 (L) 06/08/2021  ? MCV 91.0 06/08/2021  ? PLT 525 (H) 06/08/2021  ? ?  Chemistry   ?   ?Component Value Date/Time  ? NA 139 06/08/2021 0803  ? K 3.6 06/08/2021 0803  ? CL 102 06/08/2021 0803  ? CO2 29 06/08/2021 0803  ?  BUN 23 (H) 06/08/2021 0803  ? CREATININE 0.67 06/08/2021 0803  ?    ?Component Value Date/Time  ? CALCIUM 9.9 06/08/2021 0803  ? ALKPHOS 91 06/08/2021 0803  ? AST 20 06/08/2021 0803  ? ALT 40 06/08/2021 0803  ? BILITOT 0.8 06/08/2021 0803  ?  ? ? ? ? ? ? ?Impression and Plan: ? ? ?46 year old with: ? ?1.  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  ? ? ?The natural course of his disease was reviewed at this time.  Treatment choices were reiterated.  He is currently  receiving immunotherapy and has tolerated it well.  The plan is to complete 4 cycles of therapy before updating his staging scans.  He is agreeable to proceed. ? ? ?2.  IV access: No complications related to peripheral veins. ?  ?3.  Antiemetics: Compazine is available without any nausea or vomiting. ? ?4.  Left hip metastasis: Continues to improve at this time after left hemiarthroplasty. ?  ?5.  Seventh rib metastasis: He is status post radiation therapy with improvement in his pain.  ?  ?6.  Immunotherapy surveillance: Complications such as pneumonitis, colitis, hepatitis and hypophysitis were reiterated.  He is experiencing dermatitis and pruritus and will treat with topical creams and hydroxyzine. ?  ?7.  CNS screening: We will arrange for MRI of the brain once he is scheduled for staging scans in the future. ? ?8.  Prognosis and goals of care: Aggressive measures are warranted given his young age and limited metastatic disease.  This prognosis will be determined by how he responds to systemic therapy.  This was discussed today in detail. ? ?9.  Follow-up: He will return in 3 weeks for the next cycle of therapy. ?  ?  ?30  minutes were spent on this visit.  The time was dedicated to reviewing laboratory data, disease status update and outlining future plan of care review. ? ? ?Gordon Button, MD ?4/5/20238:06 AM ? ?

## 2021-07-15 ENCOUNTER — Telehealth: Payer: Self-pay

## 2021-07-15 ENCOUNTER — Other Ambulatory Visit: Payer: Self-pay | Admitting: Oncology

## 2021-07-15 ENCOUNTER — Encounter: Payer: Self-pay | Admitting: Oncology

## 2021-07-15 DIAGNOSIS — C649 Malignant neoplasm of unspecified kidney, except renal pelvis: Secondary | ICD-10-CM

## 2021-07-15 NOTE — Telephone Encounter (Signed)
Spouse Kallin Henk called and states that "Dr. Warren Lacy w/ [facility] has approved this patient to move forward w/ radiation TX to LT hip. They would like to schedule a consultation appointment prior to May 9th 2023. Patient will be having kidney surgery on that date." I assured the patient that I would relay this information to his care team here at [facility]. ?

## 2021-07-19 ENCOUNTER — Other Ambulatory Visit: Payer: Self-pay

## 2021-07-19 ENCOUNTER — Telehealth: Payer: Self-pay | Admitting: Oncology

## 2021-07-19 ENCOUNTER — Ambulatory Visit
Admission: RE | Admit: 2021-07-19 | Discharge: 2021-07-19 | Disposition: A | Discharge status: Home / Self Care | Payer: 59 | Source: Ambulatory Visit | Attending: Radiation Oncology | Admitting: Radiation Oncology

## 2021-07-19 DIAGNOSIS — Z5112 Encounter for antineoplastic immunotherapy: Secondary | ICD-10-CM | POA: Diagnosis not present

## 2021-07-19 DIAGNOSIS — C649 Malignant neoplasm of unspecified kidney, except renal pelvis: Secondary | ICD-10-CM

## 2021-07-19 NOTE — Progress Notes (Signed)
?  Radiation Oncology         [phone] ?________________________________ ? ?Name: Gordon Mcclain MRN: [id number]  ?Date: 07/19/2021  DOB: January 22, 1976 ? ?SIMULATION AND TREATMENT PLANNING NOTE ? ?  ICD-10-CM   ?1. 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 C64.9   ?  ? ? ?DIAGNOSIS:  46 yo man with newly diagnosed metastatic clear cell RCC s/p hemiarthroplasty of bony metastases in the left hip  ? ?NARRATIVE:  The patient was brought to the [facility].  Identity was confirmed.  All relevant records and images related to the planned course of therapy were reviewed.  The patient freely provided informed written consent to proceed with treatment after reviewing the details related to the planned course of therapy. The consent form was witnessed and verified by the simulation staff.  Then, the patient was set-up in a stable reproducible  supine position for radiation therapy.  CT images were obtained.  Surface markings were placed.  The CT images were loaded into the planning software.  Then the target and avoidance structures were contoured.  Treatment planning then occurred.  The radiation prescription was entered and confirmed.  Then, I designed and supervised the construction of a total of 3 medically necessary complex treatment devices consisting of leg positioner and MLC apertures to cover the treated hip area.  I have requested : 3D Simulation  I have requested a DVH of the following structures: Rectum, Bladder, femoral heads and target. ? ?PLAN:  The patient will receive 30 Gy in 10 fractions. ? ?________________________________ ? ?Sheral Apley Tammi Klippel, M.D. ? ?

## 2021-07-19 NOTE — Telephone Encounter (Signed)
Called patient regarding upcoming appointment, patient is notified. °

## 2021-07-20 ENCOUNTER — Ambulatory Visit
Admission: RE | Admit: 2021-07-20 | Discharge: 2021-07-20 | Disposition: A | Discharge status: Home / Self Care | Payer: 59 | Source: Ambulatory Visit | Attending: Radiation Oncology | Admitting: Radiation Oncology

## 2021-07-20 ENCOUNTER — Inpatient Hospital Stay: Payer: 59 | Admitting: Oncology

## 2021-07-20 ENCOUNTER — Other Ambulatory Visit: Payer: Self-pay | Admitting: Oncology

## 2021-07-20 ENCOUNTER — Inpatient Hospital Stay (HOSPITAL_BASED_OUTPATIENT_CLINIC_OR_DEPARTMENT_OTHER): Payer: 59 | Admitting: Physician Assistant

## 2021-07-20 ENCOUNTER — Other Ambulatory Visit: Payer: Self-pay

## 2021-07-20 ENCOUNTER — Inpatient Hospital Stay: Payer: 59

## 2021-07-20 VITALS — BP 122/75 | HR 80 | Temp 97.8°F | Resp 17 | Ht 65.0 in | Wt 174.5 lb

## 2021-07-20 DIAGNOSIS — C649 Malignant neoplasm of unspecified kidney, except renal pelvis: Secondary | ICD-10-CM

## 2021-07-20 DIAGNOSIS — C7951 Secondary malignant neoplasm of bone: Secondary | ICD-10-CM

## 2021-07-20 DIAGNOSIS — Z5112 Encounter for antineoplastic immunotherapy: Secondary | ICD-10-CM | POA: Diagnosis not present

## 2021-07-20 DIAGNOSIS — M549 Dorsalgia, unspecified: Secondary | ICD-10-CM

## 2021-07-20 LAB — CBC WITH DIFFERENTIAL (CANCER CENTER ONLY)
Abs Immature Granulocytes: 0.03 10*3/uL (ref 0.00–0.07)
Basophils Absolute: 0.1 10*3/uL (ref 0.0–0.1)
Basophils Relative: 1 %
Eosinophils Absolute: 0.4 10*3/uL (ref 0.0–0.5)
Eosinophils Relative: 7 %
HCT: 39.9 % (ref 39.0–52.0)
Hemoglobin: 13.2 g/dL (ref 13.0–17.0)
Immature Granulocytes: 1 %
Lymphocytes Relative: 23 %
Lymphs Abs: 1.3 10*3/uL (ref 0.7–4.0)
MCH: 28.9 pg (ref 26.0–34.0)
MCHC: 33.1 g/dL (ref 30.0–36.0)
MCV: 87.3 fL (ref 80.0–100.0)
Monocytes Absolute: 0.6 10*3/uL (ref 0.1–1.0)
Monocytes Relative: 10 %
Neutro Abs: 3.2 10*3/uL (ref 1.7–7.7)
Neutrophils Relative %: 58 %
Platelet Count: 303 10*3/uL (ref 150–400)
RBC: 4.57 MIL/uL (ref 4.22–5.81)
RDW: 12.1 % (ref 11.5–15.5)
WBC Count: 5.5 10*3/uL (ref 4.0–10.5)
nRBC: 0 % (ref 0.0–0.2)

## 2021-07-20 LAB — CMP (CANCER CENTER ONLY)
ALT: 35 U/L (ref 0–44)
AST: 25 U/L (ref 15–41)
Albumin: 3.9 g/dL (ref 3.5–5.0)
Alkaline Phosphatase: 107 U/L (ref 38–126)
Anion gap: 6 (ref 5–15)
BUN: 18 mg/dL (ref 6–20)
CO2: 27 mmol/L (ref 22–32)
Calcium: 9.8 mg/dL (ref 8.9–10.3)
Chloride: 106 mmol/L (ref 98–111)
Creatinine: 0.58 mg/dL — ABNORMAL LOW (ref 0.61–1.24)
GFR, Estimated: >60 mL/min (ref >60)
Glucose, Bld: 109 mg/dL — ABNORMAL HIGH (ref 70–99)
Potassium: 3.7 mmol/L (ref 3.5–5.1)
Sodium: 139 mmol/L (ref 135–145)
Total Bilirubin: 0.3 mg/dL (ref 0.3–1.2)
Total Protein: 6.8 g/dL (ref 6.5–8.1)

## 2021-07-20 LAB — RAD ONC ARIA SESSION SUMMARY
Course Elapsed Days: 0
Plan Fractions Treated to Date: 1
Plan Prescribed Dose Per Fraction: 3 Gy
Plan Total Fractions Prescribed: 10
Plan Total Prescribed Dose: 30 Gy
Reference Point Dosage Given to Date: 3 Gy
Reference Point Session Dosage Given: 3 Gy
Session Number: 1

## 2021-07-20 LAB — TSH: TSH: <0.01 u[IU]/mL — ABNORMAL LOW (ref 0.350–4.500)

## 2021-07-20 MED ORDER — SODIUM CHLORIDE 0.9 % IV SOLN: Freq: Once | INTRAVENOUS | Status: AC

## 2021-07-20 MED ORDER — DIPHENHYDRAMINE HCL 50 MG/ML IJ SOLN
25.0000 mg | Freq: Once | INTRAMUSCULAR | Status: AC
  Administered 2021-07-20: 25 mg via INTRAVENOUS
  Filled 2021-07-20: qty 1

## 2021-07-20 MED ORDER — SODIUM CHLORIDE 0.9 % IV SOLN
1.0000 mg/kg | Freq: Once | INTRAVENOUS | Status: AC
  Administered 2021-07-20: 80 mg via INTRAVENOUS
  Filled 2021-07-20: qty 16

## 2021-07-20 MED ORDER — SODIUM CHLORIDE 0.9 % IV SOLN
2.9400 mg/kg | Freq: Once | INTRAVENOUS | Status: AC
  Administered 2021-07-20: 240 mg via INTRAVENOUS
  Filled 2021-07-20: qty 24

## 2021-07-20 MED ORDER — FAMOTIDINE IN NACL 20-0.9 MG/50ML-% IV SOLN
20.0000 mg | Freq: Once | INTRAVENOUS | Status: AC
  Administered 2021-07-20: 20 mg via INTRAVENOUS
  Filled 2021-07-20: qty 50

## 2021-07-20 NOTE — Progress Notes (Signed)
Ipilimumab (YERVOY) Patient Monitoring Assessment  ? ?Is the patient experiencing any of the following general symptoms?:  ?'[]'$ Difficulty performing normal activities ?'[]'$ Feeling sluggish or cold all the time ?'[]'$ Unusual weight gain ?'[]'$ Constant or unusual headaches ?'[]'$ Feeling dizzy or faint ?'[]'$ Changes in eyesight (blurry vision, double vision, or other vision problems) ?'[]'$ Changes in mood or behavior (ex: decreased sex drive, irritability, or forgetfulness) ?'[]'$ Starting new medications (ex: steroids, other medications that lower immune response) ?'[x]'$ Patient is not experiencing any of the general symptoms above.  ? ? ?Gastrointestinal  ?Patient is having 2-3 bowel movements each day.  ?Is this different from baseline? '[]'$ Yes '[x]'$ No ?Are your stools watery or do they have a foul smell? '[]'$ Yes '[x]'$ No ?Have you seen blood in your stools? '[]'$ Yes '[x]'$ No ?Are your stools dark, tarry, or sticky? '[]'$ Yes '[x]'$ No ?Are you having pain or tenderness in your belly? '[]'$ Yes '[x]'$ No ? ?Skin ?Does your skin itch? '[]'$ Yes '[x]'$ No ?Do you have a rash? '[]'$ Yes '[x]'$ No ?Has your skin blistered and/or peeled? '[]'$ Yes '[x]'$ No ?Do you have sores in your mouth? '[]'$ Yes '[x]'$ No ? ?Hepatic ?Has your urine been dark or tea colored? '[]'$ Yes '[x]'$ No ?Have you noticed that your skin or the whites of your eyes are turning yellow? '[]'$ Yes '[x]'$ No ?Are you bleeding or bruising more easily than normal? '[]'$ Yes '[x]'$ No ?Are you nauseous and/or vomiting? '[]'$ Yes '[x]'$ No ?Do you have pain on the right side of your stomach? '[]'$ Yes '[x]'$ No ? ?Neurologic  ?Are you having unusual weakness of legs, arms, or face? '[]'$ Yes '[x]'$ No ?Are you having numbness or tingling in your hands or feet? '[]'$ Yes '[x]'$ No ? ?Severiano Gilbert  ?

## 2021-07-20 NOTE — Patient Instructions (Signed)
[facility]   Discharge Instructions: Thank you for choosing [facility] to provide your oncology and hematology care.   If you have a lab appointment with the Cancer Center, please go directly to the Cancer Center and check in at the registration area.   Wear comfortable clothing and clothing appropriate for easy access to any Portacath or PICC line.   We strive to give you quality time with your provider. You may need to reschedule your appointment if you arrive late (15 or more minutes).  Arriving late affects you and other patients whose appointments are after yours.  Also, if you miss three or more appointments without notifying the office, you may be dismissed from the clinic at the provider's discretion.      For prescription refill requests, have your pharmacy contact our office and allow 72 hours for refills to be completed.    Today you received the following chemotherapy and/or immunotherapy agents: Nivolumab (Opdivo) and Ipilimumab (Yervoy).      To help prevent nausea and vomiting after your treatment, we encourage you to take your nausea medication as directed.  BELOW ARE SYMPTOMS THAT SHOULD BE REPORTED IMMEDIATELY: *FEVER GREATER THAN 100.4 F (38 C) OR HIGHER *CHILLS OR SWEATING *NAUSEA AND VOMITING THAT IS NOT CONTROLLED WITH YOUR NAUSEA MEDICATION *UNUSUAL SHORTNESS OF BREATH *UNUSUAL BRUISING OR BLEEDING *URINARY PROBLEMS (pain or burning when urinating, or frequent urination) *BOWEL PROBLEMS (unusual diarrhea, constipation, pain near the anus) TENDERNESS IN MOUTH AND THROAT WITH OR WITHOUT PRESENCE OF ULCERS (sore throat, sores in mouth, or a toothache) UNUSUAL RASH, SWELLING OR PAIN  UNUSUAL VAGINAL DISCHARGE OR ITCHING   Items with * indicate a potential emergency and should be followed up as soon as possible or go to the Emergency Department if any problems should occur.  Please show the CHEMOTHERAPY ALERT CARD or  IMMUNOTHERAPY ALERT CARD at check-in to the Emergency Department and triage nurse.  Should you have questions after your visit or need to cancel or reschedule your appointment, please contact [facility]  Dept: [phone]  and follow the prompts.  Office hours are 8:00 a.m. to 4:30 p.m. Monday - Friday. Please note that voicemails left after 4:00 p.m. may not be returned until the following business day.  We are closed weekends and major holidays. You have access to a nurse at all times for urgent questions. Please call the main number to the clinic Dept: [phone] and follow the prompts.   For any non-urgent questions, you may also contact your provider using MyChart. We now offer e-Visits for anyone 18 and older to request care online for non-urgent symptoms. For details visit [email or web address].   Also download the MyChart app! Go to the app store, search "MyChart", open the app, select [facility], and log in with your MyChart username and password.  Due to Covid, a mask is required upon entering the hospital/clinic. If you do not have a mask, one will be given to you upon arrival. For doctor visits, patients may have 1 support person aged 18 or older with them. For treatment visits, patients cannot have anyone with them due to current Covid guidelines and our immunocompromised population.   

## 2021-07-20 NOTE — Progress Notes (Signed)
?  ?DATE:  07/20/21 ? ? ?                                     ?X CHEMO/IMMUNOTHERAPY REACTION         ?  ?MD: Dr. Alen Blew ?  ?AGENT/BLOOD PRODUCT RECEIVING TODAY:              Nivolumab and Ipilmumab ?  ?AGENT/BLOOD PRODUCT RECEIVING IMMEDIATELY PRIOR TO REACTION:          Nivolumab ?  ?VS: ?BP:     129/66   P:       77       SPO2:       100% RA                ? ?  ?REACTION(S):           back pain ?  ?PREMEDS:     benadryl 25 mg IV, pepcid 20 mg IV ?  ?INTERVENTION: IVF ?  ?Review of Systems  ?Review of Systems  ?Musculoskeletal:  Positive for arthralgias and back pain.  ?All other systems reviewed and are negative.  ? ?Physical Exam  ?Physical Exam ?Vitals and nursing note reviewed.  ?Constitutional:   ?   Appearance: He is well-developed. He is not ill-appearing or toxic-appearing.  ?HENT:  ?   Head: Normocephalic and atraumatic.  ?   Nose: Nose normal.  ?Eyes:  ?   General: No scleral icterus.    ?   Right eye: No discharge.     ?   Left eye: No discharge.  ?   Conjunctiva/sclera: Conjunctivae normal.  ?Neck:  ?   Vascular: No JVD.  ?Cardiovascular:  ?   Rate and Rhythm: Normal rate and regular rhythm.  ?   Pulses: Normal pulses.  ?   Heart sounds: Normal heart sounds.  ?Pulmonary:  ?   Effort: Pulmonary effort is normal.  ?   Breath sounds: Normal breath sounds.  ?Abdominal:  ?   General: There is no distension.  ?Musculoskeletal:     ?   General: Normal range of motion.  ?   Cervical back: Normal range of motion.  ?   Comments: No tenderness to palpation of cervical, thoracic, or lumbar spine. No step off or deformities. No overlying skin changes. Compartments soft in bilateral lower extremities.  ?Skin: ?   General: Skin is warm and dry.  ?   Capillary Refill: Capillary refill takes less than 2 seconds.  ?Neurological:  ?   Mental Status: He is oriented to person, place, and time.  ?   GCS: GCS eye subscore is 4. GCS verbal subscore is 5. GCS motor subscore is 6.  ?   Comments: Sensation grossly intact to  light touch in the lower extremities bilaterally. No saddle anesthesias. ?Strength 5/5 with flexion and extension at the bilateral hips, knees, and ankles. ?No noted gait deficit. ?Coordination intact with heel to shin testing. ? ?  ?Psychiatric:     ?   Behavior: Behavior normal.  ? ? ?OUTCOME:                Patient with bilateral lower back pani radiating down his right leg with burning pain 6/10 in severity. Pain stopped when treatment paused. Offered patient tylenol and discussed IV steroids however he does not feel that is necessary and decision made to not give additional  medications. RN added IVF and restarted Nivolumab. Patient tolerated remainder of treatment without difficulty. Oncologist Dr. Alen Blew made aware and agreeable with plan. ? ?

## 2021-07-20 NOTE — Progress Notes (Signed)
Hematology and Oncology Follow Up Visit ? ?Gordon Mcclain ?[id number] ?05/03/1975 45 y.o. ?07/20/2021 8:02 AM ?[name], DOMcGowen, Adrian Blackwater, MD  ? 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 ? ? ?Prior Therapy: ? ?He is status post left hip surgery completed in March 2023.  He underwent left hemiarthroplasty at that time. ? ?Palliative radiation therapy to the left ribs with total of 30 Gray in 10 fractions completed in February 2023. ? ?Current therapy: ? ?Ipilimumab 1 mg/kg and nivolumab 3 mg/kg started on May 18, 2021.  He is here for cycle 4 of therapy. ? ?Radiation therapy to the left hip currently ongoing.  The plan is to complete 30 Gray in 10 fractions. ? ?Interim History: Gordon Mcclain returns today for a follow-up visit.  Since last visit, he reports no major changes in his health.  He has tolerated immunotherapy well without any complications.  He denies any nausea, vomiting or abdominal pain.  He denies any skin rash or pruritus.  He denies any hospitalizations or illnesses.  His performance status and quality of life continues to improve.  He denies any bone pain or pathological fractures.  He rarely takes any pain medication except for Tylenol occasionally. ? ? ? ? ?Medications: Reviewed without changes. ?Current Outpatient Medications  ?Medication Sig Dispense Refill  ? acetaminophen (TYLENOL) 500 MG tablet Take 1,000 mg by mouth every 6 (six) hours as needed for moderate pain.    ? diphenoxylate-atropine (LOMOTIL) 2.5-0.025 MG tablet Take 1 tablet by mouth 4 (four) times daily as needed for diarrhea or loose stools. (Patient not taking: Reported on 07/18/2021) 30 tablet 0  ? hydrOXYzine (ATARAX) 10 MG tablet Take 1 tablet (10 mg total) by mouth 3 (three) times daily as needed. 30 tablet 0  ? lidocaine (LIDODERM) 5 % Place 1  patch onto the skin daily. Remove & Discard patch within 12 hours or as directed by MD (Patient not taking: Reported on 07/18/2021) 30 patch 0  ? prochlorperazine (COMPAZINE) 10 MG tablet Take 1 tablet (10 mg total) by mouth every 6 (six) hours as needed for nausea or vomiting. (Patient not taking: Reported on 07/18/2021) 30 tablet 0  ? traMADol (ULTRAM) 50 MG tablet Take 1 tablet (50 mg total) by mouth every 6 (six) hours as needed for moderate pain. (Patient not taking: Reported on 07/18/2021) 30 tablet 0  ? triamcinolone cream (KENALOG) 0.1 % Apply 1 application. topically 2 (two) times daily. (Patient taking differently: Apply 1 application. topically daily as needed (itching).) 453.6 g 1  ? ?No current facility-administered medications for this visit.  ? ? ? ?Allergies: No Known Allergies ? ? ?Physical Exam: ?Blood pressure 122/75, pulse 80, temperature 97.8 ?F (36.6 ?C), temperature source Temporal, resp. rate 17, height '5\' 5"'$  (1.651 m), weight 174 lb 8 oz (79.2 kg), SpO2 100 %. ? ? ?ECOG: 1 ? ? ?General appearance: Comfortable appearing without any discomfort ?Head: Normocephalic without any trauma ?Oropharynx: Mucous membranes are moist and pink without any thrush or ulcers. ?Eyes: Pupils are equal and round reactive to light. ?Lymph nodes: No cervical, supraclavicular, inguinal or axillary lymphadenopathy.   ?Heart:regular rate and rhythm.  S1 and S2 without leg edema. ?Lung: Clear without any rhonchi or wheezes.  No dullness to percussion. ?Abdomin: Soft, nontender, nondistended with good bowel sounds.  No hepatosplenomegaly. ?Musculoskeletal: No joint deformity or  effusion.  Full range of motion noted. ?Neurological: No deficits noted on motor, sensory and deep tendon reflex exam. ?Skin: No petechial rash or dryness.  Appeared moist.  ? ? ? ?Lab Results: ?Lab Results  ?Component Value Date  ? WBC 6.0 06/29/2021  ? HGB 12.3 (L) 06/29/2021  ? HCT 36.8 (L) 06/29/2021  ? MCV 88.0 06/29/2021  ? PLT 303 06/29/2021   ? ?  Chemistry   ?   ?Component Value Date/Time  ? NA 139 06/29/2021 0800  ? K 3.7 06/29/2021 0800  ? CL 104 06/29/2021 0800  ? CO2 28 06/29/2021 0800  ? BUN 20 06/29/2021 0800  ? CREATININE 0.56 (L) 06/29/2021 0800  ?    ?Component Value Date/Time  ? CALCIUM 10.0 06/29/2021 0800  ? ALKPHOS 125 06/29/2021 0800  ? AST 31 06/29/2021 0800  ? ALT 45 (H) 06/29/2021 0800  ? BILITOT 0.5 06/29/2021 0800  ?  ? ? ? ? ? ? ?Impression and Plan: ? ? ?46 year old with: ? ?1.  Kidney cancer diagnosed in January 2023.  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  ? ? ?Risks and benefits of proceeding with cycle 4 of ipilimumab and nivolumab were discussed today.  Complications include immune mediated issues, GI toxicities among others were reiterated.  Plan is to complete 4 cycles of therapy, update his staging scans before proceeding with a salvage nephrectomy.  Maintenance nivolumab will be considered after his surgical resection.  He is agreeable to proceed. ? ? ?2.  IV access: He continues to proceed with peripheral veins without any complications. ?  ?3.  Antiemetics: No nausea or vomiting reported at this time.  Compazine is available to him. ? ?4.  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  He is receiving additional radiation therapy. ?  ?5.  Seventh rib metastasis: No increased pain noted at this time.  He completed radiation therapy. ?  ?6.  Immunotherapy surveillance: I continue to educate him about these complications including hepatitis, pneumonitis, hypophysitis among others. ?  ?7.  CNS screening: We will obtain an MRI of the brain to complete his staging. ? ?8.  Prognosis and goals of care: Therapy remains palliative at this time as aggressive measures are warranted given his excellent performance status. ? ?9.  Follow-up: He will return in 4 weeks for repeat follow-up. ?  ?  ?30  minutes were dedicated to this  encounter.  The time was spent on reviewing laboratory data, disease status update and updating treatment choices for the future. ? ? ?Zola Button, MD ?4/26/20238:02 AM ? ?

## 2021-07-20 NOTE — Progress Notes (Signed)
Hypersensitivity Reaction note ? ?Date of event: 07/20/21 ?Time of event: 10:10 AM ?Generic name of drug involved: Nivolumab (Opdivo)  ?Name of provider notified of the hypersensitivity reaction: Dr.Shadad, MD and Milinda Cave, PA-C ?Was agent that likely caused hypersensitivity reaction added to Allergies List within EMR? Yes ?Chain of events including reaction signs/symptoms, treatment administered, and outcome (e.g., drug resumed; drug discontinued; sent to Emergency Department; etc.): ? ?1010: Patient noted increased lower back pain with radiation to right leg. Pain rated as a 6/10 and acute. Dr. Alen Blew and Sonda Rumble notified. Opdivo infusion paused. ?1011: Vitals remained stable.   ?1015: Patient noted resolution of back pain with pausing of Opdivo infusion. MD confirmed to leave infusion paused until patient assessed by PA-C. ?1020: Patient assessed at chairside by Sonda Rumble. ?1030: Received okay to restart treatment with additional fluids run concurrently with infusion. ?Patient completed rest of infusion with no issues. Patient stable upon discharge.  ? ? ?Severiano Gilbert, RN ?07/20/2021 11:47 AM ? ?

## 2021-07-21 ENCOUNTER — Other Ambulatory Visit: Payer: Self-pay

## 2021-07-21 ENCOUNTER — Ambulatory Visit
Admission: RE | Admit: 2021-07-21 | Discharge: 2021-07-21 | Disposition: A | Discharge status: Home / Self Care | Payer: 59 | Source: Ambulatory Visit | Attending: Radiation Oncology | Admitting: Radiation Oncology

## 2021-07-21 DIAGNOSIS — Z5112 Encounter for antineoplastic immunotherapy: Secondary | ICD-10-CM | POA: Diagnosis not present

## 2021-07-21 LAB — RAD ONC ARIA SESSION SUMMARY
Course Elapsed Days: 1
Course Elapsed Days: 1
Plan Fractions Treated to Date: 2
Plan Fractions Treated to Date: 3
Plan Prescribed Dose Per Fraction: 3 Gy
Plan Prescribed Dose Per Fraction: 3 Gy
Plan Total Fractions Prescribed: 10
Plan Total Fractions Prescribed: 10
Plan Total Prescribed Dose: 30 Gy
Plan Total Prescribed Dose: 30 Gy
Reference Point Dosage Given to Date: 6 Gy
Reference Point Dosage Given to Date: 9 Gy
Reference Point Session Dosage Given: 3 Gy
Reference Point Session Dosage Given: 3 Gy
Session Number: 2
Session Number: 3

## 2021-07-21 NOTE — Progress Notes (Addendum)
COVID Vaccine Completed: no ?Date COVID Vaccine completed: ?Has received booster: ?COVID vaccine manufacturer: [employer]  ? ?Date of COVID positive in last 90 days: no ? ?PCP - Gordon Nutting, DO ?Cardiologist - n/a ? ?Chest x-ray - 05/04/21 Epic ?EKG - 05/06/21 Epic ?Stress Test - n/a ?ECHO - n/a ?Cardiac Cath - n/a ?Pacemaker/ICD device last checked: n/a ?Spinal Cord Stimulator: n/a ? ?Bowel Prep - clears day before ? ?Sleep Study - n/a ?CPAP -  ? ?Fasting Blood Sugar - n/a ?Checks Blood Sugar _____ times a day ? ?Blood Thinner Instructions: n/a ?Aspirin Instructions: ?Last Dose: ? ?Activity level: Can go up a flight of stairs and perform activities of daily living without stopping and without symptoms of chest pain or shortness of breath. ?     ? ?Anesthesia review: palpitations, renal cancer, radiation x2 day and immunotherapy last one week before surgery ? ?Patient denies shortness of breath, fever, cough and chest pain at PAT appointment ? ? ?Patient verbalized understanding of instructions that were given to them at the PAT appointment. Patient was also instructed that they will need to review over the PAT instructions again at home before surgery.  ?

## 2021-07-21 NOTE — Patient Instructions (Addendum)
DUE TO COVID-19 ONLY TWO VISITORS  (aged 47 and older)  ARE ALLOWED TO COME WITH YOU AND STAY IN THE WAITING ROOM ONLY DURING PRE OP AND PROCEDURE.   ?**NO VISITORS ARE ALLOWED IN THE SHORT STAY AREA OR RECOVERY ROOM!!** ? ?IF YOU WILL BE ADMITTED INTO THE HOSPITAL YOU ARE ALLOWED ONLY FOUR SUPPORT PEOPLE DURING VISITATION HOURS ONLY (7 AM -8PM)   ?The support person(s) must pass our screening, gel in and out, and wear a mask at all times, including in the patient?s room. ?Patients must also wear a mask when staff or their support person are in the room. ?Visitors GUEST BADGE MUST BE WORN VISIBLY  ?One adult visitor may remain with you overnight and MUST be in the room by 8 P.M. ?  ? ? Your procedure is scheduled on: 08/02/21 ? ? Report to [facility] Main Entrance ? ?  Report to admitting at 5:15 AM ? ? Call this number if you have problems the morning of surgery [phone] ? ? Follow clear liquid diet the day before surgery ? ? You may have the following liquids until 4:15 AM DAY OF SURGERY ? ?Water ?Black Coffee (sugar ok, NO MILK/CREAM OR CREAMERS)  ?Tea (sugar ok, NO MILK/CREAM OR CREAMERS) regular and decaf                             ?Plain Jell-O (NO RED)                                           ?Fruit ices (not with fruit pulp, NO RED)                                     ?Popsicles (NO RED)                                                                  ?Juice: apple, WHITE grape, WHITE cranberry ?Sports drinks like Gatorade (NO RED) ?Clear broth(vegetable,chicken,beef) ? ?FOLLOW BOWEL PREP AND ANY ADDITIONAL PRE OP INSTRUCTIONS YOU RECEIVED FROM YOUR SURGEON'S OFFICE!!! ?  ?  ?Oral Hygiene is also important to reduce your risk of infection.                                    ?Remember - BRUSH YOUR TEETH THE MORNING OF SURGERY WITH YOUR REGULAR TOOTHPASTE ? ? Take these medicines the morning of surgery with A SIP OF WATER: Tylenol.  ?                  ?           You may not have any metal on  your body including jewelry, and body piercing ? ?           Do not wear lotions, powders, cologne, or deodorant ? ?            Men may shave face and neck. ? ?  Do not bring valuables to the hospital. [facility] NOT ?            RESPONSIBLE   FOR VALUABLES. ? ? Bring small overnight bag day of surgery. ? ?            Please read over the following fact sheets you were given: IF [place] [phone]- Apolonio Schneiders ? ?   [facility] - Preparing for Surgery ?Before surgery, you can play an important role.  Because skin is not sterile, your skin needs to be as free of germs as possible.  You can reduce the number of germs on your skin by washing with CHG (chlorahexidine gluconate) soap before surgery.  CHG is an antiseptic cleaner which kills germs and bonds with the skin to continue killing germs even after washing. ?Please DO NOT use if you have an allergy to CHG or antibacterial soaps.  If your skin becomes reddened/irritated stop using the CHG and inform your nurse when you arrive at Short Stay. ?Do not shave (including legs and underarms) for at least 48 hours prior to the first CHG shower.  You may shave your face/neck. ? ?Please follow these instructions carefully: ? 1.  Shower with CHG Soap the night before surgery and the  morning of surgery. ? 2.  If you choose to wash your hair, wash your hair first as usual with your normal  shampoo. ? 3.  After you shampoo, rinse your hair and body thoroughly to remove the shampoo.                            ? 4.  Use CHG as you would any other liquid soap.  You can apply chg directly to the skin and wash.  Gently with a scrungie or clean washcloth. ? 5.  Apply the CHG Soap to your body ONLY FROM THE NECK DOWN.   Do   not use on face/ open      ?                     Wound or open sores. Avoid contact with eyes, ears mouth and   genitals (private parts).  ?                     Production manager,  Genitals (private parts) with your  normal soap. ?            6.  Wash thoroughly, paying special attention to the area where your    surgery  will be performed. ? 7.  Thoroughly rinse your body with warm water from the neck down. ? 8.  DO NOT shower/wash with your normal soap after using and rinsing off the CHG Soap. ?               9.  Pat yourself dry with a clean towel. ?           10.  Wear clean pajamas. ?           11.  Place clean sheets on your bed the night of your first shower and do not  sleep with pets. ?Day of Surgery : ?Do not apply any lotions/deodorants the morning of surgery.  Please wear clean clothes to the hospital/surgery center. ? ?FAILURE TO FOLLOW THESE INSTRUCTIONS MAY RESULT IN THE CANCELLATION OF YOUR SURGERY ? ?PATIENT SIGNATURE_________________________________ ? ?  NURSE SIGNATURE__________________________________ ? ?________________________________________________________________________  ? ?Incentive Spirometer ? ?An incentive spirometer is a tool that can help keep your lungs clear and active. This tool measures how well you are filling your lungs with each breath. Taking long deep breaths may help reverse or decrease the chance of developing breathing (pulmonary) problems (especially infection) following: ?A long period of time when you are unable to move or be active. ?BEFORE THE PROCEDURE  ?If the spirometer includes an indicator to show your best effort, your nurse or respiratory therapist will set it to a desired goal. ?If possible, sit up straight or lean slightly forward. Try not to slouch. ?Hold the incentive spirometer in an upright position. ?INSTRUCTIONS FOR USE  ?Sit on the edge of your bed if possible, or sit up as far as you can in bed or on a chair. ?Hold the incentive spirometer in an upright position. ?Breathe out normally. ?Place the mouthpiece in your mouth and seal your lips tightly around it. ?Breathe in slowly and as deeply as possible, raising the piston or the ball toward the top of the  column. ?Hold your breath for 3-5 seconds or for as long as possible. Allow the piston or ball to fall to the bottom of the column. ?Remove the mouthpiece from your mouth and breathe out normally. ?Rest for a few seconds and repeat Steps 1 through 7 at least 10 times every 1-2 hours when you are awake. Take your time and take a few normal breaths between deep breaths. ?The spirometer may include an indicator to show your best effort. Use the indicator as a goal to work toward during each repetition. ?After each set of 10 deep breaths, practice coughing to be sure your lungs are clear. If you have an incision (the cut made at the time of surgery), support your incision when coughing by placing a pillow or rolled up towels firmly against it. ?Once you are able to get out of bed, walk around indoors and cough well. You may stop using the incentive spirometer when instructed by your caregiver.  ?RISKS AND COMPLICATIONS ?Take your time so you do not get dizzy or light-headed. ?If you are in pain, you may need to take or ask for pain medication before doing incentive spirometry. It is harder to take a deep breath if you are having pain. ?AFTER USE ?Rest and breathe slowly and easily. ?It can be helpful to keep track of a log of your progress. Your caregiver can provide you with a simple table to help with this. ?If you are using the spirometer at home, follow these instructions: ?SEEK MEDICAL CARE IF:  ?You are having difficultly using the spirometer. ?You have trouble using the spirometer as often as instructed. ?Your pain medication is not giving enough relief while using the spirometer. ?You develop fever of 100.5? F (38.1? C) or higher. ?SEEK IMMEDIATE MEDICAL CARE IF:  ?You cough up bloody sputum that had not been present before. ?You develop fever of 102? F (38.9? C) or greater. ?You develop worsening pain at or near the incision site. ?MAKE SURE YOU:  ?Understand these instructions. ?Will watch your  condition. ?Will get help right away if you are not doing well or get worse. ?Document Released: 07/24/2006 Document Revised: 06/05/2011 Document Reviewed: 09/24/2006 ?ExitCare? Patient Information ?2014 [place], [place]. ? ? ?_____

## 2021-07-22 ENCOUNTER — Ambulatory Visit
Admission: RE | Admit: 2021-07-22 | Discharge: 2021-07-22 | Disposition: A | Discharge status: Home / Self Care | Payer: 59 | Source: Ambulatory Visit | Attending: Radiation Oncology | Admitting: Radiation Oncology

## 2021-07-22 ENCOUNTER — Encounter (HOSPITAL_COMMUNITY): Payer: Self-pay

## 2021-07-22 ENCOUNTER — Other Ambulatory Visit: Payer: Self-pay

## 2021-07-22 ENCOUNTER — Encounter (HOSPITAL_COMMUNITY)
Admission: RE | Admit: 2021-07-22 | Discharge: 2021-07-22 | Disposition: A | Discharge status: Home / Self Care | Payer: 59 | Source: Ambulatory Visit | Attending: Urology | Admitting: Urology

## 2021-07-22 DIAGNOSIS — Z01812 Encounter for preprocedural laboratory examination: Secondary | ICD-10-CM | POA: Insufficient documentation

## 2021-07-22 DIAGNOSIS — Z5112 Encounter for antineoplastic immunotherapy: Secondary | ICD-10-CM | POA: Diagnosis not present

## 2021-07-22 LAB — RAD ONC ARIA SESSION SUMMARY
Course Elapsed Days: 2
Plan Fractions Treated to Date: 4
Plan Prescribed Dose Per Fraction: 3 Gy
Plan Total Fractions Prescribed: 10
Plan Total Prescribed Dose: 30 Gy
Reference Point Dosage Given to Date: 12 Gy
Reference Point Session Dosage Given: 3 Gy
Session Number: 4

## 2021-07-22 LAB — TYPE AND SCREEN
ABO/RH(D): A POS
Antibody Screen: NEGATIVE

## 2021-07-25 ENCOUNTER — Other Ambulatory Visit: Payer: Self-pay

## 2021-07-25 ENCOUNTER — Other Ambulatory Visit: Payer: Self-pay | Admitting: *Deleted

## 2021-07-25 ENCOUNTER — Ambulatory Visit
Admission: RE | Admit: 2021-07-25 | Discharge: 2021-07-25 | Disposition: A | Discharge status: Home / Self Care | Payer: 59 | Source: Ambulatory Visit | Attending: Radiation Oncology | Admitting: Radiation Oncology

## 2021-07-25 DIAGNOSIS — C7951 Secondary malignant neoplasm of bone: Secondary | ICD-10-CM | POA: Insufficient documentation

## 2021-07-25 DIAGNOSIS — C642 Malignant neoplasm of left kidney, except renal pelvis: Secondary | ICD-10-CM | POA: Diagnosis present

## 2021-07-25 DIAGNOSIS — Z510 Encounter for antineoplastic radiation therapy: Secondary | ICD-10-CM | POA: Diagnosis not present

## 2021-07-25 LAB — RAD ONC ARIA SESSION SUMMARY
Course Elapsed Days: 5
Plan Fractions Treated to Date: 5
Plan Prescribed Dose Per Fraction: 3 Gy
Plan Total Fractions Prescribed: 10
Plan Total Prescribed Dose: 30 Gy
Reference Point Dosage Given to Date: 15 Gy
Reference Point Session Dosage Given: 3 Gy
Session Number: 5

## 2021-07-26 ENCOUNTER — Telehealth: Payer: Self-pay | Admitting: Oncology

## 2021-07-26 ENCOUNTER — Ambulatory Visit
Admission: RE | Admit: 2021-07-26 | Discharge: 2021-07-26 | Disposition: A | Discharge status: Home / Self Care | Payer: 59 | Source: Ambulatory Visit | Attending: Radiation Oncology | Admitting: Radiation Oncology

## 2021-07-26 ENCOUNTER — Ambulatory Visit (HOSPITAL_COMMUNITY)
Admission: RE | Admit: 2021-07-26 | Discharge: 2021-07-26 | Disposition: A | Discharge status: Home / Self Care | Payer: 59 | Source: Ambulatory Visit | Attending: Oncology | Admitting: Oncology

## 2021-07-26 ENCOUNTER — Other Ambulatory Visit: Payer: Self-pay

## 2021-07-26 DIAGNOSIS — Z510 Encounter for antineoplastic radiation therapy: Secondary | ICD-10-CM | POA: Diagnosis not present

## 2021-07-26 DIAGNOSIS — C7951 Secondary malignant neoplasm of bone: Secondary | ICD-10-CM | POA: Diagnosis not present

## 2021-07-26 DIAGNOSIS — C649 Malignant neoplasm of unspecified kidney, except renal pelvis: Secondary | ICD-10-CM | POA: Diagnosis present

## 2021-07-26 LAB — RAD ONC ARIA SESSION SUMMARY
Course Elapsed Days: 6
Plan Fractions Treated to Date: 6
Plan Prescribed Dose Per Fraction: 3 Gy
Plan Total Fractions Prescribed: 10
Plan Total Prescribed Dose: 30 Gy
Reference Point Dosage Given to Date: 18 Gy
Reference Point Session Dosage Given: 3 Gy
Session Number: 6

## 2021-07-26 MED ORDER — SODIUM CHLORIDE (PF) 0.9 % IJ SOLN
INTRAMUSCULAR | Status: DC
Start: 2021-07-26 — End: 2021-07-26
  Filled 2021-07-26: qty 50

## 2021-07-26 MED ORDER — IOHEXOL 300 MG/ML  SOLN
100.0000 mL | Freq: Once | INTRAMUSCULAR | Status: AC | PRN
  Administered 2021-07-26: 100 mL via INTRAVENOUS

## 2021-07-26 NOTE — Telephone Encounter (Signed)
Called patient regarding upcoming appointments, patient is notified. 

## 2021-07-27 ENCOUNTER — Other Ambulatory Visit: Payer: Self-pay

## 2021-07-27 ENCOUNTER — Ambulatory Visit (HOSPITAL_COMMUNITY)
Admission: RE | Admit: 2021-07-27 | Discharge: 2021-07-27 | Disposition: A | Discharge status: Home / Self Care | Payer: 59 | Source: Ambulatory Visit | Attending: Oncology | Admitting: Oncology

## 2021-07-27 ENCOUNTER — Ambulatory Visit
Admission: RE | Admit: 2021-07-27 | Discharge: 2021-07-27 | Disposition: A | Discharge status: Home / Self Care | Payer: 59 | Source: Ambulatory Visit | Attending: Radiation Oncology | Admitting: Radiation Oncology

## 2021-07-27 DIAGNOSIS — Z510 Encounter for antineoplastic radiation therapy: Secondary | ICD-10-CM | POA: Diagnosis not present

## 2021-07-27 DIAGNOSIS — C649 Malignant neoplasm of unspecified kidney, except renal pelvis: Secondary | ICD-10-CM | POA: Diagnosis present

## 2021-07-27 DIAGNOSIS — C7951 Secondary malignant neoplasm of bone: Secondary | ICD-10-CM | POA: Diagnosis present

## 2021-07-27 LAB — RAD ONC ARIA SESSION SUMMARY
Course Elapsed Days: 7
Plan Fractions Treated to Date: 7
Plan Prescribed Dose Per Fraction: 3 Gy
Plan Total Fractions Prescribed: 10
Plan Total Prescribed Dose: 30 Gy
Reference Point Dosage Given to Date: 21 Gy
Reference Point Session Dosage Given: 3 Gy
Session Number: 7

## 2021-07-27 MED ORDER — GADOBUTROL 1 MMOL/ML IV SOLN
7.5000 mL | Freq: Once | INTRAVENOUS | Status: AC | PRN
  Administered 2021-07-27: 7.5 mL via INTRAVENOUS

## 2021-07-28 ENCOUNTER — Ambulatory Visit
Admission: RE | Admit: 2021-07-28 | Discharge: 2021-07-28 | Disposition: A | Discharge status: Home / Self Care | Payer: 59 | Source: Ambulatory Visit | Attending: Radiation Oncology | Admitting: Radiation Oncology

## 2021-07-28 ENCOUNTER — Other Ambulatory Visit: Payer: Self-pay

## 2021-07-28 ENCOUNTER — Telehealth: Payer: Self-pay | Admitting: *Deleted

## 2021-07-28 DIAGNOSIS — Z510 Encounter for antineoplastic radiation therapy: Secondary | ICD-10-CM | POA: Diagnosis not present

## 2021-07-28 LAB — RAD ONC ARIA SESSION SUMMARY
Course Elapsed Days: 8
Course Elapsed Days: 8
Plan Fractions Treated to Date: 8
Plan Fractions Treated to Date: 9
Plan Prescribed Dose Per Fraction: 3 Gy
Plan Prescribed Dose Per Fraction: 3 Gy
Plan Total Fractions Prescribed: 10
Plan Total Fractions Prescribed: 10
Plan Total Prescribed Dose: 30 Gy
Plan Total Prescribed Dose: 30 Gy
Reference Point Dosage Given to Date: 24 Gy
Reference Point Dosage Given to Date: 27 Gy
Reference Point Session Dosage Given: 3 Gy
Reference Point Session Dosage Given: 3 Gy
Session Number: 8
Session Number: 9

## 2021-07-28 NOTE — Telephone Encounter (Signed)
PC to patient, informed him of Dr Shadad's message below, he verbalizes understanding. 

## 2021-07-28 NOTE — Telephone Encounter (Signed)
-----   Message from Wyatt Portela, MD sent at 07/28/2021  8:27 AM EDT ----- ?Please let him know MRI is normal.  ?

## 2021-07-29 ENCOUNTER — Ambulatory Visit: Payer: 59

## 2021-07-29 ENCOUNTER — Other Ambulatory Visit: Payer: Self-pay

## 2021-07-29 ENCOUNTER — Ambulatory Visit
Admission: RE | Admit: 2021-07-29 | Discharge: 2021-07-29 | Disposition: A | Discharge status: Home / Self Care | Payer: 59 | Source: Ambulatory Visit | Attending: Radiation Oncology | Admitting: Radiation Oncology

## 2021-07-29 ENCOUNTER — Encounter: Payer: Self-pay | Admitting: Urology

## 2021-07-29 DIAGNOSIS — C649 Malignant neoplasm of unspecified kidney, except renal pelvis: Secondary | ICD-10-CM

## 2021-07-29 DIAGNOSIS — Z510 Encounter for antineoplastic radiation therapy: Secondary | ICD-10-CM | POA: Diagnosis not present

## 2021-07-29 LAB — RAD ONC ARIA SESSION SUMMARY
Course Elapsed Days: 9
Plan Fractions Treated to Date: 10
Plan Prescribed Dose Per Fraction: 3 Gy
Plan Total Fractions Prescribed: 10
Plan Total Prescribed Dose: 30 Gy
Reference Point Dosage Given to Date: 30 Gy
Reference Point Session Dosage Given: 3 Gy
Session Number: 10

## 2021-08-01 ENCOUNTER — Ambulatory Visit: Payer: 59

## 2021-08-01 NOTE — H&P (Signed)
PRE-OP H&P ? ?Office Visit Report     07/26/2021  ? ?-------------------------------------------------------------------------------- ?  ?Gordon Mcclain  ?MRN: [id number]  ?DOB: 04-Jan-1976, 46 year old Male  ?SSN:   ? PRIMARY CARE:  Shawnie Dapper, MD  ?REFERRING:  Glena Norfolk. Lovena Neighbours, MD  ?PROVIDER:  Ellison Hughs, M.D.  ?TREATING:  Daine Gravel, NP  ?LOCATION:  [facility] [phone]  ?  ? ?-------------------------------------------------------------------------------- ?  ?CC/HPI: Left renal mass  ? ?Gordon Mcclain is a 46 year old male referred by the cancer center after Gordon Mcclain was found to have a solid lesion involving the upper pole of the left kidney along with a likely metastatic lesion involving the left seventh rib and perirenal/perirenal metastatic adenopathy. The lesions were identified after a 72-monthhistory of ongoing left-sided rib pain, which prompted further diagnostic imaging. Gordon Mcclain has a CT abdomen and pelvis as well as a biopsy of the rib lesion pending. Preliminary concern is that this is metastatic RCC.  ? ?-No prior abdominal surgeries  ?-Denies flank pain or hematuria  ?-Previously smoked 1 ppd for the past 30 years-- down to 1-3 cigarettes/day  ? ?06/16/2021: The patient is here today for routine follow-up. Gordon Mcclain is currently being followed by Dr. SAlen Blewand receiving Ipilimumab for immunotherapy. Gordon Mcclain also received palliative radiation to his left rib lesions with Dr. MTammi Klippelin February along with left hip replacement. The tentative plan is for the patient to undergo a total of 4 cycles of immunotherapy and then repeat imaging to see if Gordon Mcclain is a candidate for a cytoreductive reductive nephrectomy. Gordon Mcclain is tolerating his immunotherapy well and denies any specific complaints today. Gordon Mcclain is urinating without difficulty and denies dysuria or hematuria  ? ?07/26/2021: 46year old man who is followed by Dr. SChriss Driverand Dr. WLovena Neighboursfor metastatic renal cell carcinoma. Currently receiving  immunotherapy with a goal of reducing the size for nephrectomy. Gordon Mcclain underwent CT imaging today results are listed below  ? ?left renal mass measures 7.0 x 6.0 cm and previously measured  ?7.9 x 7.2 cm. Demonstrates interval slight decrease in enhancement  ?and slight progression of necrotic change. Findings suggest a  ?response to treatment. The left renal vein is patent. The small  ?enhancing nodules located superior to the tumor and posterior to the  ?spleen are much improved. Some of the nodules have completely  ?resolved. The largest nodule previously measured 11 mm and now  ?measures 5 mm. The right kidney is unremarkable and stable. The  ?bladder is unremarkable.  ? ?Gordon Mcclain presents today for preoperatve appointment. Gordon Mcclain is doing well, Gordon Mcclain has no complaints. Gordon Mcclain denies fevers and chills. No changes to his medications.  ? ?  ?ALLERGIES: None  ? ?MEDICATIONS: Benadryl  ?Lidocaine Patches  ?Prochlorperazine  ?TRAMADOL HCL PO PRN  ?Tylenol  ?  ? ?GU PSH: None  ?   ?PSH Notes: cyst removed from lower back  ? ?NON-GU PSH: Dental Surgery Procedure ?Hip Replacement, Left ? ?  ? 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  ? ?NON-GU PMH: Metastatic lymphadenopathy of multiple regions - 06/16/2021, - 04/21/2021 ?  ? ?FAMILY HISTORY: 1 Daughter - Daughter ?1 son - Son ?Heart Disease - Runs in Family  ? ?SOCIAL HISTORY: Marital Status: Married ?Preferred Language: [place] Ethnicity: Not Hispanic Or Latino; Race: White ?Current Smoking Status: Patient smokes. Has smoked since 04/21/1991. Smokes less than 1/2 pack per day.  ? ?Tobacco Use Assessment Completed: Used Tobacco in last 30  days? ?Does not use smokeless tobacco. ?Has never drank.  ?Does not use drugs. ?Drinks 3 caffeinated drinks per day. ?Has not had a blood transfusion. ?Patient's occupation Economist. ?  ? ?REVIEW OF SYSTEMS:    ?GU Review Male:   Patient reports leakage of urine.  Patient denies frequent urination, hard to postpone urination, burning/ pain with urination, get up at night to urinate, stream starts and stops, trouble starting your stream, have to strain to urinate , erection problems, and penile pain.  ?Gastrointestinal (Upper):   Patient denies indigestion/ heartburn, vomiting, and nausea.  ?Gastrointestinal (Lower):   Patient denies diarrhea and constipation.  ?Constitutional:   Patient reports weight loss. Patient denies fever, night sweats, and fatigue.  ?Skin:   Patient denies skin rash/ lesion and itching.  ?Eyes:   Patient denies blurred vision and double vision.  ?Ears/ Nose/ Throat:   Patient denies sore throat and sinus problems.  ?Hematologic/Lymphatic:   Patient denies swollen glands and easy bruising.  ?Cardiovascular:   Patient denies leg swelling and chest pains.  ?Respiratory:   Patient denies cough and shortness of breath.  ?Endocrine:   Patient denies excessive thirst.  ?Musculoskeletal:   Patient reports back pain and joint pain.   ?Neurological:   Patient reports headaches. Patient denies dizziness.  ?Psychologic:   Patient denies depression and anxiety.  ? ?Notes: left side pain radiating up and down from hip ?  ? ?VITAL SIGNS:    ?  07/26/2021 10:03 AM  ?Weight 174 lb / 78.93 kg  ?Height 65 in / 165.1 cm  ?BP 133/83 mmHg  ?Pulse 96 /min  ?Temperature 97.5 F / 36.3 C  ?BMI 29.0 kg/m?  ? ?MULTI-SYSTEM PHYSICAL EXAMINATION:    ?Constitutional: Well-nourished. No physical deformities. Normally developed. Good grooming.  ?Respiratory: No labored breathing, no use of accessory muscles.   ?Cardiovascular: Normal temperature, normal extremity pulses, no swelling, no varicosities.  ?Skin: No paleness, no jaundice, no cyanosis. No lesion, no ulcer, no rash.  ?Neurologic / Psychiatric: Oriented to time, oriented to place, oriented to person. No depression, no anxiety, no agitation.  ?Gastrointestinal: No mass, no tenderness, no rigidity, non obese abdomen.  ?Eyes:  Normal conjunctivae. Normal eyelids.  ?Ears, Nose, Mouth, and Throat: Left ear no scars, no lesions, no masses. Right ear no scars, no lesions, no masses. Nose no scars, no lesions, no masses. Normal hearing. Normal lips.  ?Musculoskeletal: Normal gait and station of head and neck.  ? ?  ?Complexity of Data:  ?Source Of History:  Patient  ?Records Review:   Previous Doctor Records, Previous Patient Records  ?Urine Test Review:   Urinalysis  ?X-Ray Review: C.T. Abdomen/Pelvis: Reviewed Films. CLINICAL DATA: History of metastatic renal cell carcinoma. Assess  ?treatment response.  ? ?EXAM:  ?CT CHEST WITH CONTRAST  ? ?CT ABDOMEN AND PELVIS WITH AND WITHOUT CONTRAST  ? ?TECHNIQUE:  ?Multidetector CT imaging of the chest was performed during  ?intravenous contrast administration. Multidetector CT imaging of the  ?abdomen and pelvis was performed following the standard protocol  ?before and during bolus administration of intravenous contrast.  ? ?RADIATION DOSE REDUCTION: This exam was performed according to the  ?departmental dose-optimization program which includes automated  ?exposure control, adjustment of the mA and/or kV according to  ?patient size and/or use of iterative reconstruction technique.  ? ?CONTRAST: 127m OMNIPAQUE IOHEXOL 300 MG/ML SOLN  ? ?COMPARISON: Chest CT scan 05/04/2021. abdominal/pelvic CT scan  ?04/29/2021  ? ?FINDINGS:  ?CT CHEST FINDINGS  ? ?  Cardiovascular: The heart is normal in size. No pericardial  ?effusion. The aorta is normal in caliber. No dissection. No coronary  ?artery calcifications. The pulmonary arteries are grossly normal.  ? 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 No discrete lesion or mass. There is a  ?heterogeneously enhancing 2.7 x 2.3 cm nodule projecting off the  ?lower isthmus of the thyroid gland. Recommend thyroid US (ref: J Am  ?Coll Radiol. 2015 Feb;12(2): 143-50). The esophagus is unremarkable.  ? ?Lungs/Pleura: No  pulmonary nodules to suggest pulmonary metastatic  ?disease. No acute pulmonary findings. No pleural effusions or  ?pleural nodules.  ? ?Musculoskeletal: Further regression of the left seventh rib lesion.  ?Terie Purser

## 2021-08-01 NOTE — Anesthesia Preprocedure Evaluation (Addendum)
Anesthesia Evaluation  ?Patient identified by MRN, date of birth, ID band ?Patient awake ? ? ? ?Reviewed: ?Allergy & Precautions, NPO status , Patient's Chart, lab work & pertinent test results ? ?History of Anesthesia Complications ?Negative for: history of anesthetic complications ? ?Airway ?Mallampati: II ? ?TM Distance: >3 FB ?Neck ROM: Full ? ? ? Dental ? ?(+) Dental Advisory Given, Partial Lower, Missing ?  ?Pulmonary ?neg pulmonary ROS, former smoker,  ?  ?Pulmonary exam normal ? ? ? ? ? ? ? Cardiovascular ?negative cardio ROS ?Normal cardiovascular exam ? ? ?  ?Neuro/Psych ?negative neurological ROS ?   ? GI/Hepatic ?negative GI ROS, Neg liver ROS,   ?Endo/Other  ?negative endocrine ROS ? Renal/GU ?Renal disease (Metastatic renal cell carcinoma)  ?negative genitourinary ?  ?Musculoskeletal ?negative musculoskeletal ROS ?(+)  ? Abdominal ?  ?Peds ? Hematology ?negative hematology ROS ?(+)   ?Anesthesia Other Findings ? ? Reproductive/Obstetrics ? ?  ? ? ? ? ? ? ? ? ? ? ? ? ? ?  ?  ? ? ? ? ? ? ?Anesthesia Physical ?Anesthesia Plan ? ?ASA: 3 ? ?Anesthesia Plan: General  ? ?Post-op Pain Management: Lidocaine infusion* and Tylenol PO (pre-op)*  ? ?Induction: Intravenous ? ?PONV Risk Score and Plan: 3 and Ondansetron, Dexamethasone, Treatment may vary due to age or medical condition and Midazolam ? ?Airway Management Planned: Oral ETT ? ?Additional Equipment: None ? ?Intra-op Plan:  ? ?Post-operative Plan: Extubation in OR ? ?Informed Consent: I have reviewed the patients History and Physical, chart, labs and discussed the procedure including the risks, benefits and alternatives for the proposed anesthesia with the patient or authorized representative who has indicated his/her understanding and acceptance.  ? ? ? ? ? ?Plan Discussed with:  ? ?Anesthesia Plan Comments:   ? ? ? ? ?Anesthesia Quick Evaluation ? ?

## 2021-08-02 ENCOUNTER — Encounter (HOSPITAL_COMMUNITY): Payer: Self-pay | Admitting: Urology

## 2021-08-02 ENCOUNTER — Inpatient Hospital Stay (HOSPITAL_COMMUNITY): Payer: 59 | Admitting: Anesthesiology

## 2021-08-02 ENCOUNTER — Ambulatory Visit: Payer: 59

## 2021-08-02 ENCOUNTER — Encounter (HOSPITAL_COMMUNITY)
Admission: RE | Disposition: A | Discharge status: Home / Self Care | Payer: Self-pay | Source: Home / Self Care | Attending: Urology

## 2021-08-02 ENCOUNTER — Inpatient Hospital Stay (HOSPITAL_COMMUNITY)
Admission: RE | Admit: 2021-08-02 | Discharge: 2021-08-04 | DRG: 657 | Disposition: A | Discharge status: Home / Self Care | Payer: 59 | Attending: Urology | Admitting: Urology

## 2021-08-02 ENCOUNTER — Inpatient Hospital Stay (HOSPITAL_COMMUNITY): Payer: 59 | Admitting: Physician Assistant

## 2021-08-02 ENCOUNTER — Other Ambulatory Visit: Payer: Self-pay

## 2021-08-02 DIAGNOSIS — Z96642 Presence of left artificial hip joint: Secondary | ICD-10-CM | POA: Diagnosis present

## 2021-08-02 DIAGNOSIS — C642 Malignant neoplasm of left kidney, except renal pelvis: Secondary | ICD-10-CM | POA: Diagnosis present

## 2021-08-02 DIAGNOSIS — C7902 Secondary malignant neoplasm of left kidney and renal pelvis: Secondary | ICD-10-CM

## 2021-08-02 DIAGNOSIS — F17210 Nicotine dependence, cigarettes, uncomplicated: Secondary | ICD-10-CM | POA: Diagnosis present

## 2021-08-02 DIAGNOSIS — I700 Atherosclerosis of aorta: Secondary | ICD-10-CM | POA: Diagnosis present

## 2021-08-02 DIAGNOSIS — N2889 Other specified disorders of kidney and ureter: Secondary | ICD-10-CM | POA: Diagnosis present

## 2021-08-02 DIAGNOSIS — C7951 Secondary malignant neoplasm of bone: Secondary | ICD-10-CM | POA: Diagnosis present

## 2021-08-02 DIAGNOSIS — Z79899 Other long term (current) drug therapy: Secondary | ICD-10-CM | POA: Diagnosis not present

## 2021-08-02 DIAGNOSIS — Q8909 Congenital malformations of spleen: Secondary | ICD-10-CM | POA: Diagnosis not present

## 2021-08-02 DIAGNOSIS — M899 Disorder of bone, unspecified: Secondary | ICD-10-CM | POA: Diagnosis present

## 2021-08-02 HISTORY — DX: Other specified disorders of kidney and ureter: N28.89

## 2021-08-02 HISTORY — PX: ROBOT ASSISTED LAPAROSCOPIC NEPHRECTOMY: SHX5140

## 2021-08-02 LAB — BASIC METABOLIC PANEL
Anion gap: 10 (ref 5–15)
BUN: 15 mg/dL (ref 6–20)
CO2: 26 mmol/L (ref 22–32)
Calcium: 9.5 mg/dL (ref 8.9–10.3)
Chloride: 102 mmol/L (ref 98–111)
Creatinine, Ser: 0.81 mg/dL (ref 0.61–1.24)
GFR, Estimated: >60 mL/min (ref >60)
Glucose, Bld: 121 mg/dL — ABNORMAL HIGH (ref 70–99)
Potassium: 4.5 mmol/L (ref 3.5–5.1)
Sodium: 138 mmol/L (ref 135–145)

## 2021-08-02 LAB — CBC
HCT: 43 % (ref 39.0–52.0)
Hemoglobin: 13.4 g/dL (ref 13.0–17.0)
MCH: 28.9 pg (ref 26.0–34.0)
MCHC: 31.2 g/dL (ref 30.0–36.0)
MCV: 92.9 fL (ref 80.0–100.0)
Platelets: 239 10*3/uL (ref 150–400)
RBC: 4.63 MIL/uL (ref 4.22–5.81)
RDW: 12.7 % (ref 11.5–15.5)
WBC: 8.3 10*3/uL (ref 4.0–10.5)
nRBC: 0 % (ref 0.0–0.2)

## 2021-08-02 LAB — ABO/RH: ABO/RH(D): A POS

## 2021-08-02 SURGERY — NEPHRECTOMY, RADICAL, ROBOT-ASSISTED, LAPAROSCOPIC, ADULT
Anesthesia: General | Site: Renal | Laterality: Left

## 2021-08-02 MED ORDER — DIPHENHYDRAMINE HCL 12.5 MG/5ML PO ELIX: 12.5000 mg | ORAL_SOLUTION | Freq: Four times a day (QID) | ORAL | Status: DC | PRN

## 2021-08-02 MED ORDER — CEFAZOLIN SODIUM-DEXTROSE 1-4 GM/50ML-% IV SOLN
1.0000 g | Freq: Three times a day (TID) | INTRAVENOUS | Status: AC
  Administered 2021-08-02 (×2): 1 g via INTRAVENOUS
  Filled 2021-08-02 (×2): qty 50

## 2021-08-02 MED ORDER — AMISULPRIDE (ANTIEMETIC) 5 MG/2ML IV SOLN: 10.0000 mg | Freq: Once | INTRAVENOUS | Status: DC | PRN

## 2021-08-02 MED ORDER — LACTATED RINGERS IV SOLN: INTRAVENOUS | Status: DC

## 2021-08-02 MED ORDER — FENTANYL CITRATE (PF) 100 MCG/2ML IJ SOLN
INTRAMUSCULAR | Status: DC | PRN
  Administered 2021-08-02: 100 ug via INTRAVENOUS

## 2021-08-02 MED ORDER — KETOROLAC TROMETHAMINE 15 MG/ML IJ SOLN
INTRAMUSCULAR | Status: AC
  Administered 2021-08-02: 15 mg via INTRAVENOUS
  Filled 2021-08-02: qty 1

## 2021-08-02 MED ORDER — HYDROMORPHONE HCL 1 MG/ML IJ SOLN
0.5000 mg | INTRAMUSCULAR | Status: DC | PRN
  Administered 2021-08-02 (×2): 1 mg via INTRAVENOUS
  Filled 2021-08-02 (×2): qty 1

## 2021-08-02 MED ORDER — KETAMINE HCL 50 MG/5ML IJ SOSY
PREFILLED_SYRINGE | INTRAMUSCULAR | Status: AC
  Filled 2021-08-02: qty 5

## 2021-08-02 MED ORDER — STERILE WATER FOR IRRIGATION IR SOLN
Status: DC | PRN
  Administered 2021-08-02: 1000 mL

## 2021-08-02 MED ORDER — SODIUM CHLORIDE (PF) 0.9 % IJ SOLN
INTRAMUSCULAR | Status: AC
  Filled 2021-08-02: qty 20

## 2021-08-02 MED ORDER — PROPOFOL 10 MG/ML IV BOLUS
INTRAVENOUS | Status: AC
Start: 2021-08-02 — End: ?
  Filled 2021-08-02: qty 20

## 2021-08-02 MED ORDER — PROPOFOL 10 MG/ML IV BOLUS
INTRAVENOUS | Status: DC | PRN
Start: 2021-08-02 — End: 2021-08-02
  Administered 2021-08-02: 200 mg via INTRAVENOUS

## 2021-08-02 MED ORDER — ONDANSETRON HCL 4 MG/2ML IJ SOLN: 4.0000 mg | Freq: Once | INTRAMUSCULAR | Status: DC | PRN

## 2021-08-02 MED ORDER — BACITRACIN-NEOMYCIN-POLYMYXIN 400-5-5000 EX OINT
1.0000 | TOPICAL_OINTMENT | Freq: Three times a day (TID) | CUTANEOUS | Status: DC | PRN
Start: 2021-08-02 — End: 2021-08-04

## 2021-08-02 MED ORDER — FENTANYL CITRATE PF 50 MCG/ML IJ SOSY
PREFILLED_SYRINGE | INTRAMUSCULAR | Status: AC
  Administered 2021-08-02: 25 ug via INTRAVENOUS
  Filled 2021-08-02: qty 1

## 2021-08-02 MED ORDER — ACETAMINOPHEN 500 MG PO TABS
1000.0000 mg | ORAL_TABLET | Freq: Once | ORAL | Status: AC
  Administered 2021-08-02: 1000 mg via ORAL
  Filled 2021-08-02: qty 2

## 2021-08-02 MED ORDER — ROCURONIUM BROMIDE 10 MG/ML (PF) SYRINGE
PREFILLED_SYRINGE | INTRAVENOUS | Status: AC
  Filled 2021-08-02: qty 10

## 2021-08-02 MED ORDER — OXYCODONE HCL 5 MG PO TABS: 5.0000 mg | ORAL_TABLET | Freq: Once | ORAL | Status: DC | PRN

## 2021-08-02 MED ORDER — SUGAMMADEX SODIUM 200 MG/2ML IV SOLN
INTRAVENOUS | Status: DC | PRN
Start: 2021-08-02 — End: 2021-08-02
  Administered 2021-08-02: 200 mg via INTRAVENOUS

## 2021-08-02 MED ORDER — ONDANSETRON HCL 4 MG/2ML IJ SOLN
INTRAMUSCULAR | Status: DC | PRN
  Administered 2021-08-02: 4 mg via INTRAVENOUS

## 2021-08-02 MED ORDER — ONDANSETRON HCL 4 MG/2ML IJ SOLN
INTRAMUSCULAR | Status: AC
  Filled 2021-08-02: qty 2

## 2021-08-02 MED ORDER — ROCURONIUM BROMIDE 10 MG/ML (PF) SYRINGE
PREFILLED_SYRINGE | INTRAVENOUS | Status: DC | PRN
Start: 2021-08-02 — End: 2021-08-02
  Administered 2021-08-02: 100 mg via INTRAVENOUS
  Administered 2021-08-02: 20 mg via INTRAVENOUS
  Administered 2021-08-02: 10 mg via INTRAVENOUS

## 2021-08-02 MED ORDER — ONDANSETRON HCL 4 MG/2ML IJ SOLN
4.0000 mg | INTRAMUSCULAR | Status: DC | PRN
  Administered 2021-08-02: 4 mg via INTRAVENOUS
  Filled 2021-08-02: qty 2

## 2021-08-02 MED ORDER — PROPOFOL 10 MG/ML IV BOLUS
INTRAVENOUS | Status: AC
  Filled 2021-08-02: qty 20

## 2021-08-02 MED ORDER — SODIUM CHLORIDE 0.9 % IV SOLN
INTRAVENOUS | Status: DC
Start: 2021-08-02 — End: 2021-08-03

## 2021-08-02 MED ORDER — LACTATED RINGERS IR SOLN
Status: DC | PRN
  Administered 2021-08-02: 1000 mL

## 2021-08-02 MED ORDER — ORAL CARE MOUTH RINSE: 15.0000 mL | Freq: Once | OROMUCOSAL | Status: AC

## 2021-08-02 MED ORDER — DOCUSATE SODIUM 100 MG PO CAPS
100.0000 mg | ORAL_CAPSULE | Freq: Two times a day (BID) | ORAL | Status: DC
Start: 2021-08-02 — End: 2021-08-04
  Administered 2021-08-02 – 2021-08-04 (×4): 100 mg via ORAL
  Filled 2021-08-02 (×4): qty 1

## 2021-08-02 MED ORDER — DEXAMETHASONE SODIUM PHOSPHATE 10 MG/ML IJ SOLN
INTRAMUSCULAR | Status: AC
  Filled 2021-08-02: qty 1

## 2021-08-02 MED ORDER — HYOSCYAMINE SULFATE 0.125 MG SL SUBL
0.1250 mg | SUBLINGUAL_TABLET | SUBLINGUAL | Status: DC | PRN
  Filled 2021-08-02: qty 1

## 2021-08-02 MED ORDER — OXYCODONE HCL 5 MG/5ML PO SOLN: 5.0000 mg | Freq: Once | ORAL | Status: DC | PRN

## 2021-08-02 MED ORDER — LIDOCAINE HCL (PF) 2 % IJ SOLN
INTRAMUSCULAR | Status: DC | PRN
  Administered 2021-08-02: 1 mg/kg/h via INTRADERMAL

## 2021-08-02 MED ORDER — DIPHENHYDRAMINE HCL 50 MG/ML IJ SOLN: 12.5000 mg | Freq: Four times a day (QID) | INTRAMUSCULAR | Status: DC | PRN

## 2021-08-02 MED ORDER — PROMETHAZINE HCL 12.5 MG PO TABS: 12.5000 mg | ORAL_TABLET | ORAL | 0 refills | Status: DC | PRN

## 2021-08-02 MED ORDER — SODIUM CHLORIDE 0.9 % IV SOLN
INTRAVENOUS | Status: DC | PRN
  Administered 2021-08-02: 23 mL
  Administered 2021-08-02: 17 mL

## 2021-08-02 MED ORDER — DEXAMETHASONE SODIUM PHOSPHATE 10 MG/ML IJ SOLN
INTRAMUSCULAR | Status: DC | PRN
  Administered 2021-08-02: 10 mg via INTRAVENOUS

## 2021-08-02 MED ORDER — MIDAZOLAM HCL 5 MG/5ML IJ SOLN
INTRAMUSCULAR | Status: DC | PRN
Start: 2021-08-02 — End: 2021-08-02
  Administered 2021-08-02: 2 mg via INTRAVENOUS

## 2021-08-02 MED ORDER — BUPIVACAINE LIPOSOME 1.3 % IJ SUSP
INTRAMUSCULAR | Status: AC
  Filled 2021-08-02: qty 20

## 2021-08-02 MED ORDER — OXYCODONE HCL 5 MG PO TABS: 5.0000 mg | ORAL_TABLET | ORAL | Status: DC | PRN

## 2021-08-02 MED ORDER — MIDAZOLAM HCL 2 MG/2ML IJ SOLN
INTRAMUSCULAR | Status: AC
  Filled 2021-08-02: qty 2

## 2021-08-02 MED ORDER — TRAMADOL HCL 50 MG PO TABS
50.0000 mg | ORAL_TABLET | ORAL | Status: DC | PRN
  Administered 2021-08-02 – 2021-08-03 (×2): 50 mg via ORAL
  Filled 2021-08-02 (×2): qty 1

## 2021-08-02 MED ORDER — FENTANYL CITRATE PF 50 MCG/ML IJ SOSY
25.0000 ug | PREFILLED_SYRINGE | INTRAMUSCULAR | Status: DC | PRN
  Administered 2021-08-02: 50 ug via INTRAVENOUS
  Administered 2021-08-02 (×2): 25 ug via INTRAVENOUS

## 2021-08-02 MED ORDER — CEFAZOLIN SODIUM-DEXTROSE 2-4 GM/100ML-% IV SOLN
2.0000 g | Freq: Once | INTRAVENOUS | Status: DC
  Filled 2021-08-02: qty 100

## 2021-08-02 MED ORDER — DOCUSATE SODIUM 100 MG PO CAPS
100.0000 mg | ORAL_CAPSULE | Freq: Two times a day (BID) | ORAL | Status: DC
Start: 2021-08-02 — End: 2021-12-27

## 2021-08-02 MED ORDER — CHLORHEXIDINE GLUCONATE 0.12 % MT SOLN
15.0000 mL | Freq: Once | OROMUCOSAL | Status: AC
  Administered 2021-08-02: 15 mL via OROMUCOSAL

## 2021-08-02 MED ORDER — HYDROCODONE-ACETAMINOPHEN 5-325 MG PO TABS: 1.0000 | ORAL_TABLET | Freq: Four times a day (QID) | ORAL | 0 refills | Status: DC | PRN

## 2021-08-02 MED ORDER — KETOROLAC TROMETHAMINE 15 MG/ML IJ SOLN
15.0000 mg | Freq: Four times a day (QID) | INTRAMUSCULAR | Status: AC
  Administered 2021-08-02 – 2021-08-03 (×5): 15 mg via INTRAVENOUS
  Filled 2021-08-02 (×5): qty 1

## 2021-08-02 MED ORDER — FENTANYL CITRATE (PF) 100 MCG/2ML IJ SOLN
INTRAMUSCULAR | Status: AC
  Filled 2021-08-02: qty 2

## 2021-08-02 MED ORDER — HEMOSTATIC AGENTS (NO CHARGE) OPTIME
TOPICAL | Status: DC | PRN
  Administered 2021-08-02: 1 via TOPICAL

## 2021-08-02 MED ORDER — LIDOCAINE 2% (20 MG/ML) 5 ML SYRINGE
INTRAMUSCULAR | Status: DC | PRN
Start: 2021-08-02 — End: 2021-08-02
  Administered 2021-08-02: 60 mg via INTRAVENOUS

## 2021-08-02 MED ORDER — FENTANYL CITRATE PF 50 MCG/ML IJ SOSY
PREFILLED_SYRINGE | INTRAMUSCULAR | Status: AC
  Administered 2021-08-02: 25 ug via INTRAVENOUS
  Filled 2021-08-02: qty 2

## 2021-08-02 SURGICAL SUPPLY — 68 items
BAG COUNTER SPONGE SURGICOUNT (BAG) IMPLANT
BAG LAPAROSCOPIC 12 15 PORT 16 (BASKET) ×1 IMPLANT
BAG RETRIEVAL 12/15 (BASKET) ×2
CHLORAPREP W/TINT 26 (MISCELLANEOUS) ×2 IMPLANT
CLIP LIGATING HEM O LOK PURPLE (MISCELLANEOUS) ×2 IMPLANT
CLIP LIGATING HEMO LOK XL GOLD (MISCELLANEOUS) ×2 IMPLANT
CLIP LIGATING HEMO O LOK GREEN (MISCELLANEOUS) IMPLANT
COVER SURGICAL LIGHT HANDLE (MISCELLANEOUS) ×2 IMPLANT
COVER TIP SHEARS 8 DVNC (MISCELLANEOUS) ×1 IMPLANT
COVER TIP SHEARS 8MM DA VINCI (MISCELLANEOUS) ×2
CUTTER ECHEON FLEX ENDO 45 340 (ENDOMECHANICALS) ×1 IMPLANT
DERMABOND ADVANCED (GAUZE/BANDAGES/DRESSINGS) ×1
DERMABOND ADVANCED .7 DNX12 (GAUZE/BANDAGES/DRESSINGS) ×1 IMPLANT
DRAPE ARM DVNC X/XI (DISPOSABLE) ×4 IMPLANT
DRAPE COLUMN DVNC XI (DISPOSABLE) ×1 IMPLANT
DRAPE DA VINCI XI ARM (DISPOSABLE) ×8
DRAPE DA VINCI XI COLUMN (DISPOSABLE) ×2
DRAPE INCISE IOBAN 66X45 STRL (DRAPES) ×2 IMPLANT
DRAPE SHEET LG 3/4 BI-LAMINATE (DRAPES) ×2 IMPLANT
ELECT PENCIL ROCKER SW 15FT (MISCELLANEOUS) ×2 IMPLANT
ELECT REM PT RETURN 15FT ADLT (MISCELLANEOUS) ×2 IMPLANT
GAUZE 4X4 16PLY ~~LOC~~+RFID DBL (SPONGE) ×2 IMPLANT
GLOVE BIO SURGEON STRL SZ 6.5 (GLOVE) ×2 IMPLANT
GLOVE BIOGEL PI IND STRL 8 (GLOVE) ×1 IMPLANT
GLOVE BIOGEL PI INDICATOR 8 (GLOVE) ×1
GLOVE SURG LX 7.5 STRW (GLOVE) ×2
GLOVE SURG LX STRL 7.5 STRW (GLOVE) ×2 IMPLANT
GOWN SRG XL LVL 4 BRTHBL STRL (GOWNS) IMPLANT
GOWN STRL NON-REIN XL LVL4 (GOWNS) ×4
GOWN STRL REUS W/ TWL LRG LVL3 (GOWN DISPOSABLE) ×1 IMPLANT
GOWN STRL REUS W/ TWL XL LVL3 (GOWN DISPOSABLE) ×2 IMPLANT
GOWN STRL REUS W/TWL LRG LVL3 (GOWN DISPOSABLE) ×2
GOWN STRL REUS W/TWL XL LVL3 (GOWN DISPOSABLE) ×4
HEMOSTAT SURGICEL 4X8 (HEMOSTASIS) ×1 IMPLANT
HOLDER FOLEY CATH W/STRAP (MISCELLANEOUS) ×2 IMPLANT
IRRIG SUCT STRYKERFLOW 2 WTIP (MISCELLANEOUS) ×2
IRRIGATION SUCT STRKRFLW 2 WTP (MISCELLANEOUS) ×1 IMPLANT
IV LACTATED RINGERS 1000ML (IV SOLUTION) ×1 IMPLANT
KIT BASIN OR (CUSTOM PROCEDURE TRAY) ×2 IMPLANT
KIT TURNOVER KIT A (KITS) IMPLANT
MARKER SKIN DUAL TIP RULER LAB (MISCELLANEOUS) ×2 IMPLANT
NDL INSUFFLATION 14GA 120MM (NEEDLE) ×1 IMPLANT
NEEDLE INSUFFLATION 14GA 120MM (NEEDLE) ×2 IMPLANT
PROTECTOR NERVE ULNAR (MISCELLANEOUS) ×4 IMPLANT
RELOAD STAPLE 45 2.6 WHT THIN (STAPLE) IMPLANT
SCISSORS LAP 5X45 EPIX DISP (ENDOMECHANICALS) ×2 IMPLANT
SEAL CANN UNIV 5-8 DVNC XI (MISCELLANEOUS) ×3 IMPLANT
SEAL XI 5MM-8MM UNIVERSAL (MISCELLANEOUS) ×8
SET TUBE SMOKE EVAC HIGH FLOW (TUBING) ×2 IMPLANT
SOLUTION ELECTROLUBE (MISCELLANEOUS) ×2 IMPLANT
SPIKE FLUID TRANSFER (MISCELLANEOUS) ×3 IMPLANT
SPONGE T-LAP 18X18 ~~LOC~~+RFID (SPONGE) ×2 IMPLANT
STAPLE RELOAD 45 WHT (STAPLE) ×4 IMPLANT
STAPLE RELOAD 45MM WHITE (STAPLE) ×8
SUT MNCRL AB 4-0 PS2 18 (SUTURE) ×4 IMPLANT
SUT PDS AB 0 CT1 36 (SUTURE) ×4 IMPLANT
SUT VIC AB 0 CT1 27 (SUTURE) ×2
SUT VIC AB 0 CT1 27XBRD ANTBC (SUTURE) IMPLANT
SUT VIC AB 2-0 SH 27 (SUTURE) ×2
SUT VIC AB 2-0 SH 27X BRD (SUTURE) IMPLANT
SUT VICRYL 0 UR6 27IN ABS (SUTURE) ×1 IMPLANT
TOWEL OR 17X26 10 PK STRL BLUE (TOWEL DISPOSABLE) ×2 IMPLANT
TOWEL OR NON WOVEN STRL DISP B (DISPOSABLE) ×2 IMPLANT
TRAY FOLEY MTR SLVR 16FR STAT (SET/KITS/TRAYS/PACK) ×2 IMPLANT
TRAY LAPAROSCOPIC (CUSTOM PROCEDURE TRAY) ×2 IMPLANT
TROCAR BLADELESS OPT 5 100 (ENDOMECHANICALS) IMPLANT
TROCAR XCEL 12X100 BLDLESS (ENDOMECHANICALS) ×2 IMPLANT
WATER STERILE IRR 1000ML POUR (IV SOLUTION) ×2 IMPLANT

## 2021-08-02 NOTE — Discharge Instructions (Signed)

## 2021-08-02 NOTE — Anesthesia Postprocedure Evaluation (Signed)
Anesthesia Post Note ? ?Patient: Gordon Mcclain ? ?Procedure(s) Performed: XI ROBOTIC ASSISTED LAPAROSCOPIC RADICAL NEPHRECTOMY (Left: Renal) ? ?  ? ?Patient location during evaluation: PACU ?Anesthesia Type: General ?Level of consciousness: awake and alert ?Pain management: pain level controlled ?Vital Signs Assessment: post-procedure vital signs reviewed and stable ?Respiratory status: spontaneous breathing, nonlabored ventilation and respiratory function stable ?Cardiovascular status: blood pressure returned to baseline and stable ?Postop Assessment: no apparent nausea or vomiting ?Anesthetic complications: no ? ? ?No notable events documented. ? ?Last Vitals:  ?Vitals:  ? 08/02/21 1239 08/02/21 1618  ?BP: (!) 153/80 116/72  ?Pulse: 60 76  ?Resp: 18 15  ?Temp:  36.8 ?C  ?SpO2: 99% 98%  ?  ?Last Pain:  ?Vitals:  ? 08/02/21 1618  ?TempSrc: Oral  ?PainSc:   ? ? ?  ?  ?  ?  ?  ?  ? ?Lidia Collum ? ? ? ? ?

## 2021-08-02 NOTE — Transfer of Care (Signed)
Immediate Anesthesia Transfer of Care Note ? ?Patient: Gordon Mcclain ? ?Procedure(s) Performed: Procedure(s): ?XI ROBOTIC ASSISTED LAPAROSCOPIC RADICAL NEPHRECTOMY (Left) ? ?Patient Location: PACU ? ?Anesthesia Type:General ? ?Level of Consciousness: Alert, Awake, Oriented ? ?Airway & Oxygen Therapy: Patient Spontanous Breathing ? ?Post-op Assessment: Report given to RN ? ?Post vital signs: Reviewed and stable ? ?Last Vitals:  ?Vitals:  ? 08/02/21 0558  ?BP: 106/81  ?Pulse: 80  ?Resp: 18  ?Temp: 37.3 ?C  ?SpO2: 96%  ? ? ?Complications: No apparent anesthesia complications ? ?

## 2021-08-02 NOTE — Anesthesia Procedure Notes (Signed)
Procedure Name: Intubation ?Date/Time: 08/02/2021 7:21 AM ?Performed by: Gerald Leitz, CRNA ?Pre-anesthesia Checklist: Patient identified, Patient being monitored, Timeout performed, Emergency Drugs available and Suction available ?Patient Re-evaluated:Patient Re-evaluated prior to induction ?Oxygen Delivery Method: Circle system utilized ?Preoxygenation: Pre-oxygenation with 100% oxygen ?Induction Type: IV induction ?Ventilation: Mask ventilation without difficulty ?Laryngoscope Size: Mac and 3 ?Grade View: Grade I ?Tube type: Oral ?Tube size: 7.0 mm ?Number of attempts: 2 (1 x EMT Student (unsuccessful), 1 x CRNA (Successful)) ?Airway Equipment and Method: Stylet ?Placement Confirmation: ETT inserted through vocal cords under direct vision, positive ETCO2 and breath sounds checked- equal and bilateral ?Secured at: 21 cm ?Tube secured with: Tape ?Dental Injury: Teeth and Oropharynx as per pre-operative assessment  ? ? ? ? ?

## 2021-08-02 NOTE — Op Note (Signed)
Operative Note ? ?Preoperative diagnosis:  ?1.  Metastatic renal cell carcinoma originating from the left kidney ? ?Postoperative diagnosis: ?1.  Metastatic renal cell carcinoma originating from the left kidney ? ?Procedure(s): ?1.  Robot-assisted laparoscopic left radical nephrectomy ? ?Surgeon: Ellison Hughs, MD ? ?Assistants: 1.  Debbrah Alar, PA-C  An assistant was required for this surgical procedure.  The duties of the assistant included but were not limited to suctioning, passing suture, camera manipulation, retraction.  This procedure would not be able to be performed without an Environmental consultant.  ?2.  Davis Gourd, MD PGY 4 ? ?Anesthesia:  General ? ?Complications:  None ? ?EBL: 50 mL ? ?Specimens: ?1.  Left kidney and adrenal gland ? ?Drains/Catheters: ?1.  Foley catheter ? ?Intraoperative findings:   ?Hemostatic left renal hilum following staple ligation ? ?Indication:  Gordon Mcclain is a 46 y.o. male with solid enhancing left renal mass along with extensive metastasis.  The patient has undergone 4 rounds of Keytruda and is here today for a cytoreductive nephrectomy.  He has been consented for the above procedures, voices understanding and wishes to proceed. ? ?Description of procedure: ? ?After informed consent was obtained, the patient was brought to the operating room and general endotracheal anesthesia was administered.  The patient was then placed in the right lateral decubitus position and prepped and draped in usual sterile fashion.  A timeout was performed.  An 8 mm incision was then made lateral to the left rectus muscle at the level of the left 12th rib.  Abdominal access was obtained via a Veress needle.  The abdominal cavity was then insufflated up to 15 mmHg.  An 8 mm port was then introduced into the abdominal cavity.  Inspection of the port entry site by the robotic camera revealed no adjacent organ injury.  We then placed 3 additional 8 mm robotic ports to triangulate the left renal  hilum.  A 12 mm assistant port was then placed between the carmera port and 3rd robotic arm. ? ?The white line of Toldt along the descending colon was incised sharply and the colon, along with its mesocolonic fat, was reflected medially until the aorta was identified.  We then made a small window adjacent to the lower pole of the left kidney, identifying the left psoas muscle, left ureter and left gonadal vein.  The left ureter and gonadal vein were then reflected anteriorly allowing [place] to then incised the perihilar attachments using electrocautery.  We encountered a small lumbar vein adjacent to the insertion of the left gonadal vein into the left renal vein.  This lumbar vein was ligated with hemo-lock clips in 2 places and incised sharply.  This provided [place] excellent exposure to the left renal hilum.   ? ?A 45 mm endovascular stapler was then used to ligate the left renal artery and then the left renal vein, achieving excellent hemostasis.  The remaining peri-renal attachments were then excised using a combination of blunt dissection and electrocautery.  The left adrenal gland was spared.  The endovascular stapler was then used to ligate the left gonadal vein and left ureter.  Once the kidney was freely mobile, it was placed in Endo Catch bag to be be retrieved at the conclusion of the case. ? ?The robot was then de-docked.  A left lower quadrant Gibson incision was then made and the mass was removed within the Endo Catch bag.  The fascia within the midline assistant port was then closed using an interrupted 0 Vicryl  suture.  The fascia of the internal and external oblique was then closed using a 0 PDS suture in a running fashion.  The subcutaneous tissue within the [facility] incision was then closed using a running 0 Vicryl suture.  All skin incisions were then closed using 4-0 Monocryl and then dressed with Dermabond.  The patient tolerated the procedure well and was transferred to the postanesthesia in stable  condition.   ? ?Plan:  Monitor on the floor overnight  ? ?

## 2021-08-03 ENCOUNTER — Encounter: Payer: Self-pay | Admitting: Oncology

## 2021-08-03 ENCOUNTER — Encounter (HOSPITAL_COMMUNITY): Payer: Self-pay | Admitting: Urology

## 2021-08-03 LAB — BASIC METABOLIC PANEL
Anion gap: 8 (ref 5–15)
BUN: 21 mg/dL — ABNORMAL HIGH (ref 6–20)
CO2: 27 mmol/L (ref 22–32)
Calcium: 8.9 mg/dL (ref 8.9–10.3)
Chloride: 101 mmol/L (ref 98–111)
Creatinine, Ser: 1.15 mg/dL (ref 0.61–1.24)
GFR, Estimated: >60 mL/min (ref >60)
Glucose, Bld: 101 mg/dL — ABNORMAL HIGH (ref 70–99)
Potassium: 4.5 mmol/L (ref 3.5–5.1)
Sodium: 136 mmol/L (ref 135–145)

## 2021-08-03 LAB — HEMOGLOBIN AND HEMATOCRIT, BLOOD
HCT: 33.4 % — ABNORMAL LOW (ref 39.0–52.0)
Hemoglobin: 11.1 g/dL — ABNORMAL LOW (ref 13.0–17.0)

## 2021-08-03 MED ORDER — ACETAMINOPHEN 325 MG PO TABS
650.0000 mg | ORAL_TABLET | Freq: Four times a day (QID) | ORAL | Status: DC | PRN
  Administered 2021-08-03 – 2021-08-04 (×3): 650 mg via ORAL
  Filled 2021-08-03 (×3): qty 2

## 2021-08-03 NOTE — TOC Initial Note (Signed)
Transition of Care (TOC) - Initial/Assessment Note  ? ? ?Patient Details  ?Name: Gordon Mcclain ?MRN: [id number] ?Date of Birth: 1975-12-10 ? ?Transition of Care [facility]) CM/SW Contact:    ?Leeroy Cha, RN ?Phone Number: ?08/03/2021, 9:00 AM ? ?Clinical Narrative:                 ? ?Transition of Care (TOC) Screening Note ? ? ?Patient Details  ?Name: Gordon Mcclain ?Date of Birth: 1975-12-24 ? ? ?Transition of Care [facility]) CM/SW Contact:    ?Leeroy Cha, RN ?Phone Number: ?08/03/2021, 9:01 AM ? ? ? ?Transition of Care Department [facility]) has reviewed patient and no TOC needs have been identified at this time. We will continue to monitor patient advancement through interdisciplinary progression rounds. If new patient transition needs arise, please place a TOC consult. ? ? ? ?Expected Discharge Plan: Home/Self Care ?Barriers to Discharge: No Barriers Identified ? ? ?Patient Goals and CMS Choice ?Patient states their goals for this hospitalization and ongoing recovery are:: to go home ?CMS Medicare.gov Compare Post Acute Care list provided to:: Patient ?  ? ?Expected Discharge Plan and Services ?Expected Discharge Plan: Home/Self Care ?  ?Discharge Planning Services: CM Consult ?  ?Living arrangements for the past 2 months: [place] ?                ?  ?  ?  ?  ?  ?  ?  ?  ?  ?  ? ?Prior Living Arrangements/Services ?Living arrangements for the past 2 months: [place] ?Lives with:: Spouse ?Patient language and need for interpreter reviewed:: Yes ?Do you feel safe going back to the place where you live?: Yes      ?  ?  ?  ?Criminal Activity/Legal Involvement Pertinent to Current Situation/Hospitalization: No - Comment as needed ? ?Activities of Daily Living ?Home Assistive Devices/Equipment: Dentures (specify type), Eyeglasses ?ADL Screening (condition at time of admission) ?Patient's cognitive ability adequate to safely complete daily activities?: Yes ?Is the patient deaf or have  difficulty hearing?: No ?Does the patient have difficulty seeing, even when wearing glasses/contacts?: No ?Does the patient have difficulty concentrating, remembering, or making decisions?: No ?Patient able to express need for assistance with ADLs?: Yes ?Does the patient have difficulty dressing or bathing?: No ?Independently performs ADLs?: Yes (appropriate for developmental age) ?Does the patient have difficulty walking or climbing stairs?: No ?Weakness of Legs: None (left hip surgery 8 weeks ago) ?Weakness of Arms/Hands: None ? ?Permission Sought/Granted ?  ?  ?   ?   ?   ?   ? ?Emotional Assessment ?Appearance:: Appears stated age ?  ?  ?Orientation: : Oriented to Self, Oriented to Place, Oriented to  Time, Oriented to Situation ?Alcohol / Substance Use: Not Applicable ?Psych Involvement: No (comment) ? ?Admission diagnosis:  Renal mass [N28.89] ?Patient Active Problem List  ? Diagnosis Date Noted  ? Renal mass 08/02/2021  ? Metastasis to bone ([place]) 05/12/2021  ? Metastatic renal cell carcinoma to bone ([place]) 05/09/2021  ? Renal mass, left 04/20/2021  ? Rib lesion 04/20/2021  ? Chest wall pain 04/18/2021  ? ?PCP:  [name], DO ?Pharmacy:   ?[id number] [place], [place] - [address] ?[id number]?Phone: [phone]: [phone]? ? ? ? ?Social Determinants of Health (SDOH) Interventions ?  ? ?Readmission Risk Interventions ?   ? View :  No data to display.  ?  ?  ?  ? ? ? ?

## 2021-08-03 NOTE — Progress Notes (Signed)
Pt able to void 100 cc yellow urine post foley removal ?

## 2021-08-03 NOTE — Progress Notes (Signed)
1 Day Post op s/p left radical nephrectomy  ? ?Subjective: ?Did not sleep well overnight  ?Complaining of "gas pains" this morning; otherwise abdominal pain well controlled ?Ambulated without difficulty ?Passing small amount of flatus  ?No nausea or vomiting on clears  ? ?Objective: ?Vital signs in last 24 hours: ?Temp:  [96 ?F (35.6 ?C)-99.5 ?F (37.5 ?C)] 99.5 ?F (37.5 ?C) (05/10 0434) ?Pulse Rate:  [59-90] 85 (05/10 0434) ?Resp:  [10-18] 16 (05/10 0434) ?BP: (103-153)/(65-84) 117/66 (05/10 0434) ?SpO2:  [95 %-100 %] 95 % (05/10 0434) ? ?Intake/Output from previous day: ?05/09 0701 - 05/10 0700 ?In: 3198 [P.O.:290; I.V.:2808; IV Piggyback:100] ?Out: 610 [Urine:590; Blood:20] ?Intake/Output this shift: ?No intake/output data recorded. ? ?Physical Exam:  ?General: Alert and oriented ?CV: RRR ?Lungs: Clear ?Abdomen: Soft, ND, minimally tender. No drain ?Incisions: clean/dry/intact. No surrounding erythema. ?Gu: Catheter in place draining clear yellow urien   ?Ext: NT, No erythema ? ?Lab Results: ?Recent Labs  ?  08/02/21 ?1026 08/03/21 ?0450  ?HGB 13.4 11.1*  ?HCT 43.0 33.4*  ? ?BMET ?Recent Labs  ?  08/02/21 ?1026 08/03/21 ?0450  ?NA 138 136  ?K 4.5 4.5  ?CL 102 101  ?CO2 26 27  ?GLUCOSE 121* 101*  ?BUN 15 21*  ?CREATININE 0.81 1.15  ?CALCIUM 9.5 8.9  ? ? ? ?Studies/Results: ?No results found. ? ?Assessment/Plan: ?46 y/o male with metastatic RCC with 7cm right renal mass s/p right radical nephrectomy 08/02/21 ? ?Overall recovering appropriately. ? ?-Remove foley, trial of void ?-Stop fluids ?-Advance to regular diet  ?-Ambulate x 3 ?-IS ?-Monitor abdominal pain ?-Discharge this afternoon versus 5/11 ? ? LOS: 1 day  ? ?Valetta Fuller Verdine Grenfell ?08/03/2021, 8:02 AM ? ?  ?

## 2021-08-04 LAB — BASIC METABOLIC PANEL
Anion gap: 6 (ref 5–15)
BUN: 22 mg/dL — ABNORMAL HIGH (ref 6–20)
CO2: 27 mmol/L (ref 22–32)
Calcium: 8.8 mg/dL — ABNORMAL LOW (ref 8.9–10.3)
Chloride: 102 mmol/L (ref 98–111)
Creatinine, Ser: 1.36 mg/dL — ABNORMAL HIGH (ref 0.61–1.24)
GFR, Estimated: >60 mL/min (ref >60)
Glucose, Bld: 96 mg/dL (ref 70–99)
Potassium: 4.2 mmol/L (ref 3.5–5.1)
Sodium: 135 mmol/L (ref 135–145)

## 2021-08-04 LAB — CBC
HCT: 30.4 % — ABNORMAL LOW (ref 39.0–52.0)
Hemoglobin: 9.7 g/dL — ABNORMAL LOW (ref 13.0–17.0)
MCH: 29 pg (ref 26.0–34.0)
MCHC: 31.9 g/dL (ref 30.0–36.0)
MCV: 91 fL (ref 80.0–100.0)
Platelets: 219 10*3/uL (ref 150–400)
RBC: 3.34 MIL/uL — ABNORMAL LOW (ref 4.22–5.81)
RDW: 13.2 % (ref 11.5–15.5)
WBC: 6.3 10*3/uL (ref 4.0–10.5)
nRBC: 0 % (ref 0.0–0.2)

## 2021-08-04 NOTE — Progress Notes (Signed)
The patient remains stable, alert, oriented x4, ambulatory with use of walker. Discharge instructions were reviewed. Questions, cocnerns were denied at this time.  ?

## 2021-08-04 NOTE — TOC Transition Note (Signed)
Transition of Care (TOC) - CM/SW Discharge Note ? ? ?Patient Details  ?Name: RUSHI CHASEN ?MRN: [id number] ?Date of Birth: November 16, 1975 ? ?Transition of Care [facility]) CM/SW Contact:  ?Leeroy Cha, RN ?Phone Number: ?08/04/2021, 12:44 PM ? ? ?Clinical Narrative:    ?Dcd to return home. No toc needs present. ? ? ?Final next level of care: Home/Self Care ?Barriers to Discharge: No Barriers Identified ? ? ?Patient Goals and CMS Choice ?Patient states their goals for this hospitalization and ongoing recovery are:: to go home ?CMS Medicare.gov Compare Post Acute Care list provided to:: Patient ?  ? ?Discharge Placement ?  ?           ?  ?  ?  ?  ? ?Discharge Plan and Services ?  ?Discharge Planning Services: CM Consult ?           ?  ?  ?  ?  ?  ?  ?  ?  ?  ?  ? ?Social Determinants of Health (SDOH) Interventions ?  ? ? ?Readmission Risk Interventions ?   ? View : No data to display.  ?  ?  ?  ? ? ? ? ? ?

## 2021-08-04 NOTE — Discharge Summary (Signed)
Date of admission: 08/02/2021 ? ?Date of discharge: 08/04/2021 ? ?Admission diagnosis: Metastatic RCC  ? ?Discharge diagnosis: Metastatic RCC ? ?Secondary diagnoses:  ?Patient Active Problem List  ? Diagnosis Date Noted  ? Renal mass 08/02/2021  ? Metastasis to bone ([place]) 05/12/2021  ? 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 Renal mass, left 04/20/2021  ? Rib lesion 04/20/2021  ? Chest wall pain 04/18/2021  ? ? ?Procedures performed: ?Procedure(s): ?XI ROBOTIC ASSISTED LAPAROSCOPIC RADICAL NEPHRECTOMY ? ?History and Physical: For full details, please see admission history and physical. Briefly, Gordon Mcclain is a 46 y.o. year old patient with history of metastatic RCC with rib and hip lesions s/p palliative radiation and Keytruda. He presented for cytoreductive left radical nephrectomy.  ? ?Hospital Course: Patient tolerated the procedure well.  He was then transferred to the floor after an uneventful PACU stay.  His hospital course was uncomplicated.  On POD1 his catheter was removed and he passed a TOV. He was ambulating at his baseline and pain was well controlled. Overnight on POD1 he had an isolated fever to 101.74F. His vitals were otherwise stable and he was asymptomatic and felt well. We monitored him on POD2 and he did not have any additional fevers and felt clinically very well. On POD#2 he had met discharge criteria: was eating a regular diet, was up and ambulating independently,  pain was well controlled, was voiding without a catheter, and was ready to for discharge. ? ? ?Laboratory values:  ?Recent Labs  ?  08/02/21 ?1026 08/03/21 ?0450 08/04/21 ?0948  ?WBC 8.3  --  6.3  ?HGB 13.4 11.1* 9.7*  ?HCT 43.0 33.4* 30.4*  ? ?Recent Labs  ?  08/02/21 ?1026 08/03/21 ?0450 08/04/21 ?0948  ?NA 138 136 135  ?K 4.5 4.5 4.2  ?CL 102 101 102  ?CO2 _0 ?GLUCOSE 121* 101* 96  ?BUN 15 21* 22*  ?CREATININE 0.81 1.15 1.36*  ?CALCIUM 9.5 8.9 8.8*  ? ?No results for input(s): LABPT, INR in the  last 72 hours. ?No results for input(s): LABURIN in the last 72 hours. ?No results found for this or any previous visit. ? ?Disposition: Home ? ?Discharge instruction: The patient was instructed to be ambulatory but told to refrain from heavy lifting, strenuous activity, or driving.  ? ?Discharge medications:  ?Allergies as of 08/04/2021   ? ?   Reactions  ? Opdivo [nivolumab] Other (See Comments)  ? Acute lower back pain - see progress note from 07/20/2021 ?Patient able to complete infusion with additional fluids run concurrently.   ? ?  ? ?  ?Medication List  ?  ? ?STOP taking these medications   ? ?traMADol 50 MG tablet ?Commonly known as: ULTRAM ?  ? ?  ? ?TAKE these medications   ? ?acetaminophen 500 MG tablet ?Commonly known as: TYLENOL ?Take 1,000 mg by mouth every 6 (six) hours as needed for moderate pain. ?  ?diphenoxylate-atropine 2.5-0.025 MG tablet ?Commonly known as: Lomotil ?Take 1 tablet by mouth 4 (four) times daily as needed for diarrhea or loose stools. ?  ?docusate sodium 100 MG capsule ?Commonly known as: COLACE ?Take 1 capsule (100 mg total) by mouth 2 (two) times daily. ?  ?HYDROcodone-acetaminophen 5-325 MG tablet ?Commonly known as: Norco ?Take 1-2 tablets by mouth every 6 (six) hours as needed for moderate pain or severe pain. ?  ?hydrOXYzine 10 MG tablet ?Commonly known as: ATARAX ?Take 1 tablet (10 mg total) by mouth 3 (  three) times daily as needed. ?  ?lidocaine 5 % ?Commonly known as: LIDODERM ?Place 1 patch onto the skin daily. Remove & Discard patch within 12 hours or as directed by MD ?  ?prochlorperazine 10 MG tablet ?Commonly known as: COMPAZINE ?Take 1 tablet (10 mg total) by mouth every 6 (six) hours as needed for nausea or vomiting. ?  ?promethazine 12.5 MG tablet ?Commonly known as: PHENERGAN ?Take 1 tablet (12.5 mg total) by mouth every 4 (four) hours as needed for nausea or vomiting. ?  ?triamcinolone cream 0.1 % ?Commonly known as: KENALOG ?Apply 1 application. topically 2  (two) times daily. ?What changed:  ?when to take this ?reasons to take this ?  ? ?  ? ? ?Followup:  ? Follow-up Information   ? ? Ceasar Mons, MD Follow up on 08/18/2021.   ?Specialty: Urology ?Why: at 8:30 ?Contact information: ?[address] ?[address] ?[place] [address] ?[phone] ? ? ?  ?  ? ?  ?  ? ?  ? ? ? ?

## 2021-08-08 ENCOUNTER — Telehealth: Payer: Self-pay | Admitting: General Practice

## 2021-08-08 NOTE — Telephone Encounter (Signed)
Transition Care Management Follow-up Telephone Call ?Date of discharge and from where: 08/04/21 from [facility] ?How have you been since you were released from the hospital? Doing ok. Recovering from the surgery. ?Any questions or concerns? No ? ?Items Reviewed: ?Did the pt receive and understand the discharge instructions provided? Yes  ?Medications obtained and verified? No  ?Other? No  ?Any new allergies since your discharge? No  ?Dietary orders reviewed? Yes ?Do you have support at home? Yes  ? ?Home Care and Equipment/Supplies: ?Were home health services ordered? no ? ?Functional Questionnaire: (I = Independent and D = Dependent) ?ADLs: I ? ?Bathing/Dressing- I ? ?Meal Prep- I ? ?Eating- I ? ?Maintaining continence- I ? ?Transferring/Ambulation- I ? ?Managing Meds- I ? ?Follow up appointments reviewed: ? ?PCP Hospital f/u appt confirmed? No   ?Specialist Hospital f/u appt confirmed? Yes  Scheduled to see the surgeon on 08/18/21. ?Are transportation arrangements needed? No  ?If their condition worsens, is the pt aware to call PCP or go to the Emergency Dept.? Yes ?Was the patient provided with contact information for the PCP's office or ED? Yes ?Was to pt encouraged to call back with questions or concerns? Yes  ?

## 2021-08-09 ENCOUNTER — Ambulatory Visit: Payer: 59 | Admitting: Family Medicine

## 2021-08-09 LAB — SURGICAL PATHOLOGY

## 2021-08-24 ENCOUNTER — Other Ambulatory Visit: Payer: Self-pay

## 2021-08-24 ENCOUNTER — Inpatient Hospital Stay: Payer: 59 | Admitting: Oncology

## 2021-08-24 ENCOUNTER — Inpatient Hospital Stay: Payer: 59 | Attending: Physician Assistant

## 2021-08-24 ENCOUNTER — Inpatient Hospital Stay: Payer: 59

## 2021-08-24 VITALS — BP 106/70 | HR 66 | Temp 97.3°F | Resp 15 | Wt 174.0 lb

## 2021-08-24 DIAGNOSIS — Z79899 Other long term (current) drug therapy: Secondary | ICD-10-CM | POA: Diagnosis not present

## 2021-08-24 DIAGNOSIS — C7951 Secondary malignant neoplasm of bone: Secondary | ICD-10-CM

## 2021-08-24 DIAGNOSIS — Z5112 Encounter for antineoplastic immunotherapy: Secondary | ICD-10-CM | POA: Diagnosis present

## 2021-08-24 DIAGNOSIS — C649 Malignant neoplasm of unspecified kidney, except renal pelvis: Secondary | ICD-10-CM

## 2021-08-24 DIAGNOSIS — C642 Malignant neoplasm of left kidney, except renal pelvis: Secondary | ICD-10-CM | POA: Diagnosis present

## 2021-08-24 LAB — CMP (CANCER CENTER ONLY)
ALT: 17 U/L (ref 0–44)
AST: 19 U/L (ref 15–41)
Albumin: 4.3 g/dL (ref 3.5–5.0)
Alkaline Phosphatase: 95 U/L (ref 38–126)
Anion gap: 8 (ref 5–15)
BUN: 18 mg/dL (ref 6–20)
CO2: 27 mmol/L (ref 22–32)
Calcium: 10.3 mg/dL (ref 8.9–10.3)
Chloride: 103 mmol/L (ref 98–111)
Creatinine: 1.08 mg/dL (ref 0.61–1.24)
GFR, Estimated: >60 mL/min (ref >60)
Glucose, Bld: 88 mg/dL (ref 70–99)
Potassium: 4.1 mmol/L (ref 3.5–5.1)
Sodium: 138 mmol/L (ref 135–145)
Total Bilirubin: 0.7 mg/dL (ref 0.3–1.2)
Total Protein: 7.4 g/dL (ref 6.5–8.1)

## 2021-08-24 LAB — CBC WITH DIFFERENTIAL (CANCER CENTER ONLY)
Abs Immature Granulocytes: 0.02 10*3/uL (ref 0.00–0.07)
Basophils Absolute: 0.1 10*3/uL (ref 0.0–0.1)
Basophils Relative: 2 %
Eosinophils Absolute: 0.6 10*3/uL — ABNORMAL HIGH (ref 0.0–0.5)
Eosinophils Relative: 11 %
HCT: 39.8 % (ref 39.0–52.0)
Hemoglobin: 13.2 g/dL (ref 13.0–17.0)
Immature Granulocytes: 0 %
Lymphocytes Relative: 20 %
Lymphs Abs: 1 10*3/uL (ref 0.7–4.0)
MCH: 28.9 pg (ref 26.0–34.0)
MCHC: 33.2 g/dL (ref 30.0–36.0)
MCV: 87.1 fL (ref 80.0–100.0)
Monocytes Absolute: 0.5 10*3/uL (ref 0.1–1.0)
Monocytes Relative: 9 %
Neutro Abs: 3.1 10*3/uL (ref 1.7–7.7)
Neutrophils Relative %: 58 %
Platelet Count: 289 10*3/uL (ref 150–400)
RBC: 4.57 MIL/uL (ref 4.22–5.81)
RDW: 14.1 % (ref 11.5–15.5)
WBC Count: 5.2 10*3/uL (ref 4.0–10.5)
nRBC: 0 % (ref 0.0–0.2)

## 2021-08-24 LAB — TSH: TSH: 2.552 u[IU]/mL (ref 0.350–4.500)

## 2021-08-24 MED ORDER — SODIUM CHLORIDE 0.9 % IV SOLN
480.0000 mg | Freq: Once | INTRAVENOUS | Status: AC
  Administered 2021-08-24: 480 mg via INTRAVENOUS
  Filled 2021-08-24: qty 48

## 2021-08-24 MED ORDER — SODIUM CHLORIDE 0.9 % IV SOLN: Freq: Once | INTRAVENOUS | Status: AC

## 2021-08-24 NOTE — Progress Notes (Signed)
Hematology and Oncology Follow Up Visit  Gordon Mcclain [id number] 1975-09-29 46 y.o. 08/24/2021 8:01 AM [name], [place], [place], DO   Principle Diagnosis: 46 year old man with kidney cancer diagnosed in January 2023.  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   Prior Therapy:  He is status post left hip surgery completed in March 2023.  He underwent left hemiarthroplasty at that time.  Palliative radiation therapy to the left ribs with total of 30 Gray in 10 fractions completed in February 2023.  Ipilimumab 1 mg/kg and nivolumab 3 mg/kg started on May 18, 2021.  He completed 4 cycles of therapy on July 20, 2021.  Radiation therapy to the left hip for a total of 30 Gray in 10 fractions completed in May 2023.  He is status post robotic assisted left radical nephrectomy completed on Aug 02, 2021.  The final pathology showed predominantly extensive necrosis with 10% of the tumor cells viable indicating T1b.   Current therapy: Nivolumab 480 mg monthly maintenance set to start on Aug 24, 2021.      Interim History: Gordon Mcclain returns today for a follow-up visit.  Since last visit, he has underwent left radical nephrectomy on Aug 02, 2021 without any major complications.  The final pathology showed very little residual cancer at this time.  He tolerated the procedure well and has reasonably recovered at this time.  He resumed all activities of daily living.  He denies any bone pain or pathological fractures.  He denies any changes in his bowels.  Denies any skin rashes or lesions.    Medications: Updated on review. Current Outpatient Medications  Medication Sig Dispense Refill   acetaminophen (TYLENOL) 500 MG tablet Take 1,000 mg by mouth every 6 (six) hours as needed for moderate pain.     diphenoxylate-atropine (LOMOTIL) 2.5-0.025 MG tablet Take 1 tablet by mouth 4 (four) times daily as needed  for diarrhea or loose stools. (Patient not taking: Reported on 07/18/2021) 30 tablet 0   docusate sodium (COLACE) 100 MG capsule Take 1 capsule (100 mg total) by mouth 2 (two) times daily.     HYDROcodone-acetaminophen (NORCO) 5-325 MG tablet Take 1-2 tablets by mouth every 6 (six) hours as needed for moderate pain or severe pain. 20 tablet 0   hydrOXYzine (ATARAX) 10 MG tablet Take 1 tablet (10 mg total) by mouth 3 (three) times daily as needed. 30 tablet 0   lidocaine (LIDODERM) 5 % Place 1 patch onto the skin daily. Remove & Discard patch within 12 hours or as directed by MD (Patient not taking: Reported on 07/18/2021) 30 patch 0   prochlorperazine (COMPAZINE) 10 MG tablet Take 1 tablet (10 mg total) by mouth every 6 (six) hours as needed for nausea or vomiting. (Patient not taking: Reported on 07/18/2021) 30 tablet 0   promethazine (PHENERGAN) 12.5 MG tablet Take 1 tablet (12.5 mg total) by mouth every 4 (four) hours as needed for nausea or vomiting. 15 tablet 0   triamcinolone cream (KENALOG) 0.1 % Apply 1 application. topically 2 (two) times daily. (Patient taking differently: Apply 1 application. topically daily as needed (itching).) 453.6 g 1   No current facility-administered medications for this visit.     Allergies:  Allergies  Allergen Reactions   Opdivo [Nivolumab] Other (See Comments)    Acute lower back pain - see progress note from 07/20/2021 Patient able to complete infusion with additional fluids run concurrently.  Physical Exam:  Blood pressure 106/70, pulse 66, temperature (!) 97.3 F (36.3 C), temperature source Temporal, resp. rate 15, weight 174 lb (78.9 kg), SpO2 100 %.   ECOG: 1    General appearance: Alert, awake without any distress. Head: Atraumatic without abnormalities Oropharynx: Without any thrush or ulcers. Eyes: No scleral icterus. Lymph nodes: No lymphadenopathy noted in the cervical, supraclavicular, or axillary nodes Heart:regular rate and  rhythm, without any murmurs or gallops.   Lung: Clear to auscultation without any rhonchi, wheezes or dullness to percussion. Abdomin: Soft, nontender without any shifting dullness or ascites. Musculoskeletal: No clubbing or cyanosis. Neurological: No motor or sensory deficits. Skin: No rashes or lesions.     Lab Results: Lab Results  Component Value Date   WBC 6.3 08/04/2021   HGB 9.7 (L) 08/04/2021   HCT 30.4 (L) 08/04/2021   MCV 91.0 08/04/2021   PLT 219 08/04/2021     Chemistry      Component Value Date/Time   NA 135 08/04/2021 0948   K 4.2 08/04/2021 0948   CL 102 08/04/2021 0948   CO2 27 08/04/2021 0948   BUN 22 (H) 08/04/2021 0948   CREATININE 1.36 (H) 08/04/2021 0948   CREATININE 0.58 (L) 07/20/2021 0745      Component Value Date/Time   CALCIUM 8.8 (L) 08/04/2021 0948   ALKPHOS 107 07/20/2021 0745   AST 25 07/20/2021 0745   ALT 35 07/20/2021 0745   BILITOT 0.3 07/20/2021 0745          Impression and Plan:   46 year old with:  1.  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    He is status post immunotherapy, radiation and radical nephrectomy with very little residual disease left.  CT scan Jul 26, 2021 indicated positive response to therapy and his nephrectomy specimen showed very little residual tumor.  This indicates very favorable response to immunotherapy and I recommended continued maintenance with nivolumab.  Long-term complications including autoimmune issues including dermatological and GI toxicity were discussed.  He is agreeable to continue at this time.  We will update his staging scan and August 2023.   2.  IV access: Peripheral veins are currently in use without any issues.   3.  Antiemetics: Compazine is available to him without any complications.  4.  Left hip metastasis: Completed radiation therapy without any issues after surgical intervention.      5.   Immunotherapy surveillance: He has not experienced any complications at this time.  These include pneumonitis, colitis and thyroid disease.  We will continue to monitor.   6.  CNS screening: MRI of the brain obtained on Jul 27, 2021 showed no evidence of metastatic disease.  7.  Prognosis and goals of care: His disease is incurable but aggressive measures are warranted given his excellent response and very little residual disease after systemic therapy and surgery.  8.  Follow-up: In 4 weeks for next cycle of therapy.     30  minutes were spent on this visit.  The time was dedicated to reviewing laboratory data, pathology results, disease status update and treatment choices for the future.   Zola Button, MD 5/31/20238:01 AM

## 2021-08-24 NOTE — Patient Instructions (Signed)
[facility]   ?Discharge Instructions: ?Thank you for choosing [facility] to provide your oncology and hematology care.  ? ?If you have a lab appointment with the Cancer Center, please go directly to the Cancer Center and check in at the registration area. ?  ?Wear comfortable clothing and clothing appropriate for easy access to any Portacath or PICC line.  ? ?We strive to give you quality time with your provider. You may need to reschedule your appointment if you arrive late (15 or more minutes).  Arriving late affects you and other patients whose appointments are after yours.  Also, if you miss three or more appointments without notifying the office, you may be dismissed from the clinic at the provider?s discretion.    ?  ?For prescription refill requests, have your pharmacy contact our office and allow 72 hours for refills to be completed.   ? ?Today you received the following chemotherapy and/or immunotherapy agents: Nivolumab (Opdivo)    ?  ?To help prevent nausea and vomiting after your treatment, we encourage you to take your nausea medication as directed. ? ?BELOW ARE SYMPTOMS THAT SHOULD BE REPORTED IMMEDIATELY: ?*FEVER GREATER THAN 100.4 F (38 ?C) OR HIGHER ?*CHILLS OR SWEATING ?*NAUSEA AND VOMITING THAT IS NOT CONTROLLED WITH YOUR NAUSEA MEDICATION ?*UNUSUAL SHORTNESS OF BREATH ?*UNUSUAL BRUISING OR BLEEDING ?*URINARY PROBLEMS (pain or burning when urinating, or frequent urination) ?*BOWEL PROBLEMS (unusual diarrhea, constipation, pain near the anus) ?TENDERNESS IN MOUTH AND THROAT WITH OR WITHOUT PRESENCE OF ULCERS (sore throat, sores in mouth, or a toothache) ?UNUSUAL RASH, SWELLING OR PAIN  ?UNUSUAL VAGINAL DISCHARGE OR ITCHING  ? ?Items with * indicate a potential emergency and should be followed up as soon as possible or go to the Emergency Department if any problems should occur. ? ?Please show the CHEMOTHERAPY ALERT CARD or IMMUNOTHERAPY ALERT CARD at  check-in to the Emergency Department and triage nurse. ? ?Should you have questions after your visit or need to cancel or reschedule your appointment, please contact [facility]  Dept: [phone]  and follow the prompts.  Office hours are 8:00 a.m. to 4:30 p.m. Monday - Friday. Please note that voicemails left after 4:00 p.m. may not be returned until the following business day.  We are closed weekends and major holidays. You have access to a nurse at all times for urgent questions. Please call the main number to the clinic Dept: [phone] and follow the prompts. ? ? ?For any non-urgent questions, you may also contact your provider using MyChart. We now offer e-Visits for anyone 18 and older to request care online for non-urgent symptoms. For details visit [email or web address]. ?  ?Also download the MyChart app! Go to the app store, search "MyChart", open the app, select , and log in with your MyChart username and password. ? ?Due to Covid, a mask is required upon entering the hospital/clinic. If you do not have a mask, one will be given to you upon arrival. For doctor visits, patients may have 1 support person aged 18 or older with them. For treatment visits, patients cannot have anyone with them due to current Covid guidelines and our immunocompromised population.  ? ?

## 2021-08-25 ENCOUNTER — Ambulatory Visit: Payer: 59 | Admitting: Family Medicine

## 2021-08-31 ENCOUNTER — Encounter: Payer: Self-pay | Admitting: Urology

## 2021-08-31 ENCOUNTER — Other Ambulatory Visit: Payer: Self-pay | Admitting: Nurse Practitioner

## 2021-08-31 NOTE — Progress Notes (Signed)
  Radiation Oncology         [phone]) [phone] ________________________________  Name: Gordon Mcclain MRN: [id number]  Date: 07/29/2021  DOB: October 17, 1975  End of Treatment Note  Diagnosis:    46 yo man with metastatic clear cell RCC s/p hemiarthroplasty of bony metastases in the left hip      Indication for treatment:  Palliative adjuvant postoperative radiotherapy       Radiation treatment dates:   07/20/21 - 07/29/21  Site/dose:   The left hip target was treated to 30 Gy in 10 fractions of 3 Gy each.  Beams/energy:   A 3D field set-up was employed with 6 MV X-rays  Narrative: The patient tolerated radiation treatment relatively well.     Plan: The patient has completed radiation treatment. The patient will return to radiation oncology clinic for routine followup in one month. I advised him to call or return sooner if he has any questions or concerns related to his recovery or treatment. ________________________________  Sheral Apley. Tammi Klippel, M.D.

## 2021-08-31 NOTE — Progress Notes (Signed)
Telephone appointment. I spoke w/ patient's spouse Mrs. Gordon Mcclain, verified her identity and began nursing interview. No issues reported at this time.  Meaningful use complete.  Reminded of patient's 9:30am-09/01/21 telephone appointment w/ Ashlyn Bruning PA-C. I left my extension [phone] in case patient needs anything. Spouse verbalized understanding.  Patient contact [phone] or [phone]

## 2021-09-01 ENCOUNTER — Ambulatory Visit
Admission: RE | Admit: 2021-09-01 | Discharge: 2021-09-01 | Disposition: A | Discharge status: Home / Self Care | Payer: 59 | Source: Ambulatory Visit | Attending: Urology | Admitting: Urology

## 2021-09-01 DIAGNOSIS — C649 Malignant neoplasm of unspecified kidney, except renal pelvis: Secondary | ICD-10-CM

## 2021-09-01 NOTE — Progress Notes (Signed)
Radiation Oncology         [phone]) [phone] ________________________________  Name: Gordon Mcclain MRN: [id number]  Date: 09/01/2021  DOB: [birth year]  Post Treatment Note  CC: [name], DO  Orson Slick, MD  Diagnosis:   46 yo man with painful left rib and left femur metastases from metastatic renal cell carcinoma     Interval Since Last Radiation:  4.5 weeks  07/20/21 - 07/29/21:  The left hip target was treated to 30 Gy in 10 fractions of 3 Gy each.  05/16/21 - 05/24/21: The painful metastatic lesion in the left seventh rib was treated to 30 Gy in 10 fractions of 3 Gy each.  Narrative:  I spoke with the patient to conduct his routine scheduled 1 month follow up visit via telephone to spare the patient unnecessary potential exposure in the healthcare setting during the current COVID-19 pandemic.  The patient was notified in advance and gave permission to proceed with this visit format.  He tolerated radiation treatment relatively well with only mild fatigue and mild skin irritation in the treatment field.                              On review of systems, the patient states that he is doing well in general and is without complaints.  He does continue with mild fatigue but reports that the skin irritation in the treatment field has completely resolved at this point.  He is not having any pain in the ribs or the left femur.  He had successful tumor resection and left hemiarthroplasty under the care and direction of Dr. Mylo Red at [facility] on 05/26/2021, prior to his most recent radiation, and has made good progress with regaining strength and mobility in the LLE.  He has a follow-up visit with Dr. Mylo Red in 12/2021 for repeat left femur imaging.  He completed 4 cycles of combo immunotherapy with ipilimumab/nivolumab in May 2023 which he tolerated well.  Since we saw him last, he underwent a assisted laparoscopic nephrectomy with Dr. Lovena Neighbours on 08/02/2021.  Final surgical pathology showed  predominantly necrotic cells with 10% viable tumor cells indicating T1b disease.  He is now on single agent nivolumab maintenance therapy which was started on 08/24/2021 with plans for restaging scans in August 2023 prior to his next follow-up visit with Dr. Alen Blew.  He will also continue in routine follow-up under the care and direction of Dr. Lovena Neighbours at [facility] with his next visit scheduled for 09/20/21.  Overall, he is quite pleased with his progress to date.  ALLERGIES:  is allergic to opdivo [nivolumab].  Meds: Current Outpatient Medications  Medication Sig Dispense Refill   acetaminophen (TYLENOL) 500 MG tablet Take 1,000 mg by mouth every 6 (six) hours as needed for moderate pain.     diphenoxylate-atropine (LOMOTIL) 2.5-0.025 MG tablet Take 1 tablet by mouth 4 (four) times daily as needed for diarrhea or loose stools. (Patient not taking: Reported on 07/18/2021) 30 tablet 0   docusate sodium (COLACE) 100 MG capsule Take 1 capsule (100 mg total) by mouth 2 (two) times daily.     HYDROcodone-acetaminophen (NORCO) 5-325 MG tablet Take 1-2 tablets by mouth every 6 (six) hours as needed for moderate pain or severe pain. 20 tablet 0   hydrOXYzine (ATARAX) 10 MG tablet Take 1 tablet (10 mg total) by mouth 3 (three) times daily as needed. 30 tablet 0   lidocaine (LIDODERM) 5 %  Place 1 patch onto the skin daily. Remove & Discard patch within 12 hours or as directed by MD (Patient not taking: Reported on 07/18/2021) 30 patch 0   prochlorperazine (COMPAZINE) 10 MG tablet Take 1 tablet (10 mg total) by mouth every 6 (six) hours as needed for nausea or vomiting. (Patient not taking: Reported on 07/18/2021) 30 tablet 0   promethazine (PHENERGAN) 12.5 MG tablet Take 1 tablet (12.5 mg total) by mouth every 4 (four) hours as needed for nausea or vomiting. 15 tablet 0   triamcinolone cream (KENALOG) 0.1 % Apply 1 application. topically 2 (two) times daily. (Patient taking differently: Apply 1 application.  topically daily as needed (itching).) 453.6 g 1   No current facility-administered medications for this encounter.    Physical Findings:  vitals were not taken for this visit.  Pain Assessment Pain Score: 0-No pain/10 Unable to assess due to telephone follow-up visit format.  Lab Findings: Lab Results  Component Value Date   WBC 5.2 08/24/2021   HGB 13.2 08/24/2021   HCT 39.8 08/24/2021   MCV 87.1 08/24/2021   PLT 289 08/24/2021     Radiographic Findings: No results found.  Impression/Plan: 41. 46 yo man with painful left rib metastasis from metastatic renal cell carcinoma. He appears to have recovered well from the effects of his recent palliative radiotherapy to the left seventh rib and post-op radiation to the left femur and is currently without complaints. He has a follow-up visit with Dr. Mylo Red in 12/2021 for repeat femur imaging.  He will also continue in routine follow-up under the care and direction of Dr. Alen Blew and Dr. Lovena Neighbours for continued management of his systemic disease with plans for restaging scans in August 2023 prior to his next follow-up visit with Dr. Alen Blew.  His next scheduled follow-up visit with Dr. Lovena Neighbours is 09/20/2021.  We discussed that while we are happy to continue to participate in his care if clinically indicated, at this point, we will plan to see him back on an as-needed basis.  We enjoyed taking care of him and look forward to continuing to follow his progress.  He knows that he is welcome to call at anytime in the interim with any questions or concerns related to his previous radiation.    Nicholos Johns, PA-C

## 2021-09-07 ENCOUNTER — Ambulatory Visit: Payer: Self-pay | Admitting: Urology

## 2021-09-15 ENCOUNTER — Telehealth: Payer: Self-pay | Admitting: Oncology

## 2021-09-15 NOTE — Telephone Encounter (Signed)
Called patient regarding upcoming August appointments, patient has been called and notified. 

## 2021-09-29 ENCOUNTER — Inpatient Hospital Stay: Payer: 59

## 2021-09-29 ENCOUNTER — Inpatient Hospital Stay: Payer: 59 | Admitting: Oncology

## 2021-09-29 ENCOUNTER — Other Ambulatory Visit: Payer: Self-pay

## 2021-09-29 ENCOUNTER — Inpatient Hospital Stay: Payer: 59 | Attending: Physician Assistant

## 2021-09-29 VITALS — BP 112/74 | HR 66 | Temp 97.7°F | Resp 17 | Ht 65.0 in | Wt 177.6 lb

## 2021-09-29 VITALS — BP 112/74 | HR 57 | Temp 98.7°F | Resp 16

## 2021-09-29 DIAGNOSIS — C649 Malignant neoplasm of unspecified kidney, except renal pelvis: Secondary | ICD-10-CM

## 2021-09-29 DIAGNOSIS — C7951 Secondary malignant neoplasm of bone: Secondary | ICD-10-CM | POA: Diagnosis present

## 2021-09-29 DIAGNOSIS — Z5112 Encounter for antineoplastic immunotherapy: Secondary | ICD-10-CM | POA: Diagnosis present

## 2021-09-29 DIAGNOSIS — Z905 Acquired absence of kidney: Secondary | ICD-10-CM | POA: Insufficient documentation

## 2021-09-29 DIAGNOSIS — C642 Malignant neoplasm of left kidney, except renal pelvis: Secondary | ICD-10-CM | POA: Diagnosis present

## 2021-09-29 LAB — CBC WITH DIFFERENTIAL (CANCER CENTER ONLY)
Abs Immature Granulocytes: 0.02 10*3/uL (ref 0.00–0.07)
Basophils Absolute: 0.1 10*3/uL (ref 0.0–0.1)
Basophils Relative: 2 %
Eosinophils Absolute: 0.2 10*3/uL (ref 0.0–0.5)
Eosinophils Relative: 5 %
HCT: 41.3 % (ref 39.0–52.0)
Hemoglobin: 13.8 g/dL (ref 13.0–17.0)
Immature Granulocytes: 0 %
Lymphocytes Relative: 20 %
Lymphs Abs: 0.9 10*3/uL (ref 0.7–4.0)
MCH: 29.3 pg (ref 26.0–34.0)
MCHC: 33.4 g/dL (ref 30.0–36.0)
MCV: 87.7 fL (ref 80.0–100.0)
Monocytes Absolute: 0.4 10*3/uL (ref 0.1–1.0)
Monocytes Relative: 8 %
Neutro Abs: 3 10*3/uL (ref 1.7–7.7)
Neutrophils Relative %: 65 %
Platelet Count: 226 10*3/uL (ref 150–400)
RBC: 4.71 MIL/uL (ref 4.22–5.81)
RDW: 14.6 % (ref 11.5–15.5)
WBC Count: 4.6 10*3/uL (ref 4.0–10.5)
nRBC: 0 % (ref 0.0–0.2)

## 2021-09-29 LAB — CMP (CANCER CENTER ONLY)
ALT: 14 U/L (ref 0–44)
AST: 17 U/L (ref 15–41)
Albumin: 4.1 g/dL (ref 3.5–5.0)
Alkaline Phosphatase: 80 U/L (ref 38–126)
Anion gap: 7 (ref 5–15)
BUN: 19 mg/dL (ref 6–20)
CO2: 27 mmol/L (ref 22–32)
Calcium: 9.8 mg/dL (ref 8.9–10.3)
Chloride: 104 mmol/L (ref 98–111)
Creatinine: 1.13 mg/dL (ref 0.61–1.24)
GFR, Estimated: >60 mL/min (ref >60)
Glucose, Bld: 116 mg/dL — ABNORMAL HIGH (ref 70–99)
Potassium: 3.8 mmol/L (ref 3.5–5.1)
Sodium: 138 mmol/L (ref 135–145)
Total Bilirubin: 0.6 mg/dL (ref 0.3–1.2)
Total Protein: 7.1 g/dL (ref 6.5–8.1)

## 2021-09-29 LAB — TSH: TSH: 1.832 u[IU]/mL (ref 0.350–4.500)

## 2021-09-29 MED ORDER — SODIUM CHLORIDE 0.9 % IV SOLN: Freq: Once | INTRAVENOUS | Status: AC

## 2021-09-29 MED ORDER — SODIUM CHLORIDE 0.9 % IV SOLN: 240.0000 mg | Freq: Once | INTRAVENOUS | Status: DC

## 2021-09-29 MED ORDER — SODIUM CHLORIDE 0.9 % IV SOLN
480.0000 mg | Freq: Once | INTRAVENOUS | Status: AC
  Administered 2021-09-29: 480 mg via INTRAVENOUS
  Filled 2021-09-29: qty 48

## 2021-09-29 NOTE — Patient Instructions (Signed)
[facility]  Discharge Instructions: Thank you for choosing [facility] to provide your oncology and hematology care.   If you have a lab appointment with the [facility], please go directly to the [facility] and check in at the registration area.   Wear comfortable clothing and clothing appropriate for easy access to any Portacath or PICC line.   We strive to give you quality time with your provider. You may need to reschedule your appointment if you arrive late (15 or more minutes).  Arriving late affects you and other patients whose appointments are after yours.  Also, if you miss three or more appointments without notifying the office, you may be dismissed from the clinic at the provider's discretion.      For prescription refill requests, have your pharmacy contact our office and allow 72 hours for refills to be completed.    Today you received the following chemotherapy and/or immunotherapy agent: Nivolumab (Opdivo)   To help prevent nausea and vomiting after your treatment, we encourage you to take your nausea medication as directed.  BELOW ARE SYMPTOMS THAT SHOULD BE REPORTED IMMEDIATELY: *FEVER GREATER THAN 100.4 F (38 C) OR HIGHER *CHILLS OR SWEATING *NAUSEA AND VOMITING THAT IS NOT CONTROLLED WITH YOUR NAUSEA MEDICATION *UNUSUAL SHORTNESS OF BREATH *UNUSUAL BRUISING OR BLEEDING *URINARY PROBLEMS (pain or burning when urinating, or frequent urination) *BOWEL PROBLEMS (unusual diarrhea, constipation, pain near the anus) TENDERNESS IN MOUTH AND THROAT WITH OR WITHOUT PRESENCE OF ULCERS (sore throat, sores in mouth, or a toothache) UNUSUAL RASH, SWELLING OR PAIN  UNUSUAL VAGINAL DISCHARGE OR ITCHING   Items with * indicate a potential emergency and should be followed up as soon as possible or go to the Emergency Department if any problems should occur.  Please show the CHEMOTHERAPY ALERT CARD or IMMUNOTHERAPY ALERT CARD at  check-in to the Emergency Department and triage nurse.  Should you have questions after your visit or need to cancel or reschedule your appointment, please contact [facility]  Dept: [phone]  and follow the prompts.  Office hours are 8:00 a.m. to 4:30 p.m. Monday - Friday. Please note that voicemails left after 4:00 p.m. may not be returned until the following business day.  We are closed weekends and major holidays. You have access to a nurse at all times for urgent questions. Please call the main number to the clinic Dept: [phone] and follow the prompts.   For any non-urgent questions, you may also contact your provider using MyChart. We now offer e-Visits for anyone 33 and older to request care online for non-urgent symptoms. For details visit [email or web address].   Also download the MyChart app! Go to the app store, search "MyChart", open the app, select , and log in with your MyChart username and password.  Masks are optional in the cancer centers. If you would like for your care team to wear a mask while they are taking care of you, please let them know. For doctor visits, patients may have with them one support person who is at least 47 years old. At this time, visitors are not allowed in the infusion area.

## 2021-09-29 NOTE — Addendum Note (Signed)
Addended by: Wyatt Portela on: 09/29/2021 08:51 AM   Modules accepted: Orders

## 2021-09-29 NOTE — Progress Notes (Signed)
Confirmed Nivolumab dose w/ Dr. Alen Blew - pt is receiving 480 mg q28 days.   Dose updated in tx plan accordingly.  Kennith Center, Pharm.D., CPP 09/29/2021'@8'$ :47 AM

## 2021-09-29 NOTE — Progress Notes (Signed)
Approximately 20 minutes into Nivolumab infusion pt. complaines of lower back pain that radiates down the right leg. Rates pain 6/10. Infusion stopped, Normal saline bolus started.Vital signs stable. Dr. Alen Blew notified, decreased Nivolumab rate by 50% to 148 cc/hr. and continued with normal saline at 250 cc per hour. Pt. states pain went away and treatment completed at 50% rate without difficulty. Pharmacy and Dr. Alen Blew notified of completion.

## 2021-09-29 NOTE — Progress Notes (Signed)
Hematology and Oncology Follow Up Visit  Gordon Mcclain [id number] Jul 23, 1975 46 y.o. 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  He was found to have disease to the bone and peritoneal adenopathy.   Prior Therapy:  He is status post left hip surgery completed in March 2023.  He underwent left hemiarthroplasty at that time.  Palliative radiation therapy to the left ribs with total of 30 Gray in 10 fractions completed in February 2023.  Ipilimumab 1 mg/kg and nivolumab 3 mg/kg started on May 18, 2021.  He completed 4 cycles of therapy on July 20, 2021.  Radiation therapy to the left hip for a total of 30 Gray in 10 fractions completed in May 2023.  He is status post robotic assisted left radical nephrectomy completed on Aug 02, 2021.  The final pathology showed predominantly extensive necrosis with 10% of the tumor cells viable indicating T1b.   Current therapy: Nivolumab 480 mg monthly maintenance started on Aug 24, 2021.  He is here for the next cycle of treatment.      Interim History: Gordon Mcclain is here for a follow-up evaluation.  Since last visit, he reports feeling well without any major complaints.  He has reported some occasional abdominal discomfort and gas buildup that is relieved by burping and passing gas.  He reports no nausea, vomiting or abdominal pain he denies any weight loss or appetite changes.  His performance status quality of life remained excellent.  He is ambulating without any difficulties and has resumed all work-related duties.    Medications: Reviewed without changes. Current Outpatient Medications  Medication Sig Dispense Refill   acetaminophen (TYLENOL) 500 MG tablet Take 1,000 mg by mouth every 6 (six) hours as needed for moderate pain.     diphenoxylate-atropine (LOMOTIL) 2.5-0.025 MG tablet Take 1  tablet by mouth 4 (four) times daily as needed for diarrhea or loose stools. (Patient not taking: Reported on 07/18/2021) 30 tablet 0   docusate sodium (COLACE) 100 MG capsule Take 1 capsule (100 mg total) by mouth 2 (two) times daily.     HYDROcodone-acetaminophen (NORCO) 5-325 MG tablet Take 1-2 tablets by mouth every 6 (six) hours as needed for moderate pain or severe pain. 20 tablet 0   hydrOXYzine (ATARAX) 10 MG tablet Take 1 tablet (10 mg total) by mouth 3 (three) times daily as needed. 30 tablet 0   lidocaine (LIDODERM) 5 % Place 1 patch onto the skin daily. Remove & Discard patch within 12 hours or as directed by MD (Patient not taking: Reported on 07/18/2021) 30 patch 0   prochlorperazine (COMPAZINE) 10 MG tablet Take 1 tablet (10 mg total) by mouth every 6 (six) hours as needed for nausea or vomiting. (Patient not taking: Reported on 07/18/2021) 30 tablet 0   promethazine (PHENERGAN) 12.5 MG tablet Take 1 tablet (12.5 mg total) by mouth every 4 (four) hours as needed for nausea or vomiting. 15 tablet 0   triamcinolone cream (KENALOG) 0.1 % Apply 1 application. topically 2 (two) times daily. (Patient taking differently: Apply 1 application. topically daily as needed (itching).) 453.6 g 1   No current facility-administered medications for this visit.     Allergies:  Allergies  Allergen Reactions   Opdivo [Nivolumab] Other (See Comments)    Acute lower back pain - see progress note from 07/20/2021 Patient able to complete infusion with additional fluids run  concurrently.      Physical Exam:  Blood pressure 112/74, pulse 66, temperature 97.7 F (36.5 C), temperature source Temporal, resp. rate 17, height '5\' 5"'$  (1.651 m), weight 177 lb 9.6 oz (80.6 kg), SpO2 100 %.    ECOG: 1    General appearance: Comfortable appearing without any discomfort Head: Normocephalic without any trauma Oropharynx: Mucous membranes are moist and pink without any thrush or ulcers. Eyes: Pupils are equal  and round reactive to light. Lymph nodes: No cervical, supraclavicular, inguinal or axillary lymphadenopathy.   Heart:regular rate and rhythm.  S1 and S2 without leg edema. Lung: Clear without any rhonchi or wheezes.  No dullness to percussion. Abdomin: Soft, nontender, nondistended with good bowel sounds.  No hepatosplenomegaly. Musculoskeletal: No joint deformity or effusion.  Full range of motion noted. Neurological: No deficits noted on motor, sensory and deep tendon reflex exam. Skin: No petechial rash or dryness.  Appeared moist.  s.     Lab Results: Lab Results  Component Value Date   WBC 5.2 08/24/2021   HGB 13.2 08/24/2021   HCT 39.8 08/24/2021   MCV 87.1 08/24/2021   PLT 289 08/24/2021     Chemistry      Component Value Date/Time   NA 138 08/24/2021 0827   K 4.1 08/24/2021 0827   CL 103 08/24/2021 0827   CO2 27 08/24/2021 0827   BUN 18 08/24/2021 0827   CREATININE 1.08 08/24/2021 0827      Component Value Date/Time   CALCIUM 10.3 08/24/2021 0827   ALKPHOS 95 08/24/2021 0827   AST 19 08/24/2021 0827   ALT 17 08/24/2021 0827   BILITOT 0.7 08/24/2021 0827          Impression and Plan:   70 year old with:  1.  Kidney cancer diagnosed in January 2023.  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    He remains on maintenance nivolumab at this time after excellent response to immunotherapy and radical nephrectomy.  Plan is to continue with maintenance therapy for at least 2 years of total treatment.  We will update his staging scans and 4 weeks.  He is agreeable to proceed.   2.  IV access: No issues reported with the current peripheral vein usage.  Port-A-Cath option has been deferred.   3.  Antiemetics: No nausea or vomiting reported at this time Compazine is available to him  4.  Left hip metastasis: Status post surgical fixation without any complications at this time.       5.  Immunotherapy surveillance: No issues reported at this time.  He denies any pneumonitis, colitis and thyroid disease.   6.  CNS screening: No evidence of metastatic disease noted.  MRI of the brain May 2023 showed no issues.  7.  Prognosis and goals of care: Aggressive measures are warranted given his excellent performance status and reasonable response to treatment.  His disease might not be curable however.  8.  Follow-up: In 1 month for the next cycle of therapy.     30  minutes were dedicated to this encounter.  The time was spent on reviewing laboratory data, disease status update outlining future plan of care discussion.   Zola Button, MD 7/6/20237:59 AM

## 2021-09-29 NOTE — Progress Notes (Signed)
Pt states he received additional fluids d/t reaction to opdivo. Per nursing note, additional fluids ran and opdivo was tolerated. Running additional fluids today.

## 2021-10-03 ENCOUNTER — Encounter: Payer: Self-pay | Admitting: Family Medicine

## 2021-10-03 ENCOUNTER — Ambulatory Visit (INDEPENDENT_AMBULATORY_CARE_PROVIDER_SITE_OTHER): Payer: 59 | Admitting: Family Medicine

## 2021-10-03 DIAGNOSIS — C649 Malignant neoplasm of unspecified kidney, except renal pelvis: Secondary | ICD-10-CM | POA: Diagnosis not present

## 2021-10-03 DIAGNOSIS — C7951 Secondary malignant neoplasm of bone: Secondary | ICD-10-CM | POA: Diagnosis not present

## 2021-10-03 NOTE — Progress Notes (Signed)
Gordon Mcclain - 46 y.o. male MRN [id number]  Date of birth: [birth year]  Subjective Chief Complaint  Patient presents with   renal cell carcinoma    HPI Gordon Mcclain is a 46 y.o. male here today for follow up visit.    Fairly healthy until recently when diagnosed with stage IV RCC.  He reports that he is feeling pretty good.  Recovered well from surgeries.  He is followed by oncology and remains on Opdivo for treatment of his metastatic disease.  He does have an upcoming PET scan to assess efficacy of current treatment.  Mood remains good and he remains optimistic.  No additional concerns at this time.   ROS:  A comprehensive ROS was completed and negative except as noted per HPI  Allergies  Allergen Reactions   Opdivo [Nivolumab] Other (See Comments)    Acute lower back pain - see progress note from 07/20/2021 and 09/29/21 Patient able to complete infusion with additional fluids run concurrently.     Past Medical History:  Diagnosis Date   Frequent headaches    occipital region mostly: takes ibuprofen 800 mg bid most days   Palpitations    Zio patch reassuring 04/2021   Renal cancer ([place]) 03/2021   Left->presented with bone met to L rib.   Tobacco dependence     Past Surgical History:  Procedure Laterality Date   PARTIAL HIP ARTHROPLASTY Left    PILONIDAL CYST EXCISION  approx 2002   ROBOT ASSISTED LAPAROSCOPIC NEPHRECTOMY Left 08/02/2021   Procedure: XI ROBOTIC ASSISTED LAPAROSCOPIC RADICAL NEPHRECTOMY;  Surgeon: Ceasar Mons, MD;  Location: [facility];  Service: Urology;  Laterality: Left;   WISDOM TOOTH EXTRACTION     zio patch     04/2021 zio reassuring.    Social History   Socioeconomic History   Marital status: Married    Spouse name: Not on file   Number of children: Not on file   Years of education: Not on file   Highest education level: Not on file  Occupational History   Not on file  Tobacco Use   Smoking status: Former    Packs/day:  1.00    Years: 20.00    Total pack years: 20.00    Types: Cigarettes    Quit date: 04/27/2021    Years since quitting: 0.4    Passive exposure: Never   Smokeless tobacco: Never  Vaping Use   Vaping Use: Never used  Substance and Sexual Activity   Alcohol use: No   Drug use: No   Sexual activity: Yes    Partners: Female  Other Topics Concern   Not on file  Social History Narrative   Married, 1 daughter and one son.   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   Orig from [place], [place] area.   Education: HS.   Tob 20  pck-yr hx (current as of 03/2014).   Alc: none.  No hx of drug use.   Caffeine: 3-4 cups coffee/day, 1 cup tea per day.   No exercise.   Social Determinants of Health   Financial Resource Strain: Not on file  Food Insecurity: Not on file  Transportation Needs: Not on file  Physical Activity: Not on file  Stress: Not on file  Social Connections: Not on file    Family History  Problem Relation Age of Onset   Arthritis Mother    Cancer Maternal Aunt    Heart attack Maternal Uncle    Heart attack Maternal Grandmother  Heart attack Maternal Grandfather    Heart attack Maternal Aunt     Health Maintenance  Topic Date Due   COLONOSCOPY (Pts 45-76yrs Insurance coverage will need to be confirmed)  05/12/2022 (Originally 08/05/2019)   Hepatitis C Screening  10/04/2022 (Originally 08/04/1992)   HIV Screening  10/04/2022 (Originally 08/04/1989)   INFLUENZA VACCINE  10/25/2021   TETANUS/TDAP  06/20/2029   HPV VACCINES  Aged Out   COVID-19 Vaccine  Discontinued     ----------------------------------------------------------------------------------------------------------------------------------------------------------------------------------------------------------------- Physical Exam BP 106/68 (BP Location: Left Arm, Patient Position: Sitting, Cuff Size: Normal)   Pulse 63   Ht $R'5\' 5"'Zb$  (1.651 m)   Wt 178 lb (80.7 kg)   SpO2  99%   BMI 29.62 kg/m   Physical Exam Constitutional:      Appearance: Normal appearance.  Neurological:     General: No focal deficit present.     Mental Status: He is alert.  Psychiatric:        Mood and Affect: Mood normal.        Behavior: Behavior normal.     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  Management per Oncology.  Has upcoming scan.  Handling stress well.    No orders of the defined types were placed in this encounter.   Return in about 6 months (around 04/05/2022) for RCC.    This visit occurred during the SARS-CoV-2 public health emergency.  Safety protocols were in place, including screening questions prior to the visit, additional usage of staff PPE, and extensive cleaning of exam room while observing appropriate contact time as indicated for disinfecting solutions.

## 2021-10-03 NOTE — Assessment & Plan Note (Signed)
Doing well with current treatment.  Management per Oncology.  Has upcoming scan.  Handling stress well.

## 2021-10-17 ENCOUNTER — Other Ambulatory Visit: Payer: Self-pay

## 2021-10-25 ENCOUNTER — Encounter (HOSPITAL_COMMUNITY): Payer: Self-pay

## 2021-10-25 ENCOUNTER — Ambulatory Visit (HOSPITAL_COMMUNITY): Payer: 59

## 2021-10-25 ENCOUNTER — Ambulatory Visit (HOSPITAL_COMMUNITY)
Admission: RE | Admit: 2021-10-25 | Discharge: 2021-10-25 | Disposition: A | Discharge status: Home / Self Care | Payer: 59 | Source: Ambulatory Visit | Attending: Oncology | Admitting: Oncology

## 2021-10-25 DIAGNOSIS — C649 Malignant neoplasm of unspecified kidney, except renal pelvis: Secondary | ICD-10-CM | POA: Insufficient documentation

## 2021-10-25 DIAGNOSIS — C7951 Secondary malignant neoplasm of bone: Secondary | ICD-10-CM | POA: Insufficient documentation

## 2021-10-25 MED ORDER — SODIUM CHLORIDE (PF) 0.9 % IJ SOLN
INTRAMUSCULAR | Status: AC
  Filled 2021-10-25: qty 50

## 2021-10-25 MED ORDER — IOHEXOL 300 MG/ML  SOLN
100.0000 mL | Freq: Once | INTRAMUSCULAR | Status: AC | PRN
  Administered 2021-10-25: 100 mL via INTRAVENOUS

## 2021-10-26 ENCOUNTER — Encounter: Payer: Self-pay | Admitting: Oncology

## 2021-10-27 ENCOUNTER — Inpatient Hospital Stay: Payer: 59

## 2021-10-27 ENCOUNTER — Inpatient Hospital Stay: Payer: 59 | Attending: Physician Assistant | Admitting: Oncology

## 2021-10-27 ENCOUNTER — Other Ambulatory Visit: Payer: Self-pay

## 2021-10-27 VITALS — BP 112/77 | HR 66 | Temp 98.2°F | Resp 16 | Ht 65.0 in | Wt 180.6 lb

## 2021-10-27 DIAGNOSIS — C7951 Secondary malignant neoplasm of bone: Secondary | ICD-10-CM | POA: Diagnosis present

## 2021-10-27 DIAGNOSIS — Z79899 Other long term (current) drug therapy: Secondary | ICD-10-CM | POA: Insufficient documentation

## 2021-10-27 DIAGNOSIS — C649 Malignant neoplasm of unspecified kidney, except renal pelvis: Secondary | ICD-10-CM

## 2021-10-27 DIAGNOSIS — Z5112 Encounter for antineoplastic immunotherapy: Secondary | ICD-10-CM | POA: Insufficient documentation

## 2021-10-27 DIAGNOSIS — C642 Malignant neoplasm of left kidney, except renal pelvis: Secondary | ICD-10-CM | POA: Diagnosis present

## 2021-10-27 LAB — CMP (CANCER CENTER ONLY)
ALT: 10 U/L (ref 0–44)
AST: 16 U/L (ref 15–41)
Albumin: 4.1 g/dL (ref 3.5–5.0)
Alkaline Phosphatase: 101 U/L (ref 38–126)
Anion gap: 7 (ref 5–15)
BUN: 18 mg/dL (ref 6–20)
CO2: 26 mmol/L (ref 22–32)
Calcium: 9.2 mg/dL (ref 8.9–10.3)
Chloride: 104 mmol/L (ref 98–111)
Creatinine: 1.08 mg/dL (ref 0.61–1.24)
GFR, Estimated: >60 mL/min (ref >60)
Glucose, Bld: 134 mg/dL — ABNORMAL HIGH (ref 70–99)
Potassium: 3.8 mmol/L (ref 3.5–5.1)
Sodium: 137 mmol/L (ref 135–145)
Total Bilirubin: 0.6 mg/dL (ref 0.3–1.2)
Total Protein: 7.2 g/dL (ref 6.5–8.1)

## 2021-10-27 LAB — CBC WITH DIFFERENTIAL (CANCER CENTER ONLY)
Abs Immature Granulocytes: 0.02 10*3/uL (ref 0.00–0.07)
Basophils Absolute: 0.1 10*3/uL (ref 0.0–0.1)
Basophils Relative: 1 %
Eosinophils Absolute: 0.2 10*3/uL (ref 0.0–0.5)
Eosinophils Relative: 3 %
HCT: 41.1 % (ref 39.0–52.0)
Hemoglobin: 14 g/dL (ref 13.0–17.0)
Immature Granulocytes: 0 %
Lymphocytes Relative: 13 %
Lymphs Abs: 0.7 10*3/uL (ref 0.7–4.0)
MCH: 30.3 pg (ref 26.0–34.0)
MCHC: 34.1 g/dL (ref 30.0–36.0)
MCV: 89 fL (ref 80.0–100.0)
Monocytes Absolute: 0.4 10*3/uL (ref 0.1–1.0)
Monocytes Relative: 7 %
Neutro Abs: 4.2 10*3/uL (ref 1.7–7.7)
Neutrophils Relative %: 76 %
Platelet Count: 254 10*3/uL (ref 150–400)
RBC: 4.62 MIL/uL (ref 4.22–5.81)
RDW: 13.1 % (ref 11.5–15.5)
WBC Count: 5.6 10*3/uL (ref 4.0–10.5)
nRBC: 0 % (ref 0.0–0.2)

## 2021-10-27 LAB — TSH: TSH: 2.494 u[IU]/mL (ref 0.350–4.500)

## 2021-10-27 MED ORDER — SODIUM CHLORIDE 0.9 % IV SOLN
480.0000 mg | Freq: Once | INTRAVENOUS | Status: AC
  Administered 2021-10-27: 480 mg via INTRAVENOUS
  Filled 2021-10-27: qty 48

## 2021-10-27 MED ORDER — SODIUM CHLORIDE 0.9 % IV SOLN: Freq: Once | INTRAVENOUS | Status: AC

## 2021-10-27 NOTE — Progress Notes (Signed)
Hematology and Oncology Follow Up Visit  BASHEER MOLCHAN [id number] [birth year] 46 y.o. 10/27/2021 7:46 AM [name], DOMatthews, [place], DO   Principle Diagnosis: 46 year old man with kidney cancer diagnosed in January 2023.  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   Prior Therapy:  He is status post left hip surgery completed in March 2023.  He underwent left hemiarthroplasty at that time.  Palliative radiation therapy to the left ribs with total of 30 Gray in 10 fractions completed in February 2023.  Ipilimumab 1 mg/kg and nivolumab 3 mg/kg started on May 18, 2021.  He completed 4 cycles of therapy on July 20, 2021.  Radiation therapy to the left hip for a total of 30 Gray in 10 fractions completed in May 2023.  He is status post robotic assisted left radical nephrectomy completed on Aug 02, 2021.  The final pathology showed predominantly extensive necrosis with 10% of the tumor cells viable indicating T1b.   Current therapy: Nivolumab 480 mg monthly maintenance started on Aug 24, 2021.  He presents for a subsequent cycle of therapy.      Interim History: Mr. Mcloud returns today for a follow-up.  As a last visit, he reports no major complaints.  He has tolerated nivolumab without any complications.  He denies any nausea, vomiting or abdominal pain.  He does report occasional rib pain as well as lower abdominal discomfort related to his previous surgery and radiation.  He denies any skin rashes or lesions.  He denies any cough wheezing or hemoptysis.   Medications: Updated on review. Current Outpatient Medications  Medication Sig Dispense Refill   acetaminophen (TYLENOL) 500 MG tablet Take 1,000 mg by mouth every 6 (six) hours as needed for moderate pain. (Patient not taking: Reported on 10/03/2021)     diphenoxylate-atropine (LOMOTIL) 2.5-0.025 MG tablet Take 1 tablet by mouth 4 (four) times daily as  needed for diarrhea or loose stools. (Patient not taking: Reported on 07/18/2021) 30 tablet 0   docusate sodium (COLACE) 100 MG capsule Take 1 capsule (100 mg total) by mouth 2 (two) times daily. (Patient not taking: Reported on 10/03/2021)     HYDROcodone-acetaminophen (NORCO) 5-325 MG tablet Take 1-2 tablets by mouth every 6 (six) hours as needed for moderate pain or severe pain. (Patient not taking: Reported on 10/03/2021) 20 tablet 0   hydrOXYzine (ATARAX) 10 MG tablet Take 1 tablet (10 mg total) by mouth 3 (three) times daily as needed. (Patient not taking: Reported on 10/03/2021) 30 tablet 0   prochlorperazine (COMPAZINE) 10 MG tablet Take 1 tablet (10 mg total) by mouth every 6 (six) hours as needed for nausea or vomiting. (Patient not taking: Reported on 07/18/2021) 30 tablet 0   promethazine (PHENERGAN) 12.5 MG tablet Take 1 tablet (12.5 mg total) by mouth every 4 (four) hours as needed for nausea or vomiting. (Patient not taking: Reported on 10/03/2021) 15 tablet 0   triamcinolone cream (KENALOG) 0.1 % Apply 1 application. topically 2 (two) times daily. (Patient not taking: Reported on 10/03/2021) 453.6 g 1   No current facility-administered medications for this visit.     Allergies:  Allergies  Allergen Reactions   Opdivo [Nivolumab] Other (See Comments)    Acute lower back pain - see progress note from 07/20/2021 and 09/29/21 Patient able to complete infusion with additional fluids run concurrently.      Physical Exam:  Blood pressure 112/77, pulse 66, temperature 98.2 F (  36.8 C), temperature source Oral, resp. rate 16, height '5\' 5"'$  (1.651 m), weight 180 lb 9.6 oz (81.9 kg), SpO2 100 %.     ECOG: 1   General appearance: Alert, awake without any distress. Head: Atraumatic without abnormalities Oropharynx: Without any thrush or ulcers. Eyes: No scleral icterus. Lymph nodes: No lymphadenopathy noted in the cervical, supraclavicular, or axillary nodes Heart:regular rate and  rhythm, without any murmurs or gallops.   Lung: Clear to auscultation without any rhonchi, wheezes or dullness to percussion. Abdomin: Soft, nontender without any shifting dullness or ascites. Musculoskeletal: No clubbing or cyanosis. Neurological: No motor or sensory deficits. Skin: No rashes or lesions.     Lab Results: Lab Results  Component Value Date   WBC 4.6 09/29/2021   HGB 13.8 09/29/2021   HCT 41.3 09/29/2021   MCV 87.7 09/29/2021   PLT 226 09/29/2021     Chemistry      Component Value Date/Time   NA 138 09/29/2021 0759   K 3.8 09/29/2021 0759   CL 104 09/29/2021 0759   CO2 27 09/29/2021 0759   BUN 19 09/29/2021 0759   CREATININE 1.13 09/29/2021 0759      Component Value Date/Time   CALCIUM 9.8 09/29/2021 0759   ALKPHOS 80 09/29/2021 0759   AST 17 09/29/2021 0759   ALT 14 09/29/2021 0759   BILITOT 0.6 09/29/2021 0759      IMPRESSION: 1. New postoperative changes of left radical nephrectomy, with small postoperative fluid collection in the nephrectomy bed, likely a postoperative seroma. No definitive evidence to suggest residual or locally recurrent disease, and no definite findings to suggest new metastatic disease in the chest, abdomen or pelvis. 2. Treated lesion in the lateral aspect of the left seventh rib is stable in appearance, but today's study demonstrates evolving postradiation mass-like fibrosis in the adjacent left lung. Attention on follow-up studies is recommended to ensure contraction of this region. 3. Exophytic heterogeneously enhancing nodule extending off the inferior aspect of the isthmus of the thyroid gland measuring 2.7 x 2.3 cm. Recommend thyroid US (ref: J Am Coll Radiol. 2015 Feb;12(2): 143-50). 4. Aortic atherosclerosis. 5. Additional incidental findings, as above.      Impression and Plan:   46 year old with:  1.  Stage IV intermediate risk clear-cell with metastasis to the ribs, left hip and subcutaneous nodules.     He continues to be on nivolumab maintenance without any major complications.  CT scan obtained on August 1 showed no evidence of disease progression.  Risks and benefits of continuing nivolumab were discussed.  2 years of follow-up total immunotherapy would be recommended at this time.  He is agreeable to continue at this time.   2.  IV access: Peripheral veins remain in use at this time without any issues.   3.  Antiemetics: Compazine is available to him without any nausea or vomiting.  4.  Left hip metastasis: No issues with pain noted currently.  He is status post hip surgery.      5.  Immunotherapy surveillance: I continue to educate him about complication clued pneumonitis, colitis and thyroid disease.   6.  CNS screening: MRI of the brain obtained in May 2023 showed no evidence of metastatic disease.  This will be updated in the future.  7.  Prognosis and goals of care: He has no evidence of active disease at this time and aggressive measures are warranted.  8.  Follow-up: He will return in 4 weeks for next cycle of therapy.  30  minutes were spent on this visit.  The time was dedicated to reviewing laboratory data, disease status update and outlining future plan of care discussion.   Zola Button, MD 8/3/20237:46 AM

## 2021-10-27 NOTE — Patient Instructions (Signed)
[facility] MEDICAL [facility]COL[facility]   Discharge Instructions: Thank you for choosing [facility] to provide your oncology and hematology care.   If you have a lab appointment with the Cancer Center, please go directly to the Cancer Center and check in at the registration area.   Wear comfortable clothing and clothing appropriate for easy access to any Portacath or PICC line.   We strive to give you quality time with your provider. You may need to reschedule your appointment if you arrive late (15 or more minutes).  Arriving late affects you and other patients whose appointments are after yours.  Also, if you miss three or more appointments without notifying the office, you may be dismissed from the clinic at the provider's discretion.      For prescription refill requests, have your pharmacy contact our office and allow 72 hours for refills to be completed.    Today you received the following chemotherapy and/or immunotherapy agents: nivolumab      To help prevent nausea and vomiting after your treatment, we encourage you to take your nausea medication as directed.  BELOW ARE SYMPTOMS THAT SHOULD BE REPORTED IMMEDIATELY: *FEVER GREATER THAN 100.4 F (38 C) OR HIGHER *CHILLS OR SWEATING *NAUSEA AND VOMITING THAT IS NOT CONTROLLED WITH YOUR NAUSEA MEDICATION *UNUSUAL SHORTNESS OF BREATH *UNUSUAL BRUISING OR BLEEDING *URINARY PROBLEMS (pain or burning when urinating, or frequent urination) *BOWEL PROBLEMS (unusual diarrhea, constipation, pain near the anus) TENDERNESS IN MOUTH AND THROAT WITH OR WITHOUT PRESENCE OF ULCERS (sore throat, sores in mouth, or a toothache) UNUSUAL RASH, SWELLING OR PAIN  UNUSUAL VAGINAL DISCHARGE OR ITCHING   Items with * indicate a potential emergency and should be followed up as soon as possible or go to the Emergency Department if any problems should occur.  Please show the CHEMOTHERAPY ALERT CARD or IMMUNOTHERAPY ALERT CARD at check-in  to the Emergency Department and triage nurse.  Should you have questions after your visit or need to cancel or reschedule your appointment, please contact [facility]  Dept: [phone]  and follow the prompts.  Office hours are 8:00 a.m. to 4:30 p.m. Monday - Friday. Please note that voicemails left after 4:00 p.m. may not be returned until the following business day.  We are closed weekends and major holidays. You have access to a nurse at all times for urgent questions. Please call the main number to the clinic Dept: [phone] and follow the prompts.   For any non-urgent questions, you may also contact your provider using MyChart. We now offer e-Visits for anyone 18 and older to request care online for non-urgent symptoms. For details visit [email or web address].   Also download the MyChart app! Go to the app store, search "MyChart", open the app, select [facility], and log in with your MyChart username and password.  Masks are optional in the cancer centers. If you would like for your care team to wear a mask while they are taking care of you, please let them know. You may have one support person who is at least 47 years old accompany you for your appointments. 

## 2021-11-01 ENCOUNTER — Telehealth: Payer: Self-pay

## 2021-11-01 ENCOUNTER — Encounter: Payer: Self-pay | Admitting: Oncology

## 2021-11-01 NOTE — Telephone Encounter (Signed)
Patient called and stated that on Saturday while he was working outside he became dizzy and everything started to spin. This improved once he rested inside but he has since had intermittent dizziness, and headaches relieved with Tylenol, in addition to constant lethargy and foggy-headedness. He stated that he has checked his BP which has been normal and he is drinking plenty of fluids. Patient offered to be seen in [facility] clinic which he accepted and was made aware that he will be contacted to arrange that appointment. Dr. Alen Blew made aware.

## 2021-11-02 ENCOUNTER — Inpatient Hospital Stay: Payer: 59

## 2021-11-02 ENCOUNTER — Inpatient Hospital Stay (HOSPITAL_BASED_OUTPATIENT_CLINIC_OR_DEPARTMENT_OTHER): Payer: 59 | Admitting: Physician Assistant

## 2021-11-02 ENCOUNTER — Other Ambulatory Visit: Payer: Self-pay

## 2021-11-02 VITALS — BP 130/83 | HR 81 | Temp 98.8°F | Resp 16 | Wt 179.1 lb

## 2021-11-02 DIAGNOSIS — C7951 Secondary malignant neoplasm of bone: Secondary | ICD-10-CM

## 2021-11-02 DIAGNOSIS — S0990XA Unspecified injury of head, initial encounter: Secondary | ICD-10-CM

## 2021-11-02 DIAGNOSIS — C649 Malignant neoplasm of unspecified kidney, except renal pelvis: Secondary | ICD-10-CM | POA: Diagnosis not present

## 2021-11-02 DIAGNOSIS — Z5112 Encounter for antineoplastic immunotherapy: Secondary | ICD-10-CM | POA: Diagnosis not present

## 2021-11-02 LAB — CBC WITH DIFFERENTIAL (CANCER CENTER ONLY)
Abs Immature Granulocytes: 0.05 10*3/uL (ref 0.00–0.07)
Basophils Absolute: 0.1 10*3/uL (ref 0.0–0.1)
Basophils Relative: 1 %
Eosinophils Absolute: 0.2 10*3/uL (ref 0.0–0.5)
Eosinophils Relative: 3 %
HCT: 40.2 % (ref 39.0–52.0)
Hemoglobin: 13.7 g/dL (ref 13.0–17.0)
Immature Granulocytes: 1 %
Lymphocytes Relative: 17 %
Lymphs Abs: 1.1 10*3/uL (ref 0.7–4.0)
MCH: 30 pg (ref 26.0–34.0)
MCHC: 34.1 g/dL (ref 30.0–36.0)
MCV: 88 fL (ref 80.0–100.0)
Monocytes Absolute: 0.7 10*3/uL (ref 0.1–1.0)
Monocytes Relative: 10 %
Neutro Abs: 4.7 10*3/uL (ref 1.7–7.7)
Neutrophils Relative %: 68 %
Platelet Count: 320 10*3/uL (ref 150–400)
RBC: 4.57 MIL/uL (ref 4.22–5.81)
RDW: 12.6 % (ref 11.5–15.5)
WBC Count: 6.8 10*3/uL (ref 4.0–10.5)
nRBC: 0 % (ref 0.0–0.2)

## 2021-11-02 LAB — CMP (CANCER CENTER ONLY)
ALT: 15 U/L (ref 0–44)
AST: 17 U/L (ref 15–41)
Albumin: 4 g/dL (ref 3.5–5.0)
Alkaline Phosphatase: 100 U/L (ref 38–126)
Anion gap: 9 (ref 5–15)
BUN: 16 mg/dL (ref 6–20)
CO2: 26 mmol/L (ref 22–32)
Calcium: 9.3 mg/dL (ref 8.9–10.3)
Chloride: 104 mmol/L (ref 98–111)
Creatinine: 1.02 mg/dL (ref 0.61–1.24)
GFR, Estimated: >60 mL/min (ref >60)
Glucose, Bld: 103 mg/dL — ABNORMAL HIGH (ref 70–99)
Potassium: 3.7 mmol/L (ref 3.5–5.1)
Sodium: 139 mmol/L (ref 135–145)
Total Bilirubin: 0.6 mg/dL (ref 0.3–1.2)
Total Protein: 8 g/dL (ref 6.5–8.1)

## 2021-11-02 NOTE — Progress Notes (Signed)
Symptom Management Consult note [place]    Patient Care Team: [name], DO as PCP - General (Family Medicine) Silverio Decamp, MD as Consulting Physician (Sports Medicine) Ceasar Mons, MD as Consulting Physician (Urology) Wyatt Portela, MD as Consulting Physician (Oncology)    Name of the patient: Gordon Mcclain  [id number]  21-Mar-1976   Date of visit: 11/02/2021    Chief complaint/ Reason for visit- dizziness  Oncology History  Metastatic renal cell carcinoma to bone [facility])  05/09/2021 Initial Diagnosis   Metastatic renal cell carcinoma to bone [facility])   05/09/2021 Cancer Staging   Staging form: Kidney, AJCC 8th Edition - Clinical: Stage IV (cTX, cNX, cM1) - Signed by Wyatt Portela, MD on 05/09/2021   05/18/2021 -  Chemotherapy   Patient is on Treatment Plan : RENAL CELL CARCINOMA Nivolumab + Ipilimumab q21d / Nivolumab q14d       Current Therapy: Opdivo  Last treatment:      10/27/21  Interval history- Gordon Mcclain is a 46 y.o. with oncologic history as above presenting to [facility] today with chief complaint of intermittent dizziness x 5 days.  Patient states the day symptoms started he had been working on an ATV in the garage.  He estimates that he was laying underneath the ATV for at least 3 hours.  He sat up abruptly and felt dizzy which caused him to fall back and hit his head on the concrete floor.  He had no loss of consciousness.  He states when he stood up he felt a room spinning sensation and felt unsteady on his feet.  He states he also felt nauseous and had decreased appetite for the rest of that day.  He did not take any medication that day for his symptoms.  He states ever since then he has had intermittent dizziness and difficulty focusing his eyes.  He does wear glasses as he is nearsighted.  He denies any blurry vision.  He admits to feeling fatigued which typically happens after his treatments.  His dizziness  is worse when changing positions.  He states this morning it was very mild and has not been as frequent throughout the day.  He had a mild headache yesterday which resolved after taking Tylenol.  His spouse accompanies him and provides additional history.  She denies any periods of confusion or change in mental status.  He has been acting like his usual self.  Patient denies any alcohol intake.  He denies any neck pain, numbness, tingling or weakness.  Patient is not anticoagulated.    ROS  All other systems are reviewed and are negative for acute change except as noted in the HPI.    Allergies  Allergen Reactions   Opdivo [Nivolumab] Other (See Comments)    Acute lower back pain - see progress note from 07/20/2021 and 09/29/21 Patient able to complete infusion with additional fluids run concurrently.      Past Medical History:  Diagnosis Date   Frequent headaches    occipital region mostly: takes ibuprofen 800 mg bid most days   Palpitations    Zio patch reassuring 04/2021   Renal cancer ([place]) 03/2021   Left->presented with bone met to L rib.   Tobacco dependence      Past Surgical History:  Procedure Laterality Date   PARTIAL HIP ARTHROPLASTY Left    PILONIDAL CYST EXCISION  approx 2002   ROBOT ASSISTED LAPAROSCOPIC NEPHRECTOMY Left 08/02/2021   Procedure:  XI ROBOTIC ASSISTED LAPAROSCOPIC RADICAL NEPHRECTOMY;  Surgeon: Ceasar Mons, MD;  Location: [facility];  Service: Urology;  Laterality: Left;   WISDOM TOOTH EXTRACTION     zio patch     04/2021 zio reassuring.    Social History   Socioeconomic History   Marital status: Married    Spouse name: Not on file   Number of children: Not on file   Years of education: Not on file   Highest education level: Not on file  Occupational History   Not on file  Tobacco Use   Smoking status: Former    Packs/day: 1.00    Years: 20.00    Total pack years: 20.00    Types: Cigarettes    Quit date: 04/27/2021    Years since  quitting: 0.5    Passive exposure: Never   Smokeless tobacco: Never  Vaping Use   Vaping Use: Never used  Substance and Sexual Activity   Alcohol use: No   Drug use: No   Sexual activity: Yes    Partners: Female  Other Topics Concern   Not on file  Social History Narrative   Married, 1 daughter and one son.   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   Orig from [place], [place] area.   Education: HS.   Tob 20  pck-yr hx (current as of 03/2014).   Alc: none.  No hx of drug use.   Caffeine: 3-4 cups coffee/day, 1 cup tea per day.   No exercise.   Social Determinants of Health   Financial Resource Strain: Not on file  Food Insecurity: Not on file  Transportation Needs: Not on file  Physical Activity: Not on file  Stress: Not on file  Social Connections: Not on file  Intimate Partner Violence: Not on file    Family History  Problem Relation Age of Onset   Arthritis Mother    Cancer Maternal Aunt    Heart attack Maternal Uncle    Heart attack Maternal Grandmother    Heart attack Maternal Grandfather    Heart attack Maternal Aunt      Current Outpatient Medications:    acetaminophen (TYLENOL) 500 MG tablet, Take 1,000 mg by mouth every 6 (six) hours as needed for moderate pain. (Patient not taking: Reported on 10/03/2021), Disp: , Rfl:    diphenoxylate-atropine (LOMOTIL) 2.5-0.025 MG tablet, Take 1 tablet by mouth 4 (four) times daily as needed for diarrhea or loose stools. (Patient not taking: Reported on 07/18/2021), Disp: 30 tablet, Rfl: 0   docusate sodium (COLACE) 100 MG capsule, Take 1 capsule (100 mg total) by mouth 2 (two) times daily. (Patient not taking: Reported on 10/03/2021), Disp: , Rfl:    HYDROcodone-acetaminophen (NORCO) 5-325 MG tablet, Take 1-2 tablets by mouth every 6 (six) hours as needed for moderate pain or severe pain. (Patient not taking: Reported on 10/03/2021), Disp: 20 tablet, Rfl: 0   hydrOXYzine (ATARAX) 10 MG  tablet, Take 1 tablet (10 mg total) by mouth 3 (three) times daily as needed. (Patient not taking: Reported on 10/03/2021), Disp: 30 tablet, Rfl: 0   prochlorperazine (COMPAZINE) 10 MG tablet, Take 1 tablet (10 mg total) by mouth every 6 (six) hours as needed for nausea or vomiting. (Patient not taking: Reported on 07/18/2021), Disp: 30 tablet, Rfl: 0   promethazine (PHENERGAN) 12.5 MG tablet, Take 1 tablet (12.5 mg total) by mouth every 4 (four) hours as needed for nausea or vomiting. (Patient not taking: Reported on 10/03/2021), Disp: 15 tablet,  Rfl: 0   triamcinolone cream (KENALOG) 0.1 %, Apply 1 application. topically 2 (two) times daily. (Patient not taking: Reported on 10/03/2021), Disp: 453.6 g, Rfl: 1  PHYSICAL EXAM: ECOG FS:1 - Symptomatic but completely ambulatory    Vitals:   11/02/21 1338  BP: 130/83  Pulse: 81  Resp: 16  Temp: 98.8 F (37.1 C)  TempSrc: Oral  SpO2: 99%  Weight: 179 lb 1.6 oz (81.2 kg)   Physical Exam Vitals and nursing note reviewed.  Constitutional:      Appearance: He is well-developed. He is not ill-appearing or toxic-appearing.  HENT:     Head: Normocephalic and atraumatic.     Comments: No tenderness to palpation of skull. No deformities or crepitus noted. No open wounds, abrasions or lacerations.    Nose: Nose normal.  Eyes:     General: No scleral icterus.       Right eye: No discharge.        Left eye: No discharge.     Extraocular Movements: Extraocular movements intact.     Conjunctiva/sclera: Conjunctivae normal.     Pupils: Pupils are equal, round, and reactive to light.     Comments: No nystagmus  Neck:     Vascular: No JVD.  Cardiovascular:     Rate and Rhythm: Normal rate and regular rhythm.     Pulses: Normal pulses.     Heart sounds: Normal heart sounds.  Pulmonary:     Effort: Pulmonary effort is normal.     Breath sounds: Normal breath sounds.  Abdominal:     General: There is no distension.  Musculoskeletal:         General: Normal range of motion.     Cervical back: Normal range of motion.  Skin:    General: Skin is warm and dry.  Neurological:     Mental Status: He is oriented to person, place, and time.     GCS: GCS eye subscore is 4. GCS verbal subscore is 5. GCS motor subscore is 6.     Comments: Mental Status:  Alert, oriented, thought content appropriate, able to give a coherent history. Speech fluent without evidence of aphasia. Able to follow 2 step commands without difficulty.  Cranial Nerves:  II:  Peripheral visual fields grossly normal, pupils equal, round, reactive to light III,IV, VI: ptosis not present, extra-ocular motions intact bilaterally  V,VII: smile symmetric, facial light touch sensation equal VIII: hearing grossly normal to voice  X: uvula elevates symmetrically  XI: bilateral shoulder shrug symmetric and strong XII: midline tongue extension without fassiculations Motor:  Normal tone. 5/5 in upper and lower extremities bilaterally including strong and equal grip strength and dorsiflexion/plantar flexion Sensory: Pinprick and light touch normal in all extremities.  Cerebellar: normal finger-to-nose with bilateral upper extremities Gait: normal gait and balance CV: distal pulses palpable throughout    Psychiatric:        Behavior: Behavior normal.        LABORATORY DATA: I have reviewed the data as listed    Latest Ref Rng & Units 11/02/2021    1:42 PM 10/27/2021    8:09 AM 09/29/2021    7:59 AM  CBC  WBC 4.0 - 10.5 K/uL 6.8  5.6  4.6   Hemoglobin 13.0 - 17.0 g/dL 13.7  14.0  13.8   Hematocrit 39.0 - 52.0 % 40.2  41.1  41.3   Platelets 150 - 400 K/uL 320  254  226  Latest Ref Rng & Units 11/02/2021    1:42 PM 10/27/2021    8:09 AM 09/29/2021    7:59 AM  CMP  Glucose 70 - 99 mg/dL 103  134  116   BUN 6 - 20 mg/dL $Remove'16  18  19   'VkxfCvl$ Creatinine 0.61 - 1.24 mg/dL 1.02  1.08  1.13   Sodium 135 - 145 mmol/L 139  137  138   Potassium 3.5 - 5.1 mmol/L 3.7  3.8  3.8    Chloride 98 - 111 mmol/L 104  104  104   CO2 22 - 32 mmol/L $RemoveB'26  26  27   'GrilftiA$ Calcium 8.9 - 10.3 mg/dL 9.3  9.2  9.8   Total Protein 6.5 - 8.1 g/dL 8.0  7.2  7.1   Total Bilirubin 0.3 - 1.2 mg/dL 0.6  0.6  0.6   Alkaline Phos 38 - 126 U/L 100  101  80   AST 15 - 41 U/L $Remo'17  16  17   'SVPlY$ ALT 0 - 44 U/L $Remo'15  10  14        'axkNL$ RADIOGRAPHIC STUDIES (from last 24 hours if applicable) I have personally reviewed the radiological images as listed and agreed with the findings in the report. No results found.     ASSESSMENT & PLAN: Patient is a 46 y.o. male  with oncologic history of  Metastatic renal cell carcinoma to bone Stage IV followed by Dr. Alen Blew.  I have viewed most recent oncology note and lab work.   #)Dizziness -Patient has a normal neuroexam.  Chart review shows patient had MR brain 07/27/21 no evidence of metastatic disease. -Patient symptoms are suggestive of concussion.  He had been laying flat for several hours working outside in the heat and then sat up abruptly which caused him to feel dizzy and he fell backwards hitting his head on the ground.  Since then he has had intermittent concussive type symptoms.  I engaged in shared decision making with patient and his spouse.  They are agreeable with plan to continue to monitor symptoms and hold off on any interventions including head imaging at this time.  Discussed symptomatic management. -CBC and CMP overall unremarkable.  TSH is still in process.  If abnormal I will call and notify patient. -Strict ED precautions discussed should symptoms worsen.   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   Visit Diagnosis: 1. Metastatic renal cell carcinoma to bone (HCC)   2. Injury of head, initial encounter      No orders of the defined types were placed in this encounter.   All questions were answered. The patient knows to call the clinic with any problems, questions or concerns. No  barriers to learning was detected.  I have spent a total of 20 minutes minutes of face-to-face and non-face-to-face time, preparing to see the patient, obtaining and/or reviewing separately obtained history, performing a medically appropriate examination, counseling and educating the patient, ordering tests, documenting clinical information in the electronic health record, and care coordination (communications with other health care professionals or caregivers).    Thank you for allowing me to participate in the care of this patient.    Barrie Folk, PA-C Department of Hematology/Oncology [facility] at [facility] Phone: [phone]  [phone]    11/02/2021 4:59 PM

## 2021-11-03 LAB — TSH: TSH: 1.195 u[IU]/mL (ref 0.350–4.500)

## 2021-11-06 ENCOUNTER — Other Ambulatory Visit: Payer: Self-pay | Admitting: Oncology

## 2021-11-06 DIAGNOSIS — C649 Malignant neoplasm of unspecified kidney, except renal pelvis: Secondary | ICD-10-CM

## 2021-11-07 ENCOUNTER — Telehealth: Payer: Self-pay | Admitting: Oncology

## 2021-11-07 NOTE — Telephone Encounter (Signed)
Scheduled per 08/03 los, patient has been called and notified. 

## 2021-11-09 ENCOUNTER — Encounter: Payer: Self-pay | Admitting: Oncology

## 2021-11-09 ENCOUNTER — Telehealth: Payer: Self-pay | Admitting: *Deleted

## 2021-11-09 NOTE — Telephone Encounter (Signed)
PC to patient, informed him Dr Alen Blew does not think his pain is related to his cancer due to his recent scan results.  Patient states he has been up walking around & his pain actually feels some better.  Instructed patient to monitor the pain & if it becomes worse to call this office, he may be able to have an appointment in [facility], or he can go to [facility] or ED. Patient verbalizes understanding.

## 2021-11-24 ENCOUNTER — Ambulatory Visit: Payer: 59 | Admitting: Oncology

## 2021-11-24 ENCOUNTER — Other Ambulatory Visit: Payer: 59

## 2021-11-24 ENCOUNTER — Ambulatory Visit: Payer: 59

## 2021-12-01 ENCOUNTER — Inpatient Hospital Stay (HOSPITAL_BASED_OUTPATIENT_CLINIC_OR_DEPARTMENT_OTHER): Payer: 59 | Admitting: Oncology

## 2021-12-01 ENCOUNTER — Inpatient Hospital Stay: Payer: 59 | Attending: Physician Assistant

## 2021-12-01 ENCOUNTER — Inpatient Hospital Stay: Payer: 59

## 2021-12-01 ENCOUNTER — Other Ambulatory Visit: Payer: Self-pay

## 2021-12-01 VITALS — BP 111/71 | HR 81 | Temp 97.8°F | Resp 17 | Ht 65.0 in | Wt 177.6 lb

## 2021-12-01 DIAGNOSIS — R059 Cough, unspecified: Secondary | ICD-10-CM | POA: Diagnosis not present

## 2021-12-01 DIAGNOSIS — R0981 Nasal congestion: Secondary | ICD-10-CM | POA: Diagnosis not present

## 2021-12-01 DIAGNOSIS — C642 Malignant neoplasm of left kidney, except renal pelvis: Secondary | ICD-10-CM | POA: Insufficient documentation

## 2021-12-01 DIAGNOSIS — C7951 Secondary malignant neoplasm of bone: Secondary | ICD-10-CM

## 2021-12-01 DIAGNOSIS — Z79899 Other long term (current) drug therapy: Secondary | ICD-10-CM | POA: Diagnosis not present

## 2021-12-01 DIAGNOSIS — C649 Malignant neoplasm of unspecified kidney, except renal pelvis: Secondary | ICD-10-CM | POA: Diagnosis not present

## 2021-12-01 LAB — CMP (CANCER CENTER ONLY)
ALT: 11 U/L (ref 0–44)
AST: 12 U/L — ABNORMAL LOW (ref 15–41)
Albumin: 3.7 g/dL (ref 3.5–5.0)
Alkaline Phosphatase: 109 U/L (ref 38–126)
Anion gap: 7 (ref 5–15)
BUN: 16 mg/dL (ref 6–20)
CO2: 27 mmol/L (ref 22–32)
Calcium: 9.3 mg/dL (ref 8.9–10.3)
Chloride: 104 mmol/L (ref 98–111)
Creatinine: 1.02 mg/dL (ref 0.61–1.24)
GFR, Estimated: >60 mL/min (ref >60)
Glucose, Bld: 106 mg/dL — ABNORMAL HIGH (ref 70–99)
Potassium: 3.7 mmol/L (ref 3.5–5.1)
Sodium: 138 mmol/L (ref 135–145)
Total Bilirubin: 0.5 mg/dL (ref 0.3–1.2)
Total Protein: 7.2 g/dL (ref 6.5–8.1)

## 2021-12-01 LAB — CBC WITH DIFFERENTIAL (CANCER CENTER ONLY)
Abs Immature Granulocytes: 0.06 10*3/uL (ref 0.00–0.07)
Basophils Absolute: 0.1 10*3/uL (ref 0.0–0.1)
Basophils Relative: 1 %
Eosinophils Absolute: 0.2 10*3/uL (ref 0.0–0.5)
Eosinophils Relative: 3 %
HCT: 37.6 % — ABNORMAL LOW (ref 39.0–52.0)
Hemoglobin: 12.6 g/dL — ABNORMAL LOW (ref 13.0–17.0)
Immature Granulocytes: 1 %
Lymphocytes Relative: 10 %
Lymphs Abs: 0.9 10*3/uL (ref 0.7–4.0)
MCH: 29.6 pg (ref 26.0–34.0)
MCHC: 33.5 g/dL (ref 30.0–36.0)
MCV: 88.3 fL (ref 80.0–100.0)
Monocytes Absolute: 0.7 10*3/uL (ref 0.1–1.0)
Monocytes Relative: 8 %
Neutro Abs: 6.3 10*3/uL (ref 1.7–7.7)
Neutrophils Relative %: 77 %
Platelet Count: 369 10*3/uL (ref 150–400)
RBC: 4.26 MIL/uL (ref 4.22–5.81)
RDW: 12.3 % (ref 11.5–15.5)
WBC Count: 8.2 10*3/uL (ref 4.0–10.5)
nRBC: 0 % (ref 0.0–0.2)

## 2021-12-01 LAB — TSH: TSH: 0.913 u[IU]/mL (ref 0.350–4.500)

## 2021-12-01 MED ORDER — GUAIFENESIN ER 600 MG PO TB12: 600.0000 mg | ORAL_TABLET | Freq: Two times a day (BID) | ORAL | 0 refills | Status: DC

## 2021-12-01 NOTE — Progress Notes (Signed)
Hematology and Oncology Follow Up Visit  Gordon Mcclain [id number] [birth year] 46 y.o. 12/01/2021 8:08 AM Gordon Mcclain, Gordon Mcclain, [place], DO   Principle Diagnosis: 46 year old man with stage IV intermediate risk clear-cell renal cell carcinoma with disease to the bone and peritoneal adenopathy diagnosed in January 2023.   Prior Therapy:  He is status post left hip surgery completed in March 2023.  He underwent left hemiarthroplasty at that time.  Palliative radiation therapy to the left ribs with total of 30 Gray in 10 fractions completed in February 2023.  Ipilimumab 1 mg/kg and nivolumab 3 mg/kg started on May 18, 2021.  He completed 4 cycles of therapy on July 20, 2021.  Radiation therapy to the left hip for a total of 30 Gray in 10 fractions completed in May 2023.  He is status post robotic assisted left radical nephrectomy completed on Aug 02, 2021.  The final pathology showed predominantly extensive necrosis with 10% of the tumor cells viable indicating T1b.   Current therapy: Nivolumab 480 mg monthly maintenance started on Aug 24, 2021.  He is here for the next cycle of therapy.     Interim History: Gordon Mcclain returns today for repeat evaluation.  Since last visit, he reports slight increase and dry nonproductive cough.  He has reported some occasional exertional dyspnea but no chest pain or palpitation.  Denies any nausea vomiting or abdominal pain.  He denies any fevers or chills.  He has reported some nasal congestion and occasional postnasal drip.   Medications: Reviewed without changes. Current Outpatient Medications  Medication Sig Dispense Refill   acetaminophen (TYLENOL) 500 MG tablet Take 1,000 mg by mouth every 6 (six) hours as needed for moderate pain. (Patient not taking: Reported on 10/03/2021)     diphenoxylate-atropine (LOMOTIL) 2.5-0.025 MG tablet Take 1 tablet by mouth 4 (four) times daily as needed for diarrhea or loose stools. (Patient not taking:  Reported on 07/18/2021) 30 tablet 0   docusate sodium (COLACE) 100 MG capsule Take 1 capsule (100 mg total) by mouth 2 (two) times daily. (Patient not taking: Reported on 10/03/2021)     HYDROcodone-acetaminophen (NORCO) 5-325 MG tablet Take 1-2 tablets by mouth every 6 (six) hours as needed for moderate pain or severe pain. (Patient not taking: Reported on 10/03/2021) 20 tablet 0   hydrOXYzine (ATARAX) 10 MG tablet Take 1 tablet (10 mg total) by mouth 3 (three) times daily as needed. (Patient not taking: Reported on 10/03/2021) 30 tablet 0   prochlorperazine (COMPAZINE) 10 MG tablet Take 1 tablet (10 mg total) by mouth every 6 (six) hours as needed for nausea or vomiting. (Patient not taking: Reported on 07/18/2021) 30 tablet 0   promethazine (PHENERGAN) 12.5 MG tablet Take 1 tablet (12.5 mg total) by mouth every 4 (four) hours as needed for nausea or vomiting. (Patient not taking: Reported on 10/03/2021) 15 tablet 0   triamcinolone cream (KENALOG) 0.1 % Apply 1 application. topically 2 (two) times daily. (Patient not taking: Reported on 10/03/2021) 453.6 g 1   No current facility-administered medications for this visit.     Allergies:  Allergies  Allergen Reactions   Opdivo [Nivolumab] Other (See Comments)    Acute lower back pain - see progress note from 07/20/2021 and 09/29/21 Patient able to complete infusion with additional fluids run concurrently.      Physical Exam:   Blood pressure 111/71, pulse 81, temperature 97.8 F (36.6 C), temperature source Temporal, resp. rate 17, height '5\' 5"'$  (1.651 m), weight 177  lb 9.6 oz (80.6 kg), SpO2 98 %.     ECOG: 1   General appearance: Comfortable appearing without any discomfort Head: Normocephalic without any trauma Oropharynx: Mucous membranes are moist and pink without any thrush or ulcers. Eyes: Pupils are equal and round reactive to light. Lymph nodes: No cervical, supraclavicular, inguinal or axillary lymphadenopathy.   Heart:regular  rate and rhythm.  S1 and S2 without leg edema. Lung: Clear without any rhonchi or wheezes.  No dullness to percussion. Abdomin: Soft, nontender, nondistended with good bowel sounds.  No hepatosplenomegaly. Musculoskeletal: No joint deformity or effusion.  Full range of motion noted. Neurological: No deficits noted on motor, sensory and deep tendon reflex exam. Skin: No petechial rash or dryness.  Appeared moist.      Lab Results: Lab Results  Component Value Date   WBC 6.8 11/02/2021   HGB 13.7 11/02/2021   HCT 40.2 11/02/2021   MCV 88.0 11/02/2021   PLT 320 11/02/2021     Chemistry      Component Value Date/Time   NA 139 11/02/2021 1342   K 3.7 11/02/2021 1342   CL 104 11/02/2021 1342   CO2 26 11/02/2021 1342   BUN 16 11/02/2021 1342   CREATININE 1.02 11/02/2021 1342      Component Value Date/Time   CALCIUM 9.3 11/02/2021 1342   ALKPHOS 100 11/02/2021 1342   AST 17 11/02/2021 1342   ALT 15 11/02/2021 1342   BILITOT 0.6 11/02/2021 1342          Impression and Plan:   84 year old with:  1.  Kidney cancer diagnosed in January 2023.  He presented with stage IV intermediate risk clear-cell with metastasis to the ribs, left hip and subcutaneous nodules.   The natural course of his disease was reviewed at this time and treatment choices were discussed.  He has tolerated nivolumab maintenance without any complications.  Risks and benefits of continuing this treatment were discussed and we have elected to hold treatment for 1 month.  He has complaints are predominantly related to postnasal drip although pneumonitis could not be ruled out.  Treatment break is preferred at this time.  We will resume in 4 weeks and we will update his staging scans in 3 months.   2.  IV access: Port-A-Cath option has been deferred at this time.   3.  Antiemetics: No nausea or vomiting reported at this time Compazine is available to him.  4.  Left hip metastasis: No exacerbation noted after  his hip surgery.  His pain is manageable.      5.  Immunotherapy surveillance: He has not experienced any complications including pneumonitis, colitis and thyroid disease.  I doubt he is developing pneumonitis but will continue to monitor clinically.   6.  CNS screening: No notes of metastatic disease noted based on imaging studies in May 2023.  7.  Prognosis and goals of care: Aggressive measures are warranted with his disease and treatment complete response.  8.  Cough and congestion: Prescription for prescription decongestion and cough suppressant will be available to him.  9.  Follow-up: In 4 weeks for the next cycle of therapy.     30  minutes were dedicated to this encounter.  The time was spent on reviewing disease status, treatment choices and addressing complication related to his cancer and cancer therapy.   Zola Button, MD 9/7/20238:08 AM

## 2021-12-05 ENCOUNTER — Other Ambulatory Visit: Payer: Self-pay

## 2021-12-05 ENCOUNTER — Inpatient Hospital Stay: Payer: 59 | Admitting: Licensed Clinical Social Worker

## 2021-12-05 NOTE — Progress Notes (Signed)
[place] Social Work  Patient presented to [facility]  to review and complete healthcare advance directives.  Clinical Social Worker met with patient and pt's wife, Gordon Mcclain.  The patient designated [name] (wife) as their primary healthcare agent and did not assign a secondary agent.  Patient also completed healthcare living will.    Documents were notarized and copies made for patient/family. Clinical Social Worker will send documents to medical records to be scanned into patient's chart. Clinical Social Worker encouraged patient/family to contact with any additional questions or concerns.   [place], [place] Worker [employer]

## 2021-12-27 ENCOUNTER — Emergency Department (HOSPITAL_COMMUNITY): Payer: 59

## 2021-12-27 ENCOUNTER — Other Ambulatory Visit: Payer: Self-pay

## 2021-12-27 ENCOUNTER — Observation Stay (HOSPITAL_COMMUNITY)
Admission: EM | Admit: 2021-12-27 | Discharge: 2021-12-28 | Disposition: A | Discharge status: Home / Self Care | Payer: 59 | Attending: Internal Medicine | Admitting: Internal Medicine

## 2021-12-27 ENCOUNTER — Encounter (HOSPITAL_COMMUNITY): Payer: Self-pay

## 2021-12-27 DIAGNOSIS — Z87891 Personal history of nicotine dependence: Secondary | ICD-10-CM | POA: Diagnosis not present

## 2021-12-27 DIAGNOSIS — R079 Chest pain, unspecified: Secondary | ICD-10-CM | POA: Diagnosis present

## 2021-12-27 DIAGNOSIS — C642 Malignant neoplasm of left kidney, except renal pelvis: Secondary | ICD-10-CM | POA: Diagnosis not present

## 2021-12-27 DIAGNOSIS — J189 Pneumonia, unspecified organism: Principal | ICD-10-CM

## 2021-12-27 DIAGNOSIS — C7951 Secondary malignant neoplasm of bone: Secondary | ICD-10-CM | POA: Diagnosis not present

## 2021-12-27 DIAGNOSIS — Z20822 Contact with and (suspected) exposure to covid-19: Secondary | ICD-10-CM | POA: Insufficient documentation

## 2021-12-27 DIAGNOSIS — Z96642 Presence of left artificial hip joint: Secondary | ICD-10-CM | POA: Diagnosis not present

## 2021-12-27 DIAGNOSIS — Z85528 Personal history of other malignant neoplasm of kidney: Secondary | ICD-10-CM

## 2021-12-27 DIAGNOSIS — D649 Anemia, unspecified: Secondary | ICD-10-CM | POA: Insufficient documentation

## 2021-12-27 DIAGNOSIS — C649 Malignant neoplasm of unspecified kidney, except renal pelvis: Secondary | ICD-10-CM | POA: Diagnosis not present

## 2021-12-27 DIAGNOSIS — Z905 Acquired absence of kidney: Secondary | ICD-10-CM

## 2021-12-27 DIAGNOSIS — Z79899 Other long term (current) drug therapy: Secondary | ICD-10-CM | POA: Insufficient documentation

## 2021-12-27 LAB — HEPATIC FUNCTION PANEL
ALT: 19 U/L (ref 0–44)
AST: 19 U/L (ref 15–41)
Albumin: 3.2 g/dL — ABNORMAL LOW (ref 3.5–5.0)
Alkaline Phosphatase: 117 U/L (ref 38–126)
Bilirubin, Direct: 0.1 mg/dL (ref 0.0–0.2)
Indirect Bilirubin: 0.5 mg/dL (ref 0.3–0.9)
Total Bilirubin: 0.6 mg/dL (ref 0.3–1.2)
Total Protein: 7.1 g/dL (ref 6.5–8.1)

## 2021-12-27 LAB — BASIC METABOLIC PANEL
Anion gap: 6 (ref 5–15)
BUN: 18 mg/dL (ref 6–20)
CO2: 25 mmol/L (ref 22–32)
Calcium: 8.9 mg/dL (ref 8.9–10.3)
Chloride: 104 mmol/L (ref 98–111)
Creatinine, Ser: 0.99 mg/dL (ref 0.61–1.24)
GFR, Estimated: >60 mL/min (ref >60)
Glucose, Bld: 105 mg/dL — ABNORMAL HIGH (ref 70–99)
Potassium: 4.3 mmol/L (ref 3.5–5.1)
Sodium: 135 mmol/L (ref 135–145)

## 2021-12-27 LAB — CBC
HCT: 38.9 % — ABNORMAL LOW (ref 39.0–52.0)
Hemoglobin: 12.5 g/dL — ABNORMAL LOW (ref 13.0–17.0)
MCH: 28.7 pg (ref 26.0–34.0)
MCHC: 32.1 g/dL (ref 30.0–36.0)
MCV: 89.2 fL (ref 80.0–100.0)
Platelets: 345 10*3/uL (ref 150–400)
RBC: 4.36 MIL/uL (ref 4.22–5.81)
RDW: 12.4 % (ref 11.5–15.5)
WBC: 11.7 10*3/uL — ABNORMAL HIGH (ref 4.0–10.5)
nRBC: 0 % (ref 0.0–0.2)

## 2021-12-27 LAB — SEDIMENTATION RATE: Sed Rate: 47 mm/hr — ABNORMAL HIGH (ref 0–16)

## 2021-12-27 LAB — TROPONIN I (HIGH SENSITIVITY)
Troponin I (High Sensitivity): 7 ng/L (ref <18)
Troponin I (High Sensitivity): 7 ng/L (ref <18)

## 2021-12-27 LAB — C-REACTIVE PROTEIN: CRP: 11.6 mg/dL — ABNORMAL HIGH (ref <1.0)

## 2021-12-27 LAB — RESP PANEL BY RT-PCR (FLU A&B, COVID) ARPGX2
Influenza A by PCR: NEGATIVE
Influenza B by PCR: NEGATIVE
SARS Coronavirus 2 by RT PCR: NEGATIVE

## 2021-12-27 LAB — PROCALCITONIN: Procalcitonin: <0.1 ng/mL

## 2021-12-27 LAB — HIV ANTIBODY (ROUTINE TESTING W REFLEX): HIV Screen 4th Generation wRfx: NONREACTIVE

## 2021-12-27 LAB — LIPASE, BLOOD: Lipase: 24 U/L (ref 11–51)

## 2021-12-27 LAB — STREP PNEUMONIAE URINARY ANTIGEN: Strep Pneumo Urinary Antigen: NEGATIVE

## 2021-12-27 MED ORDER — ONDANSETRON HCL 4 MG/2ML IJ SOLN
4.0000 mg | Freq: Once | INTRAMUSCULAR | Status: AC
  Administered 2021-12-27: 4 mg via INTRAVENOUS
  Filled 2021-12-27: qty 2

## 2021-12-27 MED ORDER — SODIUM CHLORIDE 0.9 % IV SOLN
500.0000 mg | Freq: Once | INTRAVENOUS | Status: AC
  Administered 2021-12-27: 500 mg via INTRAVENOUS
  Filled 2021-12-27: qty 5

## 2021-12-27 MED ORDER — SODIUM CHLORIDE 0.9 % IV SOLN
1.0000 g | Freq: Once | INTRAVENOUS | Status: AC
  Administered 2021-12-27: 1 g via INTRAVENOUS
  Filled 2021-12-27: qty 10

## 2021-12-27 MED ORDER — ALBUTEROL SULFATE (2.5 MG/3ML) 0.083% IN NEBU: 2.5000 mg | INHALATION_SOLUTION | RESPIRATORY_TRACT | Status: DC | PRN

## 2021-12-27 MED ORDER — HEPARIN SODIUM (PORCINE) 5000 UNIT/ML IJ SOLN
5000.0000 [IU] | Freq: Three times a day (TID) | INTRAMUSCULAR | Status: DC
  Administered 2021-12-27: 5000 [IU] via SUBCUTANEOUS

## 2021-12-27 MED ORDER — KETOROLAC TROMETHAMINE 15 MG/ML IJ SOLN
15.0000 mg | Freq: Four times a day (QID) | INTRAMUSCULAR | Status: DC
  Filled 2021-12-27: qty 1

## 2021-12-27 MED ORDER — KETOROLAC TROMETHAMINE 15 MG/ML IJ SOLN: 15.0000 mg | Freq: Once | INTRAMUSCULAR | Status: DC

## 2021-12-27 MED ORDER — LIDOCAINE 5 % EX PTCH
2.0000 | MEDICATED_PATCH | CUTANEOUS | Status: DC
  Administered 2021-12-27: 2 via TRANSDERMAL
  Filled 2021-12-27 (×2): qty 2

## 2021-12-27 MED ORDER — ACETAMINOPHEN 650 MG RE SUPP: 650.0000 mg | Freq: Four times a day (QID) | RECTAL | Status: DC | PRN

## 2021-12-27 MED ORDER — IOHEXOL 350 MG/ML SOLN
80.0000 mL | Freq: Once | INTRAVENOUS | Status: AC | PRN
  Administered 2021-12-27: 80 mL via INTRAVENOUS

## 2021-12-27 MED ORDER — HYDROMORPHONE HCL 1 MG/ML IJ SOLN
1.0000 mg | Freq: Once | INTRAMUSCULAR | Status: AC
  Administered 2021-12-27: 1 mg via INTRAVENOUS
  Filled 2021-12-27: qty 1

## 2021-12-27 MED ORDER — SODIUM CHLORIDE 0.9 % IV SOLN
2.0000 g | INTRAVENOUS | Status: DC
  Administered 2021-12-28: 2 g via INTRAVENOUS
  Filled 2021-12-27: qty 20

## 2021-12-27 MED ORDER — LACTATED RINGERS IV SOLN: INTRAVENOUS | Status: AC

## 2021-12-27 MED ORDER — ACETAMINOPHEN 325 MG PO TABS
650.0000 mg | ORAL_TABLET | Freq: Four times a day (QID) | ORAL | Status: DC | PRN
  Administered 2021-12-27: 650 mg via ORAL
  Filled 2021-12-27: qty 2

## 2021-12-27 MED ORDER — HYDROMORPHONE HCL 1 MG/ML IJ SOLN: 0.5000 mg | INTRAMUSCULAR | Status: DC | PRN

## 2021-12-27 MED ORDER — AZITHROMYCIN 250 MG PO TABS
500.0000 mg | ORAL_TABLET | Freq: Every day | ORAL | Status: DC
  Administered 2021-12-28: 500 mg via ORAL
  Filled 2021-12-27: qty 2

## 2021-12-27 MED ORDER — OXYCODONE HCL 5 MG PO TABS: 5.0000 mg | ORAL_TABLET | ORAL | Status: DC | PRN

## 2021-12-27 MED ORDER — SODIUM CHLORIDE (PF) 0.9 % IJ SOLN
INTRAMUSCULAR | Status: AC
  Filled 2021-12-27: qty 50

## 2021-12-27 MED ORDER — HYDROMORPHONE HCL 1 MG/ML IJ SOLN
0.5000 mg | Freq: Once | INTRAMUSCULAR | Status: AC
  Administered 2021-12-27: 0.5 mg via INTRAVENOUS
  Filled 2021-12-27: qty 1

## 2021-12-27 MED ORDER — SODIUM CHLORIDE 0.9 % IV BOLUS
1000.0000 mL | Freq: Once | INTRAVENOUS | Status: AC
  Administered 2021-12-27: 1000 mL via INTRAVENOUS

## 2021-12-27 NOTE — Consult Note (Signed)
NAME:  Gordon Mcclain, MRN:  [id number], DOB:  Mar 07, 1976, LOS: 0 ADMISSION DATE:  12/27/2021, CONSULTATION DATE:  10/3 REFERRING MD:  Dr. 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 He is currently being treated with nivolumab. The patient reports intermittent shortness of breath and nonproductive cough going back a couple of months.  He discussed the cough with his oncologist who felt it was likely secondary to chemotherapy.  CT of the chest in August of this year did show some consolidation in the left lower lobe.  He denies fevers, chills, productive cough during that time.  On 10/3 he awoke from sleep with severe right-sided chest pain causing him to present to [facility] Emergency Department. Imaging in the ED consistent with bilateral pneumonia with the left lower lobe in particular being densely consolidated. PCCM was consulted for further evaluation of LLL pneumonia.  Pertinent  Medical History   has a past medical history of Frequent headaches, Palpitations, Renal cancer ([place]) (03/2021), Renal mass (08/02/2021), Renal mass, left (04/20/2021), and Tobacco dependence.   Significant Hospital Events: Including procedures, antibiotic start and stop dates in addition to other pertinent events   10/3 Admit for PNA  Interim History / Subjective:  As above  Objective   Blood pressure (!) 101/57, pulse 64, temperature 97.8 F (36.6 C), temperature source Oral, resp. rate 19, height '5\' 5"'$  (1.651 m), weight 80.7 kg, SpO2 93 %.        Intake/Output Summary (Last 24 hours) at 12/27/2021 1040 Last data filed at 12/27/2021 0850 Gross per 24 hour  Intake 1345.19 ml  Output --  Net 1345.19 ml   Filed Weights   12/27/21 0207  Weight: 80.7 kg    Examination: General: Adult male of normal  body habitus in mild distress d/t pain HENT: [place]/AT, PERRL, no JVD Lungs: Diminished L base, Rhonchi R base. Tachypnea. R anterior chest distal to the pectoral is exquisitely tender to palpation.  Cardiovascular: RRR, no MRG Abdomen: Soft, non-tender, non-distended.  Extremities: No acute deformity or ROM limitation.  Neuro: Alert, oriented, non-focal.   Resolved Hospital Problem list     Assessment & Plan:   Bilateral pneumonia: community acquired in immunocompromised host. much more significant in the left lower lobe with dense consolidation. Probably infections, but could be an inflammatory response to chemotherapy or radiation. It was visible on CT scan in August of this year, although, much smaller. Could also be metastasis.  - Empiric antibiotics per primary - Incentive spirometry and flutter valve.  - Not hypoxic, but can give oxygen for work of breathing.  - Will need better pain management to allow for deep breathing. At significant risk of atelectasis.  - COVID, flu negative - Sputum culture if able - Lung biopsy would be beneficial if further evaluation/diagnosis. Have discussed options with Mr. Enriques and Dr. Erin Fulling will follow up.   Best Practice (right click and "Reselect all SmartList Selections" daily)   Diet/type: Regular consistency (see orders) DVT prophylaxis: other per primary GI prophylaxis: N/A Lines: N/A Foley:  N/A Code Status:  full code Last date of multidisciplinary goals of care discussion '[ ]'$   Labs   CBC: Recent Labs  Lab 12/27/21 0209  WBC 11.7*  HGB 12.5*  HCT 38.9*  MCV 89.2  PLT 345    Basic  Metabolic Panel: Recent Labs  Lab 12/27/21 0209  NA 135  K 4.3  CL 104  CO2 25  GLUCOSE 105*  BUN 18  CREATININE 0.99  CALCIUM 8.9   GFR: Estimated Creatinine Clearance: 90.3 mL/min (by C-G formula based on SCr of 0.99 mg/dL). Recent Labs  Lab 12/27/21 0209  WBC 11.7*    Liver Function Tests: Recent Labs  Lab 12/27/21 0338  AST  19  ALT 19  ALKPHOS 117  BILITOT 0.6  PROT 7.1  ALBUMIN 3.2*   Recent Labs  Lab 12/27/21 0338  LIPASE 24   No results for input(s): "AMMONIA" in the last 168 hours.  ABG No results found for: "PHART", "PCO2ART", "PO2ART", "HCO3", "TCO2", "ACIDBASEDEF", "O2SAT"   Coagulation Profile: No results for input(s): "INR", "PROTIME" in the last 168 hours.  Cardiac Enzymes: No results for input(s): "CKTOTAL", "CKMB", "CKMBINDEX", "TROPONINI" in the last 168 hours.  HbA1C: Hgb A1c MFr Bld  Date/Time Value Ref Range Status  04/08/2021 09:46 AM 5.4 4.6 - 6.5 % Final    Comment:    Glycemic Control Guidelines for People with Diabetes:Non Diabetic:  <6%Goal of Therapy: <7%Additional Action Suggested:  >8%     CBG: No results for input(s): "GLUCAP" in the last 168 hours.  Review of Systems:   Bolds are positive  Constitutional: weight loss, gain, night sweats, Fevers, chills, fatigue .  HEENT: headaches, Sore throat, sneezing, nasal congestion, post nasal drip, Difficulty swallowing, Tooth/dental problems, visual complaints visual changes, ear ache CV:  chest pain, radiates:,Orthopnea, PND, swelling in lower extremities, dizziness, palpitations, syncope.  GI  heartburn, indigestion, abdominal pain, nausea, vomiting, diarrhea, change in bowel habits, loss of appetite, bloody stools.  Resp: cough, productive: , hemoptysis, dyspnea, chest pain, pleuritic.  Skin: rash or itching or icterus GU: dysuria, change in color of urine, urgency or frequency. flank pain, hematuria  MS: joint pain or swelling. decreased range of motion.  Psych: change in mood or affect. depression or anxiety.  Neuro: difficulty with speech, weakness, numbness, ataxia    Past Medical History:  He,  has a past medical history of Frequent headaches, Palpitations, Renal cancer ([place]) (03/2021), Renal mass (08/02/2021), Renal mass, left (04/20/2021), and Tobacco dependence.   Surgical History:   Past Surgical History:   Procedure Laterality Date   PARTIAL HIP ARTHROPLASTY Left    PILONIDAL CYST EXCISION  approx 2002   ROBOT ASSISTED LAPAROSCOPIC NEPHRECTOMY Left 08/02/2021   Procedure: XI ROBOTIC ASSISTED LAPAROSCOPIC RADICAL NEPHRECTOMY;  Surgeon: Ceasar Mons, MD;  Location: [facility];  Service: Urology;  Laterality: Left;   WISDOM TOOTH EXTRACTION     zio patch     04/2021 zio reassuring.     Social History:   reports that he quit smoking about 8 months ago. His smoking use included cigarettes. He has a 20.00 pack-year smoking history. He has never been exposed to tobacco smoke. He has never used smokeless tobacco. He reports that he does not drink alcohol and does not use drugs.   Family History:  His family history includes Arthritis in his mother; Cancer in his maternal aunt; Heart attack in his maternal aunt, maternal grandfather, maternal grandmother, and maternal uncle.   Allergies Allergies  Allergen Reactions   Opdivo [Nivolumab] Other (See Comments)    Acute lower back pain - see progress note from 07/20/2021 and 09/29/21 Patient able to complete infusion with additional fluids run concurrently.      Home Medications  Prior to Admission medications  Medication Sig Start Date End Date Taking? Authorizing Provider  acetaminophen (TYLENOL) 500 MG tablet Take 1,000 mg by mouth every 6 (six) hours as needed for moderate pain or headache.   Yes [provider]  prochlorperazine (COMPAZINE) 10 MG tablet Take 1 tablet (10 mg total) by mouth every 6 (six) hours as needed for nausea or vomiting. Patient not taking: Reported on 07/18/2021 05/09/21   Wyatt Portela, MD     Critical care time:      Georgann Housekeeper, AGACNP-BC [place] for personal pager PCCM on call pager [phone] until 7pm. Please call Elink 7p-7a. [phone]  12/27/2021 11:18 AM

## 2021-12-27 NOTE — Assessment & Plan Note (Addendum)
Admit to observation medical bed.  Start antibiotics with Rocephin and Zithromax.  Check strep pneumo and Legionella antigen..  IV Dilaudid for severe pain.  And oxycodone for moderate pain.  Lidoderm patches on the right side for his what right chest wall pain.  Obtain blood cultures.

## 2021-12-27 NOTE — H&P (Signed)
History and Physical    Gordon Mcclain WKG:[id number] DOB: 1975/06/21 DOA: 12/27/2021  DOS: the patient was seen and examined on 12/27/2021  PCP: [name], DO   Patient coming from: Home  I have personally briefly reviewed patient's old medical records in [place]  CC: chest pain HPI: 46 yo WM with of metastatic renal cell cancer to bone, s/p left nephrectomy, s/p left THA, presents to ER with right sided chest pain. Awoke tonight with chest pain. SOB but no fevers. Progressive pain in his chest. Right sided chest to right neck/right arm.  Came to ER for evaluation.  Interestingly, pt states he has had a cough for about 2 months. Discussed this with his oncologist who thought pt's cough may be related to chemo therapy. Pt has not had any imaging for w/u of his cough.  Cough is nonproductive.  On arrival to ER, temp 99.1, HR 94, BP 119/56. 96% RA sats  Labs: WBC 11.7, HgB 12.5, plt 345  CTPA shows extensive LLL pneumonia.  TRH consulted for admission.   ED Course: Lab work surprisingly good.  White cell count 11.7.  CTPA negative for PE but shows extensive left lower lobe consolidation and pneumonia.  Review of Systems:  Review of Systems  Constitutional: Negative.  Negative for chills and fever.  HENT: Negative.    Respiratory:  Positive for cough and shortness of breath.   Cardiovascular:  Positive for chest pain.       Right-sided chest pain.  Radiation to his arm and neck.  Gastrointestinal: Negative.   Genitourinary: Negative.   Musculoskeletal:  Positive for back pain and neck pain.  Skin: Negative.   Neurological: Negative.   Endo/Heme/Allergies: Negative.   Psychiatric/Behavioral: Negative.    All other systems reviewed and are negative.   Past Medical History:  Diagnosis Date   Frequent headaches    occipital region mostly: takes ibuprofen 800 mg bid most days   Palpitations    Zio patch reassuring 04/2021   Renal cancer ([place]) 03/2021    Left->presented with bone met to L rib.   Renal mass 08/02/2021   Renal mass, left 04/20/2021   Tobacco dependence     Past Surgical History:  Procedure Laterality Date   PARTIAL HIP ARTHROPLASTY Left    PILONIDAL CYST EXCISION  approx 2002   ROBOT ASSISTED LAPAROSCOPIC NEPHRECTOMY Left 08/02/2021   Procedure: XI ROBOTIC ASSISTED LAPAROSCOPIC RADICAL NEPHRECTOMY;  Surgeon: Ceasar Mons, MD;  Location: [facility];  Service: Urology;  Laterality: Left;   WISDOM TOOTH EXTRACTION     zio patch     04/2021 zio reassuring.     reports that he quit smoking about 8 months ago. His smoking use included cigarettes. He has a 20.00 pack-year smoking history. He has never been exposed to tobacco smoke. He has never used smokeless tobacco. He reports that he does not drink alcohol and does not use drugs.  Allergies  Allergen Reactions   Opdivo [Nivolumab] Other (See Comments)    Acute lower back pain - see progress note from 07/20/2021 and 09/29/21 Patient able to complete infusion with additional fluids run concurrently.     Family History  Problem Relation Age of Onset   Arthritis Mother    Cancer Maternal Aunt    Heart attack Maternal Uncle    Heart attack Maternal Grandmother    Heart attack Maternal Grandfather    Heart attack Maternal Aunt     Prior to Admission medications  Medication Sig Start Date End Date Taking? Authorizing Provider  acetaminophen (TYLENOL) 500 MG tablet Take 1,000 mg by mouth every 6 (six) hours as needed for moderate pain or headache.   Yes [provider]  diphenoxylate-atropine (LOMOTIL) 2.5-0.025 MG tablet Take 1 tablet by mouth 4 (four) times daily as needed for diarrhea or loose stools. Patient not taking: Reported on 07/18/2021 05/23/21   Wyatt Portela, MD  docusate sodium (COLACE) 100 MG capsule Take 1 capsule (100 mg total) by mouth 2 (two) times daily. Patient not taking: Reported on 10/03/2021 08/02/21   Debbrah Alar, PA-C  guaiFENesin  (MUCINEX) 600 MG 12 hr tablet Take 1 tablet (600 mg total) by mouth 2 (two) times daily. Patient not taking: Reported on 12/27/2021 12/01/21   Wyatt Portela, MD  HYDROcodone-acetaminophen (NORCO) 5-325 MG tablet Take 1-2 tablets by mouth every 6 (six) hours as needed for moderate pain or severe pain. Patient not taking: Reported on 10/03/2021 08/02/21   Debbrah Alar, PA-C  hydrOXYzine (ATARAX) 10 MG tablet Take 1 tablet (10 mg total) by mouth 3 (three) times daily as needed. Patient not taking: Reported on 10/03/2021 06/29/21   Wyatt Portela, MD  prochlorperazine (COMPAZINE) 10 MG tablet Take 1 tablet (10 mg total) by mouth every 6 (six) hours as needed for nausea or vomiting. Patient not taking: Reported on 07/18/2021 05/09/21   Wyatt Portela, MD  promethazine (PHENERGAN) 12.5 MG tablet Take 1 tablet (12.5 mg total) by mouth every 4 (four) hours as needed for nausea or vomiting. Patient not taking: Reported on 10/03/2021 08/02/21   Debbrah Alar, PA-C  triamcinolone cream (KENALOG) 0.1 % Apply 1 application. topically 2 (two) times daily. Patient not taking: Reported on 10/03/2021 06/29/21   Wyatt Portela, MD    Physical Exam: Vitals:   12/27/21 0206 12/27/21 0207 12/27/21 0345 12/27/21 0445  BP: (!) 119/56  105/74 101/65  Pulse: 97  90 72  Resp: 18  (!) 25 20  Temp: 99.1 F (37.3 C)     TempSrc: Oral     SpO2: 97%  95% 95%  Weight:  80.7 kg    Height:  _0  (1.651 m)      Physical Exam Vitals and nursing note reviewed.  Constitutional:      General: He is not in acute distress.    Appearance: Normal appearance. He is not ill-appearing, toxic-appearing or diaphoretic.  HENT:     Head: Normocephalic and atraumatic.     Nose: Nose normal.  Eyes:     General: No scleral icterus. Cardiovascular:     Rate and Rhythm: Normal rate and regular rhythm.     Pulses: Normal pulses.  Pulmonary:     Breath sounds: Examination of the left-middle field reveals rales. Examination of the  right-lower field reveals rales. Examination of the left-lower field reveals rales. Rales present.  Abdominal:     General: Abdomen is flat. Bowel sounds are normal. There is no distension.     Palpations: Abdomen is soft.     Tenderness: There is no abdominal tenderness. There is no guarding or rebound.  Musculoskeletal:     Right lower leg: No edema.     Left lower leg: No edema.  Skin:    General: Skin is warm and dry.     Capillary Refill: Capillary refill takes less than 2 seconds.  Neurological:     General: No focal deficit present.     Mental Status: He is alert  and oriented to person, place, and time.      Labs on Admission: I have personally reviewed following labs and imaging studies  CBC: Recent Labs  Lab 12/27/21 0209  WBC 11.7*  HGB 12.5*  HCT 38.9*  MCV 89.2  PLT 740   Basic Metabolic Panel: Recent Labs  Lab 12/27/21 0209  NA 135  K 4.3  CL 104  CO2 25  GLUCOSE 105*  BUN 18  CREATININE 0.99  CALCIUM 8.9   GFR: Estimated Creatinine Clearance: 90.3 mL/min (by C-G formula based on SCr of 0.99 mg/dL). Liver Function Tests: Recent Labs  Lab 12/27/21 0338  AST 19  ALT 19  ALKPHOS 117  BILITOT 0.6  PROT 7.1  ALBUMIN 3.2*   Recent Labs  Lab 12/27/21 0338  LIPASE 24   No results for input(s): "AMMONIA" in the last 168 hours. Coagulation Profile: No results for input(s): "INR", "PROTIME" in the last 168 hours. Cardiac Enzymes: Recent Labs  Lab 12/27/21 0209  TROPONINIHS 7   BNP (last 3 results) No results for input(s): "PROBNP" in the last 8760 hours. HbA1C: No results for input(s): "HGBA1C" in the last 72 hours. CBG: No results for input(s): "GLUCAP" in the last 168 hours. Lipid Profile: No results for input(s): "CHOL", "HDL", "LDLCALC", "TRIG", "CHOLHDL", "LDLDIRECT" in the last 72 hours. Thyroid Function Tests: No results for input(s): "TSH", "T4TOTAL", "FREET4", "T3FREE", "THYROIDAB" in the last 72 hours. Anemia Panel: No  results for input(s): "VITAMINB12", "FOLATE", "FERRITIN", "TIBC", "IRON", "RETICCTPCT" in the last 72 hours. Urine analysis:    Component Value Date/Time   COLORURINE AMBER (A) 11/22/2020 0755   APPEARANCEUR HAZY (A) 11/22/2020 0755   LABSPEC 1.026 11/22/2020 0755   PHURINE 6.0 11/22/2020 0755   GLUCOSEU NEGATIVE 11/22/2020 0755   HGBUR NEGATIVE 11/22/2020 0755   BILIRUBINUR NEGATIVE 11/22/2020 0755   KETONESUR 5 (A) 11/22/2020 0755   PROTEINUR 30 (A) 11/22/2020 0755   NITRITE NEGATIVE 11/22/2020 0755   LEUKOCYTESUR NEGATIVE 11/22/2020 0755    Radiological Exams on Admission: I have personally reviewed images CT Angio Chest PE W and/or Wo Contrast  Result Date: 12/27/2021 CLINICAL DATA:  Sharp chest pain abnormal chest x-ray. EXAM: CT ANGIOGRAPHY CHEST WITH CONTRAST TECHNIQUE: Multidetector CT imaging of the chest was performed using the standard protocol during bolus administration of intravenous contrast. Multiplanar CT image reconstructions and MIPs were obtained to evaluate the vascular anatomy. RADIATION DOSE REDUCTION: This exam was performed according to the departmental dose-optimization program which includes automated exposure control, adjustment of the mA and/or kV according to patient size and/or use of iterative reconstruction technique. CONTRAST:  23m OMNIPAQUE IOHEXOL 350 MG/ML SOLN COMPARISON:  05/04/2021 FINDINGS: Cardiovascular: Satisfactory opacification of the pulmonary arteries to the segmental level. No evidence of pulmonary embolism. Normal heart size. No pericardial effusion. Mediastinum/Nodes: Nodule at the superior left mediastinum which is stable, density favoring thyroid. Lungs/Pleura: Consolidation in the lingula and especially left lower lobe. Milder right middle and lower lobe patchy airspace disease. No interlobular septal thickening, effusion, or pneumothorax. Upper Abdomen: Post treatment changes partially covered in the left upper quadrant. Musculoskeletal:  Stable appearance of known left seventh rib metastasis. Review of the MIP images confirms the above findings. IMPRESSION: 1. Bilateral pneumonia, extensive at the left lung base. 2. Negative for pulmonary embolism. Electronically Signed   By: JJorje GuildM.D.   On: 12/27/2021 04:48   DG Chest 2 View  Result Date: 12/27/2021 CLINICAL DATA:  Chest pain EXAM: CHEST - 2 VIEW COMPARISON:  05/04/2021, CT from 07/26/2021 FINDINGS: Cardiac shadow is stable. Right lung is clear. Left lower lobe infiltrate is noted consistent with pneumonia. Stable expansile lesion is noted in the left seventh rib. IMPRESSION: Left basilar pneumonia Electronically Signed   By: Inez Catalina M.D.   On: 12/27/2021 02:59    EKG: My personal interpretation of EKG shows: NSR    Assessment/Plan Principal Problem:   LLL pneumonia Active Problems:   Metastatic renal cell carcinoma to bone [facility])   Acquired solitary kidney - s/p left nephrectomy 07-2021    Assessment and Plan: * LLL pneumonia Admit to observation medical bed.  Start antibiotics with Rocephin and Zithromax.  Check strep pneumo and Legionella antigen..  IV Dilaudid for severe pain.  And oxycodone for moderate pain.  Lidoderm patches on the right side for his what right chest wall pain.  Obtain blood cultures.  Acquired solitary kidney - s/p left nephrectomy 07-2021 Stable.  Metastatic renal cell carcinoma to bone [facility]) Actively followed by heme-onc.   DVT prophylaxis: SQ Heparin Code Status: Full Code Family Communication: discussed with pt and wife sherrie at bedside  Disposition Plan: return home  Consults called: none  Admission status: Observation, Med-Surg   Kristopher Oppenheim, DO [facility] 12/27/2021, 5:43 AM

## 2021-12-27 NOTE — ED Triage Notes (Signed)
Patient arrives with right sided chest pain.Patient states chest pain worsened around 1200am. Patient describes pain as sharp constant, that worsens with movement. Patient does not take any blood thinners.

## 2021-12-27 NOTE — Assessment & Plan Note (Signed)
Stable

## 2021-12-27 NOTE — Plan of Care (Signed)
Patient seen and rounded on this morning.  Patient admitted after midnight. 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  He completed radiation in February 2023.  He has been on a ipilimumab and nivolumab (completed 4 cycles 07/20/2021).  He is continued on nivolumab monthly started on 08/24/2021. He presented after approximately 2 months of persistent coughing with no improvement/resolution.  He has not been on antibiotics prior to hospitalization. CTA chest showed dense and extensive left-sided pneumonia. He was started on Rocephin and azithromycin.  He did not have any hypoxia. Case was discussed with pulmonology given CT findings.  He was recommended for consideration of lung biopsy to rule out metastasis given prior left rib metastasis.  Plan: - see H&P as well - continue abx - continue pain control - pulmonology following; continue following for biopsy plans  Dwyane Dee, MD [facility] 12/27/2021, 12:52 PM

## 2021-12-27 NOTE — Assessment & Plan Note (Signed)
Actively followed by heme-onc.

## 2021-12-27 NOTE — Subjective & Objective (Addendum)
CC: chest pain HPI: 46 yo WM with of metastatic renal cell cancer to bone, s/p left nephrectomy, s/p left THA, presents to ER with right sided chest pain. Awoke tonight with chest pain. SOB but no fevers. Progressive pain in his chest. Right sided chest to right neck/right arm.  Came to ER for evaluation.  Interestingly, pt states he has had a cough for about 2 months. Discussed this with his oncologist who thought pt's cough may be related to chemo therapy. Pt has not had any imaging for w/u of his cough.  Cough is nonproductive.  On arrival to ER, temp 99.1, HR 94, BP 119/56. 96% RA sats  Labs: WBC 11.7, HgB 12.5, plt 345  CTPA shows extensive LLL pneumonia.  TRH consulted for admission.

## 2021-12-27 NOTE — Progress Notes (Signed)
NP Gordon Mcclain notified that patient refused heparin shot.

## 2021-12-27 NOTE — ED Provider Notes (Signed)
[facility] DEPT Provider Note   CSN: [id number] Arrival date & time: 12/27/21  0150     History  Chief Complaint  Patient presents with   Chest Pain    Gordon Mcclain is a 46 y.o. male.  Patient as above with significant medical history as below, including renal cancer status post left-sided nephrectomy, left hip arthroplasty.  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  Patient reports symptom onset around 8 PM this evening, feels the pain woke him from sleep.  Pain progressively worsened.  Described sharp, stabbing, radiation to right shoulder, right neck, down right arm.  Worsened with torso movement or deep inspiration.  Worse w/ ambulation. Will have dyspnea with ambulation, progressively worsening over last few hours PTA.  Subjective fever yesterday. No chills. Ongoing cough, feels he cannot produce the phlegm but has the sensation it is stuck in his chest. Patient did recent return from [place] by vehicle yesterday.  No history of DVT or PE.  Patient ambulate to the bathroom earlier this evening, he felt lightheaded, diaphoretic, thought he was going to have LOC, sat on the bed and symptoms improved.  This began after using the restroom.   No history of DVT or PE.  No medications prior to arrival.  Follows with Dr Alen Blew onc  Past Medical History:  Diagnosis Date   Frequent headaches    occipital region mostly: takes ibuprofen 800 mg bid most days   Palpitations    Zio patch reassuring 04/2021   Renal cancer ([place]) 03/2021   Left->presented with bone met to L rib.   Renal mass 08/02/2021   Renal mass, left 04/20/2021   Tobacco dependence     Past Surgical History:  Procedure Laterality Date   PARTIAL HIP ARTHROPLASTY Left    PILONIDAL CYST EXCISION  approx 2002   ROBOT ASSISTED LAPAROSCOPIC NEPHRECTOMY Left 08/02/2021   Procedure: XI ROBOTIC ASSISTED LAPAROSCOPIC  RADICAL NEPHRECTOMY;  Surgeon: Ceasar Mons, MD;  Location: [facility];  Service: Urology;  Laterality: Left;   WISDOM TOOTH EXTRACTION     zio patch     04/2021 zio reassuring.     The history is provided by the patient and the spouse. No language interpreter was used.  Chest Pain Associated symptoms: cough, fatigue, fever and shortness of breath   Associated symptoms: no abdominal pain, no dysphagia, no headache, no nausea, no palpitations and no vomiting        Home Medications Prior to Admission medications   Medication Sig Start Date End Date Taking? Authorizing Provider  acetaminophen (TYLENOL) 500 MG tablet Take 1,000 mg by mouth every 6 (six) hours as needed for moderate pain or headache.   Yes [provider]  prochlorperazine (COMPAZINE) 10 MG tablet Take 1 tablet (10 mg total) by mouth every 6 (six) hours as needed for nausea or vomiting. Patient not taking: Reported on 07/18/2021 05/09/21   Wyatt Portela, MD      Allergies    Opdivo [nivolumab]    Review of Systems   Review of Systems  Constitutional:  Positive for fatigue and fever. Negative for chills.  HENT:  Negative for facial swelling and trouble swallowing.   Eyes:  Negative for photophobia and visual disturbance.  Respiratory:  Positive for cough and shortness of breath.   Cardiovascular:  Positive for chest pain. Negative for palpitations.  Gastrointestinal:  Negative for abdominal pain,  nausea and vomiting.  Endocrine: Negative for polydipsia and polyuria.  Genitourinary:  Negative for difficulty urinating and hematuria.  Musculoskeletal:  Positive for arthralgias. Negative for gait problem and joint swelling.  Skin:  Negative for pallor and rash.  Neurological:  Negative for syncope and headaches.  Psychiatric/Behavioral:  Negative for agitation and confusion.     Physical Exam Updated Vital Signs BP (!) 111/59 (BP Location: Left Arm)   Pulse 71   Temp 97.8 F (36.6 C) (Oral)    Resp 20   Ht $R'5\' 5"'VL$  (1.651 m)   Wt 80.7 kg   SpO2 95%   BMI 29.62 kg/m  Physical Exam Vitals and nursing note reviewed.  Constitutional:      General: He is not in acute distress.    Appearance: Normal appearance. He is well-developed. He is not ill-appearing or diaphoretic.  HENT:     Head: Normocephalic and atraumatic. No raccoon eyes, Battle's sign, right periorbital erythema or left periorbital erythema.     Jaw: There is normal jaw occlusion. No trismus.     Right Ear: External ear normal.     Left Ear: External ear normal.     Mouth/Throat:     Mouth: Mucous membranes are moist.  Eyes:     General: No scleral icterus.    Extraocular Movements: Extraocular movements intact.     Pupils: Pupils are equal, round, and reactive to light.  Cardiovascular:     Rate and Rhythm: Normal rate and regular rhythm.     Pulses: Normal pulses.          Radial pulses are 2+ on the right side and 2+ on the left side.     Heart sounds: Normal heart sounds.     No S3 or S4 sounds.  Pulmonary:     Effort: Pulmonary effort is normal. No accessory muscle usage or respiratory distress.     Breath sounds: No stridor or decreased air movement. Decreased breath sounds present.     Comments: Adventitious/coarse breath sounds b/l Chest:    Abdominal:     General: Abdomen is flat.     Palpations: Abdomen is soft.     Tenderness: There is no abdominal tenderness.  Musculoskeletal:        General: Normal range of motion.     Cervical back: Normal range of motion.     Right lower leg: No edema.     Left lower leg: No edema.  Skin:    General: Skin is warm and dry.     Capillary Refill: Capillary refill takes less than 2 seconds.  Neurological:     Mental Status: He is alert and oriented to person, place, and time.     GCS: GCS eye subscore is 4. GCS verbal subscore is 5. GCS motor subscore is 6.     Cranial Nerves: Cranial nerves 2-12 are intact. No dysarthria or facial asymmetry.     Sensory:  Sensation is intact.     Motor: Motor function is intact. No tremor.     Coordination: Coordination is intact.  Psychiatric:        Mood and Affect: Mood normal.        Behavior: Behavior normal.     ED Results / Procedures / Treatments   Labs (all labs ordered are listed, but only abnormal results are displayed) Labs Reviewed  BASIC METABOLIC PANEL - Abnormal; Notable for the following components:      Result Value   Glucose, Bld  105 (*)    All other components within normal limits  CBC - Abnormal; Notable for the following components:   WBC 11.7 (*)    Hemoglobin 12.5 (*)    HCT 38.9 (*)    All other components within normal limits  HEPATIC FUNCTION PANEL - Abnormal; Notable for the following components:   Albumin 3.2 (*)    All other components within normal limits  RESP PANEL BY RT-PCR (FLU A&B, COVID) ARPGX2  CULTURE, BLOOD (ROUTINE X 2)  CULTURE, BLOOD (ROUTINE X 2)  LIPASE, BLOOD  TROPONIN I (HIGH SENSITIVITY)  TROPONIN I (HIGH SENSITIVITY)    EKG EKG Interpretation  Date/Time:  Tuesday December 27 2021 02:01:36 EDT Ventricular Rate:  96 PR Interval:  143 QRS Duration: 89 QT Interval:  334 QTC Calculation: 422 R Axis:   27 Text Interpretation: Sinus rhythm similar to prior, no stemi Confirmed by Wynona Dove ([id number]) on 12/27/2021 2:23:19 AM  Radiology CT Angio Chest PE W and/or Wo Contrast  Result Date: 12/27/2021 CLINICAL DATA:  Sharp chest pain abnormal chest x-ray. EXAM: CT ANGIOGRAPHY CHEST WITH CONTRAST TECHNIQUE: Multidetector CT imaging of the chest was performed using the standard protocol during bolus administration of intravenous contrast. Multiplanar CT image reconstructions and MIPs were obtained to evaluate the vascular anatomy. RADIATION DOSE REDUCTION: This exam was performed according to the departmental dose-optimization program which includes automated exposure control, adjustment of the mA and/or kV according to patient size and/or use of  iterative reconstruction technique. CONTRAST:  25mL OMNIPAQUE IOHEXOL 350 MG/ML SOLN COMPARISON:  05/04/2021 FINDINGS: Cardiovascular: Satisfactory opacification of the pulmonary arteries to the segmental level. No evidence of pulmonary embolism. Normal heart size. No pericardial effusion. Mediastinum/Nodes: Nodule at the superior left mediastinum which is stable, density favoring thyroid. Lungs/Pleura: Consolidation in the lingula and especially left lower lobe. Milder right middle and lower lobe patchy airspace disease. No interlobular septal thickening, effusion, or pneumothorax. Upper Abdomen: Post treatment changes partially covered in the left upper quadrant. 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 Review of the MIP images confirms the above findings. IMPRESSION: 1. Bilateral pneumonia, extensive at the left lung base. 2. Negative for pulmonary embolism. Electronically Signed   By: Jorje Guild M.D.   On: 12/27/2021 04:48   DG Chest 2 View  Result Date: 12/27/2021 CLINICAL DATA:  Chest pain EXAM: CHEST - 2 VIEW COMPARISON:  05/04/2021, CT from 07/26/2021 FINDINGS: Cardiac shadow is stable. Right lung is clear. Left lower lobe infiltrate is noted consistent with pneumonia. Stable expansile lesion is noted in the left seventh rib. IMPRESSION: Left basilar pneumonia Electronically Signed   By: Inez Catalina M.D.   On: 12/27/2021 02:59    Procedures .Critical Care  Performed by: Jeanell Sparrow, DO Authorized by: Jeanell Sparrow, DO   Critical care provider statement:    Critical care time (minutes):  34   Critical care time was exclusive of:  Separately billable procedures and treating other patients   Critical care was time spent personally by me on the following activities:  Development of treatment plan with patient or surrogate, discussions with consultants, evaluation of patient's response to treatment, examination of patient, ordering and review of laboratory  studies, ordering and review of radiographic studies, ordering and performing treatments and interventions, pulse oximetry, re-evaluation of patient's condition, review of old charts and obtaining history from patient or surrogate   Care discussed with: admitting provider       Medications Ordered in ED Medications  azithromycin (ZITHROMAX) 500 mg in sodium chloride 0.9 % 250 mL IVPB (500 mg Intravenous New Bag/Given 12/27/21 0652)  lactated ringers infusion ( Intravenous New Bag/Given 12/27/21 0649)  lidocaine (LIDODERM) 5 % 2 patch (2 patches Transdermal Patch Applied 12/27/21 0614)  sodium chloride 0.9 % bolus 1,000 mL (1,000 mLs Intravenous New Bag/Given 12/27/21 0407)  HYDROmorphone (DILAUDID) injection 0.5 mg (0.5 mg Intravenous Given 12/27/21 0407)  ondansetron (ZOFRAN) injection 4 mg (4 mg Intravenous Given 12/27/21 0405)  iohexol (OMNIPAQUE) 350 MG/ML injection 80 mL (80 mLs Intravenous Contrast Given 12/27/21 0419)  sodium chloride (PF) 0.9 % injection (  Given by Other 12/27/21 0438)  cefTRIAXone (ROCEPHIN) 1 g in sodium chloride 0.9 % 100 mL IVPB (1 g Intravenous New Bag/Given 12/27/21 0619)  HYDROmorphone (DILAUDID) injection 1 mg (1 mg Intravenous Given 12/27/21 0615)    ED Course/ Medical Decision Making/ A&P                           Medical Decision Making Amount and/or Complexity of Data Reviewed Labs: ordered. Radiology: ordered.  Risk Prescription drug management. Decision regarding hospitalization.   This patient presents to the ED with chief complaint(s) of chest pain, DIB with pertinent past medical history of renal cancer, as above which further complicates the presenting complaint. The complaint involves an extensive differential diagnosis and also carries with it a high risk of complications and morbidity.    In my evaluation of this patient's dyspnea my DDx includes, but is not limited to, pneumonia, pulmonary embolism, pneumothorax, pulmonary edema, metabolic  acidosis, asthma, COPD, cardiac cause, anemia, anxiety, etc.   Differential includes all life-threatening causes for chest pain. This includes but is not exclusive to acute coronary syndrome, aortic dissection, pulmonary embolism, cardiac tamponade, community-acquired pneumonia, pericarditis, musculoskeletal chest wall pain, etc.   . Serious etiologies were considered.   The initial plan is to screening labs ordered in triage Will add  hepatic function, lipase, CT PE   Additional history obtained: Additional history obtained from spouse Records reviewed Primary Care Documents >> He follows with Dr. Alen Blew oncology > History of prior radiation to the left ribs in February of this year, he is sp ipilimumab. Now on NivoluMab Side radiation therapy left hip May 23 Due for next Nivolumab tx on Thursday / Dr Alen Blew  Independent labs interpretation:  The following labs were independently interpreted:  He has mild leukocytosis 11.7 Creatinine stable, electrolytes stable.   Independent visualization of imaging: - I independently visualized the following imaging with scope of interpretation limited to determining acute life threatening conditions related to emergency care: chest X-ray with likely pneumonia, CT PE, which revealed diffuse bilateral pneumonia no PE  Cardiac monitoring was reviewed and interpreted by myself which shows NSR  Treatment and Reassessment: Dilaudid, ivf, antiemetic >> Symptoms transiently improved, will redose analgesics   Consultation: - Consulted or discussed management/test interpretation w/ external professional: na   Consideration for admission or further workup: Admission was considered   46 year old male with history as above including renal cancer status post left-sided nephrectomy on immunotherapy nivolumab with Dr. Alen Blew to the ED with right-sided chest pain, dyspnea, malaise, fatigue, cough.  Coarse breath sounds on auscultation bilateral,  adventitious breath sounds bilateral.  He is not hypoxic at rest but has significant increased work of breathing with minimal ambulation and will desaturate.  CT PE is concerning for bilateral pneumonia.  Port score is 87.  Discussed shared decision making with  patient and spouse at bedside.  We will plan for hospitalization.  We will begin IV antibiotics in the ED.  Repeat analgesics ordered.  Spoke w/ Dr Bridgett Larsson who accepts pt for admission.   Social Determinants of health: Social History   Tobacco Use   Smoking status: Former    Packs/day: 1.00    Years: 20.00    Total pack years: 20.00    Types: Cigarettes    Quit date: 04/27/2021    Years since quitting: 0.6    Passive exposure: Never   Smokeless tobacco: Never  Vaping Use   Vaping Use: Never used  Substance Use Topics   Alcohol use: No   Drug use: No            Final Clinical Impression(s) / ED Diagnoses Final diagnoses:  Pneumonia of both lungs due to infectious organism, unspecified part of lung  Personal history of renal cancer    Rx / DC Orders ED Discharge Orders     None         Jeanell Sparrow, DO 12/27/21 [id number]

## 2021-12-28 ENCOUNTER — Telehealth: Payer: Self-pay | Admitting: Pulmonary Disease

## 2021-12-28 DIAGNOSIS — D649 Anemia, unspecified: Secondary | ICD-10-CM

## 2021-12-28 DIAGNOSIS — Z905 Acquired absence of kidney: Secondary | ICD-10-CM | POA: Diagnosis not present

## 2021-12-28 DIAGNOSIS — J189 Pneumonia, unspecified organism: Secondary | ICD-10-CM | POA: Diagnosis not present

## 2021-12-28 DIAGNOSIS — C7951 Secondary malignant neoplasm of bone: Secondary | ICD-10-CM | POA: Diagnosis not present

## 2021-12-28 LAB — CBC WITH DIFFERENTIAL/PLATELET
Abs Immature Granulocytes: 0.07 10*3/uL (ref 0.00–0.07)
Basophils Absolute: 0.1 10*3/uL (ref 0.0–0.1)
Basophils Relative: 1 %
Eosinophils Absolute: 0.3 10*3/uL (ref 0.0–0.5)
Eosinophils Relative: 4 %
HCT: 35.2 % — ABNORMAL LOW (ref 39.0–52.0)
Hemoglobin: 11.3 g/dL — ABNORMAL LOW (ref 13.0–17.0)
Immature Granulocytes: 1 %
Lymphocytes Relative: 13 %
Lymphs Abs: 1.1 10*3/uL (ref 0.7–4.0)
MCH: 29 pg (ref 26.0–34.0)
MCHC: 32.1 g/dL (ref 30.0–36.0)
MCV: 90.5 fL (ref 80.0–100.0)
Monocytes Absolute: 1 10*3/uL (ref 0.1–1.0)
Monocytes Relative: 12 %
Neutro Abs: 5.7 10*3/uL (ref 1.7–7.7)
Neutrophils Relative %: 69 %
Platelets: 323 10*3/uL (ref 150–400)
RBC: 3.89 MIL/uL — ABNORMAL LOW (ref 4.22–5.81)
RDW: 12.6 % (ref 11.5–15.5)
WBC: 8.1 10*3/uL (ref 4.0–10.5)
nRBC: 0 % (ref 0.0–0.2)

## 2021-12-28 LAB — BASIC METABOLIC PANEL
Anion gap: 7 (ref 5–15)
BUN: 16 mg/dL (ref 6–20)
CO2: 26 mmol/L (ref 22–32)
Calcium: 8.6 mg/dL — ABNORMAL LOW (ref 8.9–10.3)
Chloride: 103 mmol/L (ref 98–111)
Creatinine, Ser: 1.14 mg/dL (ref 0.61–1.24)
GFR, Estimated: >60 mL/min (ref >60)
Glucose, Bld: 87 mg/dL (ref 70–99)
Potassium: 4.1 mmol/L (ref 3.5–5.1)
Sodium: 136 mmol/L (ref 135–145)

## 2021-12-28 LAB — MAGNESIUM: Magnesium: 2.2 mg/dL (ref 1.7–2.4)

## 2021-12-28 LAB — LEGIONELLA PNEUMOPHILA SEROGP 1 UR AG: L. pneumophila Serogp 1 Ur Ag: NEGATIVE

## 2021-12-28 LAB — PROTIME-INR
INR: 1.1 (ref 0.8–1.2)
Prothrombin Time: 13.7 seconds (ref 11.4–15.2)

## 2021-12-28 SURGERY — BRONCHOSCOPY, WITH FLUOROSCOPY
Anesthesia: General

## 2021-12-28 MED ORDER — CEFPODOXIME PROXETIL 200 MG PO TABS: 200.0000 mg | ORAL_TABLET | Freq: Two times a day (BID) | ORAL | 0 refills | Status: AC

## 2021-12-28 MED ORDER — CEFDINIR 300 MG PO CAPS: 300.0000 mg | ORAL_CAPSULE | Freq: Two times a day (BID) | ORAL | Status: DC

## 2021-12-28 MED ORDER — GUAIFENESIN ER 600 MG PO TB12: 600.0000 mg | ORAL_TABLET | Freq: Two times a day (BID) | ORAL | 0 refills | Status: AC

## 2021-12-28 MED ORDER — GUAIFENESIN ER 600 MG PO TB12: 1200.0000 mg | ORAL_TABLET | Freq: Two times a day (BID) | ORAL | Status: DC

## 2021-12-28 MED ORDER — AZITHROMYCIN 500 MG PO TABS: 500.0000 mg | ORAL_TABLET | Freq: Every day | ORAL | 0 refills | Status: AC

## 2021-12-28 NOTE — Discharge Summary (Signed)
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 *  Hospital Course:  HPI: 46 yo WM with of metastatic renal cell cancer to bone, s/p left nephrectomy, s/p left THA, presents to ER with right sided chest pain. Awoke tonight with chest pain. SOB but no fevers. Progressive pain in his chest. Right sided chest to right neck/right arm.  Came to ER for evaluation.   Interestingly, pt states he has had a cough for about 2 months. Discussed this with his oncologist who thought pt's cough may be related to chemo therapy. Pt has not had any imaging for w/u of his cough.   Cough is nonproductive.   On arrival to ER, temp 99.1, HR 94, BP 119/56. 96% RA sats   Labs: WBC 11.7, HgB 12.5, plt 345   CTPA shows extensive LLL pneumonia.   TRH consulted for admission.    ED Course: Lab work surprisingly good.  White cell count 11.7.  CTPA negative for PE but shows extensive left lower lobe consolidation and pneumonia.     Assessment and Plan: * Pneumonia of both lungs due to infectious organism Admit to observation medical bed.  Start antibiotics with Rocephin and Zithromax.  Check strep pneumo and Legionella antigen..  IV Dilaudid for severe pain.  And oxycodone for moderate pain.  Lidoderm patches on the  right side for his what right chest wall pain.  Obtain blood cultures.  Acquired solitary kidney - s/p left nephrectomy 07-2021 Stable.  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      {Tip this will not be part of the note when signed Body mass index is 29.62 kg/m. , ,  (Optional):26781}  {(NOTE) Pain control PDMP Statment (Optional):26782} Consultants: *** Procedures performed: ***  Disposition: {Plan; Disposition:26390} Diet recommendation:  {Diet_Plan:26776} DISCHARGE MEDICATION: Allergies as of 12/28/2021       Reactions   Opdivo [nivolumab] Other (See Comments)   Acute lower back pain - see progress note from 07/20/2021 and 09/29/21 Patient able to complete infusion with additional fluids run concurrently.      Med Rec must be completed prior to using this [facility]***       Discharge Exam: Filed Weights   12/27/21 0207  Weight: 80.7 kg   ***  Condition at discharge: {DC Condition:26389}  The results of significant diagnostics from this hospitalization (including imaging, microbiology, ancillary and laboratory) are listed below for reference.   Imaging Studies: CT Angio Chest PE W and/or Wo Contrast  Result Date: 12/27/2021 CLINICAL DATA:  Sharp chest pain abnormal chest x-ray. EXAM: CT ANGIOGRAPHY CHEST WITH CONTRAST TECHNIQUE: Multidetector CT imaging of the chest was performed using the standard protocol during bolus administration of intravenous contrast. Multiplanar CT image reconstructions and MIPs were obtained to evaluate the  vascular anatomy. RADIATION DOSE REDUCTION: This exam was performed according to the departmental dose-optimization program which includes automated exposure control, adjustment of the mA and/or kV according to patient size and/or use of iterative reconstruction technique. CONTRAST:  44m OMNIPAQUE IOHEXOL 350 MG/ML SOLN COMPARISON:  05/04/2021 FINDINGS: Cardiovascular: Satisfactory opacification of  the pulmonary arteries to the segmental level. No evidence of pulmonary embolism. Normal heart size. No pericardial effusion. Mediastinum/Nodes: Nodule at the superior left mediastinum which is stable, density favoring thyroid. Lungs/Pleura: Consolidation in the lingula and especially left lower lobe. Milder right middle and lower lobe patchy airspace disease. No interlobular septal thickening, effusion, or pneumothorax. Upper Abdomen: Post treatment changes partially covered in the left upper quadrant. 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 Review of the MIP images confirms the above findings. IMPRESSION: 1. Bilateral pneumonia, extensive at the left lung base. 2. Negative for pulmonary embolism. Electronically Signed   By: JJorje GuildM.D.   On: 12/27/2021 04:48   DG Chest 2 View  Result Date: 12/27/2021 CLINICAL DATA:  Chest pain EXAM: CHEST - 2 VIEW COMPARISON:  05/04/2021, CT from 07/26/2021 FINDINGS: Cardiac shadow is stable. Right lung is clear. Left lower lobe infiltrate is noted consistent with pneumonia. Stable expansile lesion is noted in the left seventh rib. IMPRESSION: Left basilar pneumonia Electronically Signed   By: MInez CatalinaM.D.   On: 12/27/2021 02:59    Microbiology: Results for orders placed or performed during the hospital encounter of 12/27/21  Culture, blood (Routine X 2) w Reflex to ID Panel     Status: None (Preliminary result)   Collection Time: 12/27/21  6:00 AM   Specimen: BLOOD  Result Value Ref Range Status   Specimen Description   Final    BLOOD RIGHT ANTECUBITAL Performed at [facility], [place] [address]   Special Requests   Final    BOTTLES DRAWN AEROBIC AND ANAEROBIC Blood Culture adequate volume Performed at [address], [place] [address]   Culture   Final    NO GROWTH < 24 HOURS Performed at [facility] [place] [address], [place]  [address]   Report Status PENDING  Incomplete  Resp Panel by RT-PCR (Flu A&B, Covid) Anterior Nasal Swab     Status: None   Collection Time: 12/27/21  6:01 AM   Specimen: Anterior Nasal Swab  Result Value Ref Range Status   SARS Coronavirus 2 by RT PCR NEGATIVE NEGATIVE Final    Comment: (NOTE) SARS-CoV-2 target nucleic acids are NOT DETECTED.  The SARS-CoV-2 RNA is generally detectable in upper respiratory specimens during the acute phase of infection. The lowest concentration of SARS-CoV-2 viral copies this assay can detect is 138 copies/mL. A negative result does not preclude SARS-Cov-2 infection and should not be used as the sole basis for treatment or other patient management decisions. A negative result may occur with  improper specimen collection/handling, submission of specimen other than nasopharyngeal swab, presence of viral mutation(s) within the areas targeted by this assay, and inadequate number of viral copies(<138 copies/mL). A negative result must be combined with clinical observations, patient history, and epidemiological information. The expected result is Negative.  Fact Sheet for Patients:  hEntrepreneurPulse.com.au Fact Sheet for Healthcare Providers:  hIncredibleEmployment.be This test is no t yet approved or cleared by the [place] and  has been authorized for detection and/or diagnosis of SARS-CoV-2  by FDA under an Emergency Use Authorization (EUA). This EUA will remain  in effect (meaning this test can be used) for the duration of the COVID-19 declaration under Section 564(b)(1) of the Act, 21 U.S.C.section 360bbb-3(b)(1), unless the authorization is terminated  or revoked sooner.       Influenza A by PCR NEGATIVE NEGATIVE Final   Influenza B by PCR NEGATIVE NEGATIVE Final    Comment: (NOTE) The Xpert Xpress SARS-CoV-2/FLU/RSV plus assay is intended as an aid in the diagnosis of  influenza from Nasopharyngeal swab specimens and should not be used as a sole basis for treatment. Nasal washings and aspirates are unacceptable for Xpert Xpress SARS-CoV-2/FLU/RSV testing.  Fact Sheet for Patients: EntrepreneurPulse.com.au  Fact Sheet for Healthcare Providers: IncredibleEmployment.be  This test is not yet approved or cleared by the [place] FDA and has been authorized for detection and/or diagnosis of SARS-CoV-2 by FDA under an Emergency Use Authorization (EUA). This EUA will remain in effect (meaning this test can be used) for the duration of the COVID-19 declaration under Section 564(b)(1) of the Act, 21 U.S.C. section 360bbb-3(b)(1), unless the authorization is terminated or revoked.  Performed at [facility], [place] [address] [place], [place] [address]   Culture, blood (Routine X 2) w Reflex to ID Panel     Status: None (Preliminary result)   Collection Time: 12/27/21  6:07 AM   Specimen: BLOOD  Result Value Ref Range Status   Specimen Description   Final    BLOOD LEFT ANTECUBITAL Performed at [place] [address] [place], [place] [address]    Special Requests   Final    BOTTLES DRAWN AEROBIC AND ANAEROBIC Blood Culture results may not be optimal due to an inadequate volume of blood received in culture bottles Performed at [place] [address], [place], [place] [address]    Culture   Final    NO GROWTH < 24 HOURS Performed at [place] [address], [place], [place] [address]    Report Status PENDING  Incomplete    Labs: CBC: Recent Labs  Lab 12/27/21 0209 12/28/21 0447  WBC 11.7* 8.1  NEUTROABS  --  5.7  HGB 12.5* 11.3*  HCT 38.9* 35.2*  MCV 89.2 90.5  PLT 345 094   Basic Metabolic Panel: Recent Labs  Lab 12/27/21 0209 12/28/21 0447  NA 135 136  K 4.3 4.1  CL 104 103  CO2 25 26  GLUCOSE 105* 87  BUN 18 16   CREATININE 0.99 1.14  CALCIUM 8.9 8.6*  MG  --  2.2   Liver Function Tests: Recent Labs  Lab 12/27/21 0338  AST 19  ALT 19  ALKPHOS 117  BILITOT 0.6  PROT 7.1  ALBUMIN 3.2*   CBG: No results for input(s): "GLUCAP" in the last 168 hours.  Discharge time spent: {LESS THAN/GREATER MHWK:08811} 30 minutes.  Signed: Kerney Elbe, DO [facility] 12/28/2021

## 2021-12-28 NOTE — Consult Note (Signed)
NAME:  Gordon Mcclain, MRN:  [id number], DOB:  [birth year], LOS: 0 ADMISSION DATE:  12/27/2021, CONSULTATION DATE:  10/3 REFERRING MD:  Dr. 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 He is currently being treated with nivolumab. The patient reports intermittent shortness of breath and nonproductive cough going back a couple of months.  He discussed the cough with his oncologist who felt it was likely secondary to chemotherapy.  CT of the chest in August of this year did show some consolidation in the left lower lobe.  He denies fevers, chills, productive cough during that time.  On 10/3 he awoke from sleep with severe right-sided chest pain causing him to present to [facility] Emergency Department. Imaging in the ED consistent with bilateral pneumonia with the left lower lobe in particular being densely consolidated. PCCM was consulted for further evaluation of LLL pneumonia.  Pertinent  Medical History   has a past medical history of Frequent headaches, Palpitations, Renal cancer ([place]) (03/2021), Renal mass (08/02/2021), Renal mass, left (04/20/2021), and Tobacco dependence.   Significant Hospital Events: Including procedures, antibiotic start and stop dates in addition to other pertinent events   10/3 Admit for PNA  Interim History / Subjective:   He is feeling much better today Pain has improved and he has not had pain medicine since yesterday morning  Objective   Blood pressure 115/74, pulse 83, temperature 98.6 F (37 C), temperature source Oral, resp. rate 17, height '5\' 5"'$  (1.651 m), weight 80.7 kg, SpO2 95 %.        Intake/Output Summary (Last 24 hours) at 12/28/2021 1327 Last data filed at 12/28/2021 1000 Gross per 24 hour  Intake 1313.33 ml  Output --  Net 1313.33 ml    Filed Weights   12/27/21 0207  Weight: 80.7 kg    Examination: General: no distress, resting in bed HEENT: [place]/AT, moist mucous membranes, sclera anicteric Neuro: A&O x 3, moving all extremities CV: rrr, s1s2, no murmurs PULM: bronchial breath sounds on left. No wheezing GI: soft, non-tender, non-distended, BS+ Extremities: warm, no edema Skin: no rashes     Resolved Hospital Problem list     Assessment & Plan:   Bilateral pneumonia: Patient is ill in setting of pneumonia mainly involving the left lower lobe and scattered small areas of the right lower lobe. The differential includes bacterial pneumonia vs organizing pneumonia from his recent monoclonal antibody therapy for renal cell carcinoma vs metastatic spread of disease in the lungs. He was improved with antibiotic therapy alone since admission.  - Will cancel bronchoscopy scheduled for today as he has improved on antibiotics - Plan discussed with patient's oncologist and agree taking conservative route at this time is reasonable - Plan to complete 5 days of azithromycin and 7 days of cefpodoxime - Will arrange follow up in pulmonary clinic in 1 month  Best Practice (right click and "Reselect all SmartList Selections" daily)   Per primary  Labs   CBC: Recent Labs  Lab 12/27/21 0209 12/28/21 0447  WBC 11.7* 8.1  NEUTROABS  --  5.7  HGB 12.5* 11.3*  HCT 38.9* 35.2*  MCV 89.2 90.5  PLT 345 619    Basic Metabolic Panel: Recent Labs  Lab 12/27/21 0209 12/28/21 0447  NA 135 136  K 4.3 4.1  CL 104  103  CO2 25 26  GLUCOSE 105* 87  BUN 18 16  CREATININE 0.99 1.14  CALCIUM 8.9 8.6*  MG  --  2.2   GFR: Estimated Creatinine Clearance: 78.4 mL/min (by C-G formula based on SCr of 1.14 mg/dL). Recent Labs  Lab 12/27/21 0209 12/27/21 1639 12/28/21 0447  PROCALCITON  --  <0.10  --   WBC 11.7*  --  8.1    Liver Function Tests: Recent Labs  Lab 12/27/21 0338  AST 19  ALT 19  ALKPHOS 117  BILITOT  0.6  PROT 7.1  ALBUMIN 3.2*   Recent Labs  Lab 12/27/21 0338  LIPASE 24   No results for input(s): "AMMONIA" in the last 168 hours.  ABG No results found for: "PHART", "PCO2ART", "PO2ART", "HCO3", "TCO2", "ACIDBASEDEF", "O2SAT"   Coagulation Profile: Recent Labs  Lab 12/28/21 0833  INR 1.1    Cardiac Enzymes: No results for input(s): "CKTOTAL", "CKMB", "CKMBINDEX", "TROPONINI" in the last 168 hours.  HbA1C: Hgb A1c MFr Bld  Date/Time Value Ref Range Status  04/08/2021 09:46 AM 5.4 4.6 - 6.5 % Final    Comment:    Glycemic Control Guidelines for People with Diabetes:Non Diabetic:  <6%Goal of Therapy: <7%Additional Action Suggested:  >8%     CBG: No results for input(s): "GLUCAP" in the last 168 hours.     Critical care time: n/a     Freda Jackson, MD [facility] Office: [phone]   See Amion for personal pager PCCM on call pager [phone] until 7pm. Please call Elink 7p-7a. [phone]

## 2021-12-28 NOTE — Telephone Encounter (Signed)
Please schedule patient for hospital follow up with me in 1 month for pneumonia with chest x-ray  Thanks, JD

## 2021-12-28 NOTE — Progress Notes (Signed)
Nurse reviewed discharge instructions with pt.  Pt verbalized understanding of discharge instructions, follow up appointments and new medications.  No concerns at time of discharge. 

## 2021-12-29 ENCOUNTER — Other Ambulatory Visit: Payer: Self-pay

## 2021-12-29 ENCOUNTER — Inpatient Hospital Stay: Payer: 59

## 2021-12-29 ENCOUNTER — Inpatient Hospital Stay: Payer: 59 | Attending: Physician Assistant | Admitting: Oncology

## 2021-12-29 VITALS — BP 112/75 | HR 92 | Temp 97.2°F | Resp 18 | Wt 180.2 lb

## 2021-12-29 DIAGNOSIS — R059 Cough, unspecified: Secondary | ICD-10-CM | POA: Diagnosis not present

## 2021-12-29 DIAGNOSIS — R0602 Shortness of breath: Secondary | ICD-10-CM | POA: Diagnosis not present

## 2021-12-29 DIAGNOSIS — C649 Malignant neoplasm of unspecified kidney, except renal pelvis: Secondary | ICD-10-CM

## 2021-12-29 DIAGNOSIS — R079 Chest pain, unspecified: Secondary | ICD-10-CM | POA: Diagnosis not present

## 2021-12-29 DIAGNOSIS — C642 Malignant neoplasm of left kidney, except renal pelvis: Secondary | ICD-10-CM | POA: Diagnosis present

## 2021-12-29 DIAGNOSIS — D649 Anemia, unspecified: Secondary | ICD-10-CM

## 2021-12-29 DIAGNOSIS — C7951 Secondary malignant neoplasm of bone: Secondary | ICD-10-CM | POA: Diagnosis present

## 2021-12-29 NOTE — Progress Notes (Signed)
Hematology and Oncology Follow Up Visit  Gordon Mcclain [id number] [birth year] 46 y.o. 12/29/2021 9:00 AM Gordon Mcclain, DOMatthews, Ne[place], DO   Principle Diagnosis: 46 year old man with kidney cancer diagnosed in January 2023.  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    Prior Therapy:  He is status post left hip surgery completed in March 2023.  He underwent left hemiarthroplasty at that time.  Palliative radiation therapy to the left ribs with total of 30 Gray in 10 fractions completed in February 2023.  Ipilimumab 1 mg/kg and nivolumab 3 mg/kg started on May 18, 2021.  He completed 4 cycles of therapy on July 20, 2021.  Radiation therapy to the left hip for a total of 30 Gray in 10 fractions completed in May 2023.  He is status post robotic assisted left radical nephrectomy completed on Aug 02, 2021.  The final pathology showed predominantly extensive necrosis with 10% of the tumor cells viable indicating T1b.   Current therapy: Nivolumab 480 mg monthly maintenance started on Aug 24, 2021.  Therapy has been on hold due to presumed pneumonitis.     Interim History: Gordon Mcclain is here for a follow-up visit.  Since last visit, he continues to developed a chronic nonproductive cough but overall felt well.  He had developed acute right-sided chest pain and was hospitalized acutely on December 27, 2021.  CT scan of the chest showed bilateral pneumonia but no evidence of pulmonary embolism.  He was evaluated by Dr. Erin Mcclain and was planning to have a bronchoscopy but had significant improvement on antibiotics.  Clinically today he reports his right-sided chest pain has improved.  He still has a nonproductive cough but no fevers or chills or sweats.  He continues to improve on antibiotic treatment.   Medications: Updated on review. Current Outpatient Medications  Medication Sig Dispense Refill   acetaminophen  (TYLENOL) 500 MG tablet Take 1,000 mg by mouth every 6 (six) hours as needed for moderate pain or headache.     azithromycin (ZITHROMAX) 500 MG tablet Take 1 tablet (500 mg total) by mouth daily for 4 days. 4 tablet 0   cefpodoxime (VANTIN) 200 MG tablet Take 1 tablet (200 mg total) by mouth 2 (two) times daily for 6 days. 12 tablet 0   guaiFENesin (MUCINEX) 600 MG 12 hr tablet Take 1 tablet (600 mg total) by mouth 2 (two) times daily for 5 days. 10 tablet 0   prochlorperazine (COMPAZINE) 10 MG tablet Take 1 tablet (10 mg total) by mouth every 6 (six) hours as needed for nausea or vomiting. (Patient not taking: Reported on 07/18/2021) 30 tablet 0   No current facility-administered medications for this visit.     Allergies:  Allergies  Allergen Reactions   Opdivo [Nivolumab] Other (See Comments)    Acute lower back pain - see progress note from 07/20/2021 and 09/29/21 Patient able to complete infusion with additional fluids run concurrently.      Physical Exam:   Blood pressure 112/75, pulse 92, temperature (!) 97.2 F (36.2 C), temperature source Oral, resp. rate 18, weight 180 lb 3 oz (81.7 kg), SpO2 97 %.     ECOG: 1   General appearance: Alert, awake without any distress. Head: Atraumatic without abnormalities Oropharynx: Without any thrush or ulcers. Eyes: No scleral icterus. Lymph nodes: No lymphadenopathy noted in the cervical, supraclavicular, or axillary nodes Heart:regular rate and rhythm, without any murmurs or gallops.  Lung: Clear to auscultation without any rhonchi, wheezes or dullness to percussion. Abdomin: Soft, nontender without any shifting dullness or ascites. Musculoskeletal: No clubbing or cyanosis. Neurological: No motor or sensory deficits. Skin: No rashes or lesions.      Lab Results: Lab Results  Component Value Date   WBC 8.1 12/28/2021   HGB 11.3 (L) 12/28/2021   HCT 35.2 (L) 12/28/2021   MCV 90.5 12/28/2021   PLT 323 12/28/2021      Chemistry      Component Value Date/Time   NA 136 12/28/2021 0447   K 4.1 12/28/2021 0447   CL 103 12/28/2021 0447   CO2 26 12/28/2021 0447   BUN 16 12/28/2021 0447   CREATININE 1.14 12/28/2021 0447   CREATININE 1.02 12/01/2021 0817      Component Value Date/Time   CALCIUM 8.6 (L) 12/28/2021 0447   ALKPHOS 117 12/27/2021 0338   AST 19 12/27/2021 0338   AST 12 (L) 12/01/2021 0817   ALT 19 12/27/2021 0338   ALT 11 12/01/2021 0817   BILITOT 0.6 12/27/2021 0338   BILITOT 0.5 12/01/2021 0817          Impression and Plan:   46 year old with:  1.  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   He is currently on maintenance nivolumab that has been withheld due to presumed pneumonitis.  The natural course of this disease and different salvage therapy options were discussed.  At this time I have recommended to continue to hold off on any therapy and potentially update his staging scans in 2 to 3 months.  CT scan of the chest obtained in October 2023 did not show any evidence of malignancy.   2.  Chest pain and shortness of breath: This appears to be community-acquired pneumonia that has improved on antibiotics.  He could have an underlying pneumonitis as well related to immunotherapy.  If his symptoms do not improve bronchoscopy would be recommended a trial of steroids could be used.      4.  Immunotherapy surveillance: No evidence of colitis, hepatitis or hypophysitis.  Possible pneumonitis is under desiccation.    5.   Follow-up: He will return next month for a repeat evaluation.     30  minutes were dedicated to this visit.  The time was spent on updating his disease status, treatment choices and outlining future plan of care review.   Gordon Button, MD 10/5/20239:00 AM

## 2022-01-01 LAB — CULTURE, BLOOD (ROUTINE X 2)
Culture: NO GROWTH
Culture: NO GROWTH
Special Requests: ADEQUATE

## 2022-01-02 ENCOUNTER — Other Ambulatory Visit: Payer: Self-pay | Admitting: Urology

## 2022-01-02 DIAGNOSIS — E041 Nontoxic single thyroid nodule: Secondary | ICD-10-CM

## 2022-01-02 DIAGNOSIS — C642 Malignant neoplasm of left kidney, except renal pelvis: Secondary | ICD-10-CM

## 2022-01-02 DIAGNOSIS — C7951 Secondary malignant neoplasm of bone: Secondary | ICD-10-CM

## 2022-01-06 ENCOUNTER — Other Ambulatory Visit: Payer: Self-pay | Admitting: Urology

## 2022-01-06 DIAGNOSIS — E041 Nontoxic single thyroid nodule: Secondary | ICD-10-CM

## 2022-01-11 ENCOUNTER — Encounter: Payer: Self-pay | Admitting: Oncology

## 2022-01-13 ENCOUNTER — Ambulatory Visit
Admission: RE | Admit: 2022-01-13 | Discharge: 2022-01-13 | Disposition: A | Discharge status: Home / Self Care | Payer: 59 | Source: Ambulatory Visit | Attending: Urology | Admitting: Urology

## 2022-01-13 DIAGNOSIS — E041 Nontoxic single thyroid nodule: Secondary | ICD-10-CM

## 2022-01-17 ENCOUNTER — Encounter: Payer: Self-pay | Admitting: Pulmonary Disease

## 2022-01-19 NOTE — Telephone Encounter (Signed)
Patient called the office to move up appointment but there is no openings with a provider. I do not see where one of our providers saw patient in the hospital- so NP couldn't see patient. Please advise if patient needs to be worked in somewhere.

## 2022-01-19 NOTE — Telephone Encounter (Signed)
I called and spoke with the wife and he is not able to come in tomorrow 01/20/2022 for a sick visit. He has a follow up on 01/23/2022 with Katie Cobb. He is aware that he is to go to ER or UC if his symptoms get worse and he voices understanding. Nothing further needed.

## 2022-01-19 NOTE — Telephone Encounter (Signed)
Has not been seen in office  Will need ov for further evaluation There is sick spots on APP schedule tomorrow  Please contact office for sooner follow up if symptoms do not improve or worsen or seek emergency care

## 2022-01-19 NOTE — Telephone Encounter (Signed)
Tammy, please advise on pt's messages. Dr. Erin Fulling is unavailable. Pt was hospitalized early October for pna. Pt has HFU on 11/6 with JD with a CXR. Thanks.

## 2022-01-23 ENCOUNTER — Ambulatory Visit
Admission: RE | Admit: 2022-01-23 | Discharge: 2022-01-23 | Disposition: A | Discharge status: Home / Self Care | Payer: 59 | Source: Ambulatory Visit | Attending: Nurse Practitioner | Admitting: Nurse Practitioner

## 2022-01-23 ENCOUNTER — Ambulatory Visit (INDEPENDENT_AMBULATORY_CARE_PROVIDER_SITE_OTHER): Payer: 59

## 2022-01-23 ENCOUNTER — Encounter: Payer: Self-pay | Admitting: Pulmonary Disease

## 2022-01-23 ENCOUNTER — Ambulatory Visit (INDEPENDENT_AMBULATORY_CARE_PROVIDER_SITE_OTHER): Payer: 59 | Admitting: Nurse Practitioner

## 2022-01-23 ENCOUNTER — Encounter: Payer: Self-pay | Admitting: Nurse Practitioner

## 2022-01-23 ENCOUNTER — Telehealth: Payer: Self-pay | Admitting: Nurse Practitioner

## 2022-01-23 VITALS — BP 104/78 | HR 79 | Ht 65.0 in | Wt 182.4 lb

## 2022-01-23 DIAGNOSIS — R059 Cough, unspecified: Secondary | ICD-10-CM | POA: Diagnosis not present

## 2022-01-23 DIAGNOSIS — R053 Chronic cough: Secondary | ICD-10-CM | POA: Diagnosis not present

## 2022-01-23 DIAGNOSIS — J189 Pneumonia, unspecified organism: Secondary | ICD-10-CM

## 2022-01-23 DIAGNOSIS — C7951 Secondary malignant neoplasm of bone: Secondary | ICD-10-CM

## 2022-01-23 DIAGNOSIS — C649 Malignant neoplasm of unspecified kidney, except renal pelvis: Secondary | ICD-10-CM

## 2022-01-23 DIAGNOSIS — R0982 Postnasal drip: Secondary | ICD-10-CM | POA: Diagnosis not present

## 2022-01-23 DIAGNOSIS — Z1152 Encounter for screening for COVID-19: Secondary | ICD-10-CM

## 2022-01-23 DIAGNOSIS — Z01812 Encounter for preprocedural laboratory examination: Secondary | ICD-10-CM

## 2022-01-23 LAB — POCT EXHALED NITRIC OXIDE: FeNO level (ppb): 8

## 2022-01-23 MED ORDER — FLUTICASONE PROPIONATE 50 MCG/ACT NA SUSP: 2.0000 | Freq: Every day | NASAL | 2 refills | Status: DC

## 2022-01-23 MED ORDER — ALBUTEROL SULFATE HFA 108 (90 BASE) MCG/ACT IN AERS: 2.0000 | INHALATION_SPRAY | Freq: Four times a day (QID) | RESPIRATORY_TRACT | 6 refills | Status: DC | PRN

## 2022-01-23 NOTE — H&P (View-Only) (Signed)
_0  ID: Gordon Mcclain, male    DOB: [birth year], 46 y.o.   MRN: [id number]  Chief Complaint  Patient presents with   Follow-up    Pt f/u seen [name] in hospital w/ possible pneumonia. Pt denies having fevers.  He expresses having SOB and a dry cough. Pt has kidney cancer and they are currently holding on treatment due to symptoms.     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 He was recently admitted from 12/27/2021-12/28/2021 for pneumonia involving LLL and scattered small areas in the RLL. He was scheduled for bronchoscopy on 10/4 but after improvement with antibiotic therapy, this was canceled. He was discharged with plan to complete 5 days of azithromycin and 7 days of cefpodoxime.   TEST/EVENTS:  12/27/2021 CTA chest: No evidence of PE.  There is a nodule at the superior left mediastinum which is stable.  Consolidation in the lingula and especially left lower lobe.  Mild a right middle and lower lobe patchy airspace disease.  Posttreatment changes in the left upper quadrant.  01/23/2022: Today - acute Patient presents today with his wife for intended acute visit. He was discharged on 10/4 after being treated for suspected bacterial pneumonia with cefpodoxime and azithromycin. There was some concern for possible organizing pna from recent monoclonal antibody therapy vs metastatic disease but he initially had some improvement with antibiotic therapy so bronchoscopy was canceled. Today, he tells me that he completed his antibiotics but he hasn't felt any different since he was discharged. No worse but no better. Still has a persistent, dry cough, which he's had since August. He also gets short winded with exertion, usually longer distances or uphill climbing. Otherwise, he can walk at his own pace on level ground without difficulty. He does have some postnasal drip and clear  nasal drainage, which is unchanged from baseline. No fevers, chills, hemoptysis, weight loss, anorexia, leg swelling, orthopnea, wheezing. He has never tried any inhalers. Therapy for his renal cell carcinoma has been on hold since his cough started in August.   Allergies  Allergen Reactions   Opdivo [Nivolumab] Other (See Comments)    Acute lower back pain - see progress note from 07/20/2021 and 09/29/21 Patient able to complete infusion with additional fluids run concurrently.     Immunization History  Administered Date(s) Administered   Tdap 06/21/2019    Past Medical History:  Diagnosis Date   Frequent headaches    occipital region mostly: takes ibuprofen 800 mg bid most days   Palpitations    Zio patch reassuring 04/2021   Renal cancer ([place]) 03/2021   Left->presented with bone met to L rib.   Renal mass 08/02/2021   Renal mass, left 04/20/2021   Tobacco dependence     Tobacco History: Social History   Tobacco Use  Smoking Status Former   Packs/day: 1.00   Years: 20.00   Total pack years: 20.00   Types: Cigarettes   Quit date: 04/27/2021   Years since quitting: 0.7   Passive exposure: Never  Smokeless Tobacco Never   Counseling given: Not Answered   Outpatient Medications Prior to Visit  Medication Sig Dispense Refill   acetaminophen (TYLENOL) 500 MG tablet Take 1,000 mg by mouth every 6 (six) hours as needed for moderate pain or headache.     prochlorperazine (COMPAZINE) 10 MG tablet Take 1 tablet (10 mg total) by mouth every  6 (six) hours as needed for nausea or vomiting. 30 tablet 0   No facility-administered medications prior to visit.     Review of Systems:   Constitutional: No weight loss or gain, night sweats, fevers, chills, fatigue, or lassitude. HEENT: No headaches, difficulty swallowing, tooth/dental problems, or sore throat. No sneezing, itching, ear ache. +nasal congestion, post nasal drip CV:  No chest pain, orthopnea, PND, swelling in lower  extremities, anasarca, dizziness, palpitations, syncope Resp: +shortness of breath with exertion; dry cough. No excess mucus or change in color of mucus. No hemoptysis. No wheezing.  No chest wall deformity GI:  No heartburn, indigestion, abdominal pain, nausea, vomiting, diarrhea, change in bowel habits, loss of appetite, bloody stools.  Skin: No rash, lesions, ulcerations MSK:  No increased joint pain or swelling.  No decreased range of motion.  No back pain. Neuro: No dizziness or lightheadedness.  Psych: No depression or anxiety. Mood stable.     Physical Exam:  BP 104/78   Pulse 79   Ht _0  (1.651 m)   Wt 182 lb 6.4 oz (82.7 kg)   SpO2 96%   BMI 30.35 kg/m   GEN: Pleasant, interactive, well-appearing; in no acute distress. HEENT:  Normocephalic and atraumatic. PERRLA. Sclera white. Nasal turbinates pink, moist and patent bilaterally. Clear rhinorrhea present. Oropharynx erythematous and moist, without exudate or edema. No lesions, ulcerations NECK:  Supple w/ fair ROM. No JVD present. Normal carotid impulses w/o bruits. Thyroid symmetrical with no goiter or nodules palpated. No lymphadenopathy.   CV: RRR, no m/r/g, no peripheral edema. Pulses intact, +2 bilaterally. No cyanosis, pallor or clubbing. PULMONARY:  Unlabored, regular breathing. Diminished bases bilaterally A&P w/o wheezes/rales/rhonchi. No accessory muscle use.  GI: BS present and normoactive. Soft, non-tender to palpation. No organomegaly or masses detected. MSK: No erythema, warmth or tenderness. Cap refil <2 sec all extrem. No deformities or joint swelling noted.  Neuro: A/Ox3. No focal deficits noted.   Skin: Warm, no lesions or rashe Psych: Normal affect and behavior. Judgement and thought content appropriate.     Lab Results:  CBC    Component Value Date/Time   WBC 8.1 12/28/2021 0447   RBC 3.89 (L) 12/28/2021 0447   HGB 11.3 (L) 12/28/2021 0447   HGB 12.6 (L) 12/01/2021 0817   HCT 35.2 (L)  12/28/2021 0447   PLT 323 12/28/2021 0447   PLT 369 12/01/2021 0817   MCV 90.5 12/28/2021 0447   MCH 29.0 12/28/2021 0447   MCHC 32.1 12/28/2021 0447   RDW 12.6 12/28/2021 0447   LYMPHSABS 1.1 12/28/2021 0447   MONOABS 1.0 12/28/2021 0447   EOSABS 0.3 12/28/2021 0447   BASOSABS 0.1 12/28/2021 0447    BMET    Component Value Date/Time   NA 136 12/28/2021 0447   K 4.1 12/28/2021 0447   CL 103 12/28/2021 0447   CO2 26 12/28/2021 0447   GLUCOSE 87 12/28/2021 0447   BUN 16 12/28/2021 0447   CREATININE 1.14 12/28/2021 0447   CREATININE 1.02 12/01/2021 0817   CALCIUM 8.6 (L) 12/28/2021 0447   GFRNONAA >60 12/28/2021 0447   GFRNONAA >60 12/01/2021 0817    BNP No results found for: "BNP"   Imaging:  DG Chest 2 View  Result Date: 01/23/2022 CLINICAL DATA:  Bilateral pneumonia. EXAM: CHEST - 2 VIEW COMPARISON:  December 27, 2021. FINDINGS: The heart size and mediastinal contours are within normal limits. Significantly increased left perihilar opacity is noted concerning for worsening pneumonia. Minimal right basilar atelectasis  or scarring is noted. The visualized skeletal structures are unremarkable. IMPRESSION: Significant increased left lung opacity is noted consistent with worsening pneumonia. Electronically Signed   By: Marijo Conception M.D.   On: 01/23/2022 13:25   US THYROID  Result Date: 01/13/2022 CLINICAL DATA:  46 year old male with a history thyroid nodules EXAM: THYROID ULTRASOUND TECHNIQUE: Ultrasound examination of the thyroid gland and adjacent soft tissues was performed. COMPARISON:  Chest CT 12/27/2021 FINDINGS: Parenchymal Echotexture: Mildly heterogenous Isthmus: 0.4 cm Right lobe: 6.1 cm x 2.4 cm x 2.3 cm Left lobe: 5.0 cm x 1.6 cm x 1.8 cm _________________________________________________________ Estimated total number of nodules >/= 1 cm: 2 Number of spongiform nodules >/=  2 cm not described below (TR1): 0 Number of mixed cystic and solid nodules >/= 1.5 cm not  described below (Marcus): 0 _________________________________________________________ Nodule # 1: Location: Isthmus; Inferior Maximum size: 3.8 cm; Other 2 dimensions: 3.3 cm x 1.9 cm Composition: solid/almost completely solid (2) Echogenicity: isoechoic (1) Shape: not taller-than-wide (0) Margins: ill-defined (0) Echogenic foci: none (0) ACR TI-RADS total points: 3. ACR TI-RADS risk category: TR3 (3 points). ACR TI-RADS recommendations: Biopsy _________________________________________________________ Nodule # 2: Location: Left; Mid Maximum size: 0.7 cm; Other 2 dimensions: 0.6 cm x 0.5 cm Composition: cannot determine (2) Echogenicity: hypoechoic (2) Shape: not taller-than-wide (0) Margins: ill-defined (0) Echogenic foci: none (0) ACR TI-RADS total points: 3. ACR TI-RADS risk category: TR3 (3 points). ACR TI-RADS recommendations: No surveillance _________________________________________________________ Nodule # 3: Location: Left; Inferior Maximum size: 2.3 cm; Other 2 dimensions: 1.6 cm x 1.4 cm Composition: solid/almost completely solid (2) Echogenicity: hypoechoic (2) Shape: not taller-than-wide (0) Margins: cannot determine (0) Echogenic foci: none (0) ACR TI-RADS total points: 4. ACR TI-RADS risk category: TR4 (4-6 points). ACR TI-RADS recommendations: Nodule meets criteria for biopsy _________________________________________________________ No adenopathy IMPRESSION: Isthmic thyroid nodule (labeled 1, 3.8 cm) meets criteria for biopsy, as designated by the newly established ACR TI-RADS criteria, and referral for biopsy is recommended. Inferior left thyroid nodule of uncertain origin, potentially exophytic thyroid nodule versus parathyroid gland. If this represents thyroid nodule, the nodule meets criteria for biopsy, as designated by the newly established ACR TI-RADS criteria, and referral for biopsy is recommended. If further evaluation to determine whether this represents parathyroid gland is necessary,  potentially nuclear medicine sestamibi scan may be useful. Recommendations follow those established by the new ACR TI-RADS criteria (J Am Coll Radiol 3354;56:256-389). Electronically Signed   By: Corrie Mckusick D.O.   On: 01/13/2022 14:17   CT Angio Chest PE W and/or Wo Contrast  Result Date: 12/27/2021 CLINICAL DATA:  Sharp chest pain abnormal chest x-ray. EXAM: CT ANGIOGRAPHY CHEST WITH CONTRAST TECHNIQUE: Multidetector CT imaging of the chest was performed using the standard protocol during bolus administration of intravenous contrast. Multiplanar CT image reconstructions and MIPs were obtained to evaluate the vascular anatomy. RADIATION DOSE REDUCTION: This exam was performed according to the departmental dose-optimization program which includes automated exposure control, adjustment of the mA and/or kV according to patient size and/or use of iterative reconstruction technique. CONTRAST:  63m OMNIPAQUE IOHEXOL 350 MG/ML SOLN COMPARISON:  05/04/2021 FINDINGS: Cardiovascular: Satisfactory opacification of the pulmonary arteries to the segmental level. No evidence of pulmonary embolism. Normal heart size. No pericardial effusion. Mediastinum/Nodes: Nodule at the superior left mediastinum which is stable, density favoring thyroid. Lungs/Pleura: Consolidation in the lingula and especially left lower lobe. Milder right middle and lower lobe patchy airspace disease. No interlobular septal thickening, effusion, or pneumothorax. Upper Abdomen:  Post treatment changes partially covered in the left upper quadrant. 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 Review of the MIP images confirms the above findings. IMPRESSION: 1. Bilateral pneumonia, extensive at the left lung base. 2. Negative for pulmonary embolism. Electronically Signed   By: Jorje Guild M.D.   On: 12/27/2021 04:48   DG Chest 2 View  Result Date: 12/27/2021 CLINICAL DATA:  Chest pain EXAM: CHEST - 2 VIEW COMPARISON:   05/04/2021, CT from 07/26/2021 FINDINGS: Cardiac shadow is stable. Right lung is clear. Left lower lobe infiltrate is noted consistent with pneumonia. Stable expansile lesion is noted in the left seventh rib. IMPRESSION: Left basilar pneumonia Electronically Signed   By: Inez Catalina M.D.   On: 12/27/2021 02:59          No data to display          No results found for: "NITRICOXIDE"      Assessment & Plan:   Chronic cough Persistent, unresolved dry cough. He felt some better after being started on abx during his hospital stay but since, feels like his cough is unchanged. He's also still experiencing DOE. Symptoms initially started in August. Given minimal response to abx and lack of infectious symptoms as well as timeline of onset with monoclonal therapy, high suspicion for drug induced organizing pneumonia. There is still concern that this could be related to metastatic disease as well. We will obtain CT chest today for further evaluation and set him up for bronchoscopy this week.   Patient Instructions  Chest x ray looks worse today. We will obtain CT chest for further evaluation. I will call you to discuss next steps but we will likely need to move forward with bronchoscopy.   Albuterol inhaler 2 puffs every 6 hours as needed for shortness of breath or wheezing.  Flonase nasal spray 2 sprays each nostril daily   We will determine follow up after your CT chest. If symptoms do not improve or worsen, please contact office for sooner follow up or seek emergency care.    Pneumonia of both lungs due to infectious organism Lack of improvement in symptoms despite abx therapy. No significant infectious symptoms; cough is dry. See above plan.  Postnasal drip Rhinitis and postnasal drip. Possible UACS component; although, more likely related to underlying pulmonary etiology. Start flonase nasal spray for postnasal drainage control. See above.   Metastatic renal cell carcinoma to bone  (HCC) Stage IV renal cell carcinoma. Completed 4 cycles of ipilimumab/nivolumab in April 2023 and palliative radiation to the left hip. He continued nivolumab infusions, which were held in May 2023 due to suspected pneumonitis. See above. Follow up with Dr. Alen Blew as scheduled.    I spent 41 minutes of dedicated to the care of this patient on the date of this encounter to include pre-visit review of records, face-to-face time with the patient discussing conditions above, post visit ordering of testing, clinical documentation with the electronic health record, making appropriate referrals as documented, and communicating necessary findings to members of the patients care team.  Clayton Bibles, NP 01/23/2022  Pt aware and understands NP's role.

## 2022-01-23 NOTE — Patient Instructions (Addendum)
Chest x ray looks worse today. We will obtain CT chest for further evaluation. I will call you to discuss next steps but we will likely need to move forward with bronchoscopy.   Albuterol inhaler 2 puffs every 6 hours as needed for shortness of breath or wheezing.  Flonase nasal spray 2 sprays each nostril daily   We will determine follow up after your CT chest. If symptoms do not improve or worsen, please contact office for sooner follow up or seek emergency care.

## 2022-01-23 NOTE — Progress Notes (Addendum)
_0  ID: Gordon Mcclain, male    DOB: [birth year], 46 y.o.   MRN: [id number]  Chief Complaint  Patient presents with   Follow-up    Pt f/u seen [name] in hospital w/ possible pneumonia. Pt denies having fevers.  He expresses having SOB and a dry cough. Pt has kidney cancer and they are currently holding on treatment due to symptoms.     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 He was recently admitted from 12/27/2021-12/28/2021 for pneumonia involving LLL and scattered small areas in the RLL. He was scheduled for bronchoscopy on 10/4 but after improvement with antibiotic therapy, this was canceled. He was discharged with plan to complete 5 days of azithromycin and 7 days of cefpodoxime.   TEST/EVENTS:  12/27/2021 CTA chest: No evidence of PE.  There is a nodule at the superior left mediastinum which is stable.  Consolidation in the lingula and especially left lower lobe.  Mild a right middle and lower lobe patchy airspace disease.  Posttreatment changes in the left upper quadrant.  01/23/2022: Today - acute Patient presents today with his wife for intended acute visit. He was discharged on 10/4 after being treated for suspected bacterial pneumonia with cefpodoxime and azithromycin. There was some concern for possible organizing pna from recent monoclonal antibody therapy vs metastatic disease but he initially had some improvement with antibiotic therapy so bronchoscopy was canceled. Today, he tells me that he completed his antibiotics but he hasn't felt any different since he was discharged. No worse but no better. Still has a persistent, dry cough, which he's had since August. He also gets short winded with exertion, usually longer distances or uphill climbing. Otherwise, he can walk at his own pace on level ground without difficulty. He does have some postnasal drip and clear  nasal drainage, which is unchanged from baseline. No fevers, chills, hemoptysis, weight loss, anorexia, leg swelling, orthopnea, wheezing. He has never tried any inhalers. Therapy for his renal cell carcinoma has been on hold since his cough started in August.   Allergies  Allergen Reactions   Opdivo [Nivolumab] Other (See Comments)    Acute lower back pain - see progress note from 07/20/2021 and 09/29/21 Patient able to complete infusion with additional fluids run concurrently.     Immunization History  Administered Date(s) Administered   Tdap 06/21/2019    Past Medical History:  Diagnosis Date   Frequent headaches    occipital region mostly: takes ibuprofen 800 mg bid most days   Palpitations    Zio patch reassuring 04/2021   Renal cancer ([place]) 03/2021   Left->presented with bone met to L rib.   Renal mass 08/02/2021   Renal mass, left 04/20/2021   Tobacco dependence     Tobacco History: Social History   Tobacco Use  Smoking Status Former   Packs/day: 1.00   Years: 20.00   Total pack years: 20.00   Types: Cigarettes   Quit date: 04/27/2021   Years since quitting: 0.7   Passive exposure: Never  Smokeless Tobacco Never   Counseling given: Not Answered   Outpatient Medications Prior to Visit  Medication Sig Dispense Refill   acetaminophen (TYLENOL) 500 MG tablet Take 1,000 mg by mouth every 6 (six) hours as needed for moderate pain or headache.     prochlorperazine (COMPAZINE) 10 MG tablet Take 1 tablet (10 mg total) by mouth every  6 (six) hours as needed for nausea or vomiting. 30 tablet 0   No facility-administered medications prior to visit.     Review of Systems:   Constitutional: No weight loss or gain, night sweats, fevers, chills, fatigue, or lassitude. HEENT: No headaches, difficulty swallowing, tooth/dental problems, or sore throat. No sneezing, itching, ear ache. +nasal congestion, post nasal drip CV:  No chest pain, orthopnea, PND, swelling in lower  extremities, anasarca, dizziness, palpitations, syncope Resp: +shortness of breath with exertion; dry cough. No excess mucus or change in color of mucus. No hemoptysis. No wheezing.  No chest wall deformity GI:  No heartburn, indigestion, abdominal pain, nausea, vomiting, diarrhea, change in bowel habits, loss of appetite, bloody stools.  Skin: No rash, lesions, ulcerations MSK:  No increased joint pain or swelling.  No decreased range of motion.  No back pain. Neuro: No dizziness or lightheadedness.  Psych: No depression or anxiety. Mood stable.     Physical Exam:  BP 104/78   Pulse 79   Ht _0  (1.651 m)   Wt 182 lb 6.4 oz (82.7 kg)   SpO2 96%   BMI 30.35 kg/m   GEN: Pleasant, interactive, well-appearing; in no acute distress. HEENT:  Normocephalic and atraumatic. PERRLA. Sclera white. Nasal turbinates pink, moist and patent bilaterally. Clear rhinorrhea present. Oropharynx erythematous and moist, without exudate or edema. No lesions, ulcerations NECK:  Supple w/ fair ROM. No JVD present. Normal carotid impulses w/o bruits. Thyroid symmetrical with no goiter or nodules palpated. No lymphadenopathy.   CV: RRR, no m/r/g, no peripheral edema. Pulses intact, +2 bilaterally. No cyanosis, pallor or clubbing. PULMONARY:  Unlabored, regular breathing. Diminished bases bilaterally A&P w/o wheezes/rales/rhonchi. No accessory muscle use.  GI: BS present and normoactive. Soft, non-tender to palpation. No organomegaly or masses detected. MSK: No erythema, warmth or tenderness. Cap refil <2 sec all extrem. No deformities or joint swelling noted.  Neuro: A/Ox3. No focal deficits noted.   Skin: Warm, no lesions or rashe Psych: Normal affect and behavior. Judgement and thought content appropriate.     Lab Results:  CBC    Component Value Date/Time   WBC 8.1 12/28/2021 0447   RBC 3.89 (L) 12/28/2021 0447   HGB 11.3 (L) 12/28/2021 0447   HGB 12.6 (L) 12/01/2021 0817   HCT 35.2 (L)  12/28/2021 0447   PLT 323 12/28/2021 0447   PLT 369 12/01/2021 0817   MCV 90.5 12/28/2021 0447   MCH 29.0 12/28/2021 0447   MCHC 32.1 12/28/2021 0447   RDW 12.6 12/28/2021 0447   LYMPHSABS 1.1 12/28/2021 0447   MONOABS 1.0 12/28/2021 0447   EOSABS 0.3 12/28/2021 0447   BASOSABS 0.1 12/28/2021 0447    BMET    Component Value Date/Time   NA 136 12/28/2021 0447   K 4.1 12/28/2021 0447   CL 103 12/28/2021 0447   CO2 26 12/28/2021 0447   GLUCOSE 87 12/28/2021 0447   BUN 16 12/28/2021 0447   CREATININE 1.14 12/28/2021 0447   CREATININE 1.02 12/01/2021 0817   CALCIUM 8.6 (L) 12/28/2021 0447   GFRNONAA >60 12/28/2021 0447   GFRNONAA >60 12/01/2021 0817    BNP No results found for: "BNP"   Imaging:  DG Chest 2 View  Result Date: 01/23/2022 CLINICAL DATA:  Bilateral pneumonia. EXAM: CHEST - 2 VIEW COMPARISON:  December 27, 2021. FINDINGS: The heart size and mediastinal contours are within normal limits. Significantly increased left perihilar opacity is noted concerning for worsening pneumonia. Minimal right basilar atelectasis  or scarring is noted. The visualized skeletal structures are unremarkable. IMPRESSION: Significant increased left lung opacity is noted consistent with worsening pneumonia. Electronically Signed   By: Marijo Conception M.D.   On: 01/23/2022 13:25   US THYROID  Result Date: 01/13/2022 CLINICAL DATA:  46 year old male with a history thyroid nodules EXAM: THYROID ULTRASOUND TECHNIQUE: Ultrasound examination of the thyroid gland and adjacent soft tissues was performed. COMPARISON:  Chest CT 12/27/2021 FINDINGS: Parenchymal Echotexture: Mildly heterogenous Isthmus: 0.4 cm Right lobe: 6.1 cm x 2.4 cm x 2.3 cm Left lobe: 5.0 cm x 1.6 cm x 1.8 cm _________________________________________________________ Estimated total number of nodules >/= 1 cm: 2 Number of spongiform nodules >/=  2 cm not described below (TR1): 0 Number of mixed cystic and solid nodules >/= 1.5 cm not  described below ([place]): 0 _________________________________________________________ Nodule # 1: Location: Isthmus; Inferior Maximum size: 3.8 cm; Other 2 dimensions: 3.3 cm x 1.9 cm Composition: solid/almost completely solid (2) Echogenicity: isoechoic (1) Shape: not taller-than-wide (0) Margins: ill-defined (0) Echogenic foci: none (0) ACR TI-RADS total points: 3. ACR TI-RADS risk category: TR3 (3 points). ACR TI-RADS recommendations: Biopsy _________________________________________________________ Nodule # 2: Location: Left; Mid Maximum size: 0.7 cm; Other 2 dimensions: 0.6 cm x 0.5 cm Composition: cannot determine (2) Echogenicity: hypoechoic (2) Shape: not taller-than-wide (0) Margins: ill-defined (0) Echogenic foci: none (0) ACR TI-RADS total points: 3. ACR TI-RADS risk category: TR3 (3 points). ACR TI-RADS recommendations: No surveillance _________________________________________________________ Nodule # 3: Location: Left; Inferior Maximum size: 2.3 cm; Other 2 dimensions: 1.6 cm x 1.4 cm Composition: solid/almost completely solid (2) Echogenicity: hypoechoic (2) Shape: not taller-than-wide (0) Margins: cannot determine (0) Echogenic foci: none (0) ACR TI-RADS total points: 4. ACR TI-RADS risk category: TR4 (4-6 points). ACR TI-RADS recommendations: Nodule meets criteria for biopsy _________________________________________________________ No adenopathy IMPRESSION: Isthmic thyroid nodule (labeled 1, 3.8 cm) meets criteria for biopsy, as designated by the newly established ACR TI-RADS criteria, and referral for biopsy is recommended. Inferior left thyroid nodule of uncertain origin, potentially exophytic thyroid nodule versus parathyroid gland. If this represents thyroid nodule, the nodule meets criteria for biopsy, as designated by the newly established ACR TI-RADS criteria, and referral for biopsy is recommended. If further evaluation to determine whether this represents parathyroid gland is necessary,  potentially nuclear medicine sestamibi scan may be useful. Recommendations follow those established by the new ACR TI-RADS criteria (J Am Coll Radiol 8657;84:696-295). Electronically Signed   By: Corrie Mckusick D.O.   On: 01/13/2022 14:17   CT Angio Chest PE W and/or Wo Contrast  Result Date: 12/27/2021 CLINICAL DATA:  Sharp chest pain abnormal chest x-ray. EXAM: CT ANGIOGRAPHY CHEST WITH CONTRAST TECHNIQUE: Multidetector CT imaging of the chest was performed using the standard protocol during bolus administration of intravenous contrast. Multiplanar CT image reconstructions and MIPs were obtained to evaluate the vascular anatomy. RADIATION DOSE REDUCTION: This exam was performed according to the departmental dose-optimization program which includes automated exposure control, adjustment of the mA and/or kV according to patient size and/or use of iterative reconstruction technique. CONTRAST:  61m OMNIPAQUE IOHEXOL 350 MG/ML SOLN COMPARISON:  05/04/2021 FINDINGS: Cardiovascular: Satisfactory opacification of the pulmonary arteries to the segmental level. No evidence of pulmonary embolism. Normal heart size. No pericardial effusion. Mediastinum/Nodes: Nodule at the superior left mediastinum which is stable, density favoring thyroid. Lungs/Pleura: Consolidation in the lingula and especially left lower lobe. Milder right middle and lower lobe patchy airspace disease. No interlobular septal thickening, effusion, or pneumothorax. Upper Abdomen:  Post treatment changes partially covered in the left upper quadrant. 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 Review of the MIP images confirms the above findings. IMPRESSION: 1. Bilateral pneumonia, extensive at the left lung base. 2. Negative for pulmonary embolism. Electronically Signed   By: Jorje Guild M.D.   On: 12/27/2021 04:48   DG Chest 2 View  Result Date: 12/27/2021 CLINICAL DATA:  Chest pain EXAM: CHEST - 2 VIEW COMPARISON:   05/04/2021, CT from 07/26/2021 FINDINGS: Cardiac shadow is stable. Right lung is clear. Left lower lobe infiltrate is noted consistent with pneumonia. Stable expansile lesion is noted in the left seventh rib. IMPRESSION: Left basilar pneumonia Electronically Signed   By: Inez Catalina M.D.   On: 12/27/2021 02:59          No data to display          No results found for: "NITRICOXIDE"      Assessment & Plan:   Chronic cough Persistent, unresolved dry cough. He felt some better after being started on abx during his hospital stay but since, feels like his cough is unchanged. He's also still experiencing DOE. Symptoms initially started in August. Given minimal response to abx and lack of infectious symptoms as well as timeline of onset with monoclonal therapy, high suspicion for drug induced organizing pneumonia. There is still concern that this could be related to metastatic disease as well. We will obtain CT chest today for further evaluation and set him up for bronchoscopy this week.   Patient Instructions  Chest x ray looks worse today. We will obtain CT chest for further evaluation. I will call you to discuss next steps but we will likely need to move forward with bronchoscopy.   Albuterol inhaler 2 puffs every 6 hours as needed for shortness of breath or wheezing.  Flonase nasal spray 2 sprays each nostril daily   We will determine follow up after your CT chest. If symptoms do not improve or worsen, please contact office for sooner follow up or seek emergency care.    Pneumonia of both lungs due to infectious organism Lack of improvement in symptoms despite abx therapy. No significant infectious symptoms; cough is dry. See above plan.  Postnasal drip Rhinitis and postnasal drip. Possible UACS component; although, more likely related to underlying pulmonary etiology. Start flonase nasal spray for postnasal drainage control. See above.   Metastatic renal cell carcinoma to bone  (HCC) Stage IV renal cell carcinoma. Completed 4 cycles of ipilimumab/nivolumab in April 2023 and palliative radiation to the left hip. He continued nivolumab infusions, which were held in May 2023 due to suspected pneumonitis. See above. Follow up with Dr. Alen Blew as scheduled.    I spent 41 minutes of dedicated to the care of this patient on the date of this encounter to include pre-visit review of records, face-to-face time with the patient discussing conditions above, post visit ordering of testing, clinical documentation with the electronic health record, making appropriate referrals as documented, and communicating necessary findings to members of the patients care team.  Clayton Bibles, NP 01/23/2022  Pt aware and understands NP's role.

## 2022-01-23 NOTE — Addendum Note (Signed)
Addended by: Clayton Bibles on: 01/23/2022 04:46 PM   Modules accepted: Orders

## 2022-01-23 NOTE — Assessment & Plan Note (Signed)
Persistent, unresolved dry cough. He felt some better after being started on abx during his hospital stay but since, feels like his cough is unchanged. He's also still experiencing DOE. Symptoms initially started in August. Given minimal response to abx and lack of infectious symptoms as well as timeline of onset with monoclonal therapy, high suspicion for drug induced organizing pneumonia. There is still concern that this could be related to metastatic disease as well. We will obtain CT chest today for further evaluation and set him up for bronchoscopy this week.   Patient Instructions  Chest x ray looks worse today. We will obtain CT chest for further evaluation. I will call you to discuss next steps but we will likely need to move forward with bronchoscopy.   Albuterol inhaler 2 puffs every 6 hours as needed for shortness of breath or wheezing.  Flonase nasal spray 2 sprays each nostril daily   We will determine follow up after your CT chest. If symptoms do not improve or worsen, please contact office for sooner follow up or seek emergency care.

## 2022-01-23 NOTE — Assessment & Plan Note (Signed)
Rhinitis and postnasal drip. Possible UACS component; although, more likely related to underlying pulmonary etiology. Start flonase nasal spray for postnasal drainage control. See above.

## 2022-01-23 NOTE — Assessment & Plan Note (Addendum)
Stage IV renal cell carcinoma. Completed 4 cycles of ipilimumab/nivolumab in April 2023 and palliative radiation to the left hip. He continued nivolumab infusions, which were held in May 2023 due to suspected pneumonitis. See above. Follow up with Dr. Alen Blew as scheduled.

## 2022-01-23 NOTE — Assessment & Plan Note (Addendum)
Lack of improvement in symptoms despite abx therapy. No significant infectious symptoms; cough is dry. See above plan.

## 2022-01-23 NOTE — Addendum Note (Signed)
Addended by: Gavin Potters R on: 01/23/2022 04:40 PM   Modules accepted: Orders

## 2022-01-23 NOTE — Telephone Encounter (Signed)
Spoke with patient's wife and they are aware of appt info   COVID will need to be done the day of procedure   Scheduled for 11/1 at 2:15pm with [name] at [place] [facility] ID: [id number]  Letter was sent for patient reference on prep info. Thanks

## 2022-01-24 NOTE — Progress Notes (Addendum)
Patient Gordon Mcclain. Dai, MRN # [id number] has a Bronchoscopy procedure scheduled at [facility] on Wednesday, 01/25/22.  His procedure is scheduled for 2 PM - 2:45 PM with the arrival time of 11:00 am (Covid Test on DOS).   Mr. Gappa received a letter from Dr. Erin Fulling dated 01/23/22 which says that the Bronchoscopy procedure has been scheduled at [facility], Main Entrance.  Procedure date and time on letter says 11/1 2:15 PM, Arrival time of 11:45 AM.   I informed Mr. Mitch that I would call him tomorrow morning after communicating with Dr. Erin Fulling.  Patient expressed his thanks for my assistance and would look forward to my call tomorrow.  I reminded patient not to eat or drink after midnight tonight (01/24/22).

## 2022-01-24 NOTE — Telephone Encounter (Signed)
No pa req for this procedure   Called and spoke with Melissa B at [facility]  Time of call 10/31 2:36pm  FYI for documentation and precert department

## 2022-01-24 NOTE — Progress Notes (Signed)
Patient is scheduled for Bronchoscopy with doctor [name] tomorrow at 14:00 o'clock at [facility]. This Probation officer called the patient and patient states that his procedure is scheduled to [facility]. Under letters tab there is a letter sent by doctor [name] to patient today with instructions for tomorrow. Patient was instructed to be at 11:45 o'clock at [facility], Main Entrance.  Patient was informed that tomorrow the staff will follow up with doctor [name] about the location of his procedure. Patient will be informed tomorrow morning.

## 2022-01-24 NOTE — Telephone Encounter (Signed)
Please notify patient that his bronchoscopy has been changed to Thursday, 11/2 at 915 AM at [facility]. Arrive 30 min prior. Needs to get COVID testing tomorrow, 11/1 instead of today. Thanks.

## 2022-01-24 NOTE — Addendum Note (Signed)
Addended by: Priscille Kluver on: 01/24/2022 09:23 AM   Modules accepted: Orders

## 2022-01-25 ENCOUNTER — Encounter (HOSPITAL_COMMUNITY): Payer: Self-pay | Admitting: Pulmonary Disease

## 2022-01-25 ENCOUNTER — Ambulatory Visit (HOSPITAL_COMMUNITY)
Admission: RE | Admit: 2022-01-25 | Discharge: 2022-01-25 | Disposition: A | Discharge status: Home / Self Care | Payer: 59 | Attending: Pulmonary Disease | Admitting: Pulmonary Disease

## 2022-01-25 ENCOUNTER — Ambulatory Visit (HOSPITAL_COMMUNITY): Payer: 59 | Admitting: Anesthesiology

## 2022-01-25 ENCOUNTER — Ambulatory Visit (HOSPITAL_BASED_OUTPATIENT_CLINIC_OR_DEPARTMENT_OTHER): Payer: 59 | Admitting: Anesthesiology

## 2022-01-25 ENCOUNTER — Other Ambulatory Visit: Payer: Self-pay

## 2022-01-25 ENCOUNTER — Ambulatory Visit (HOSPITAL_COMMUNITY): Payer: 59

## 2022-01-25 ENCOUNTER — Encounter (HOSPITAL_COMMUNITY)
Admission: RE | Disposition: A | Discharge status: Home / Self Care | Payer: Self-pay | Source: Home / Self Care | Attending: Pulmonary Disease

## 2022-01-25 DIAGNOSIS — R053 Chronic cough: Secondary | ICD-10-CM | POA: Insufficient documentation

## 2022-01-25 DIAGNOSIS — Z87891 Personal history of nicotine dependence: Secondary | ICD-10-CM | POA: Diagnosis not present

## 2022-01-25 DIAGNOSIS — R519 Headache, unspecified: Secondary | ICD-10-CM | POA: Diagnosis not present

## 2022-01-25 DIAGNOSIS — Z1152 Encounter for screening for COVID-19: Secondary | ICD-10-CM | POA: Insufficient documentation

## 2022-01-25 DIAGNOSIS — C642 Malignant neoplasm of left kidney, except renal pelvis: Secondary | ICD-10-CM | POA: Diagnosis not present

## 2022-01-25 DIAGNOSIS — J189 Pneumonia, unspecified organism: Secondary | ICD-10-CM | POA: Insufficient documentation

## 2022-01-25 DIAGNOSIS — C7951 Secondary malignant neoplasm of bone: Secondary | ICD-10-CM | POA: Insufficient documentation

## 2022-01-25 DIAGNOSIS — J8489 Other specified interstitial pulmonary diseases: Secondary | ICD-10-CM | POA: Insufficient documentation

## 2022-01-25 DIAGNOSIS — Z905 Acquired absence of kidney: Secondary | ICD-10-CM | POA: Diagnosis not present

## 2022-01-25 HISTORY — PX: VIDEO BRONCHOSCOPY: SHX5072

## 2022-01-25 HISTORY — PX: BRONCHIAL WASHINGS: SHX5105

## 2022-01-25 HISTORY — PX: BRONCHIAL BIOPSY: SHX5109

## 2022-01-25 LAB — BODY FLUID CELL COUNT WITH DIFFERENTIAL
Eos, Fluid: 8 %
Lymphs, Fluid: 60 %
Monocyte-Macrophage-Serous Fluid: 16 % — ABNORMAL LOW (ref 50–90)
Neutrophil Count, Fluid: 0 % (ref 0–25)
Other Cells, Fluid: 16 %
Total Nucleated Cell Count, Fluid: 184 cu mm (ref 0–1000)

## 2022-01-25 LAB — SARS CORONAVIRUS 2 BY RT PCR: SARS Coronavirus 2 by RT PCR: NEGATIVE

## 2022-01-25 SURGERY — BRONCHOSCOPY, WITH FLUOROSCOPY
Anesthesia: General

## 2022-01-25 MED ORDER — LIDOCAINE 2% (20 MG/ML) 5 ML SYRINGE
INTRAMUSCULAR | Status: DC | PRN
  Administered 2022-01-25: 60 mg via INTRAVENOUS

## 2022-01-25 MED ORDER — PROPOFOL 10 MG/ML IV BOLUS
INTRAVENOUS | Status: DC | PRN
  Administered 2022-01-25: 200 mg via INTRAVENOUS

## 2022-01-25 MED ORDER — MIDAZOLAM HCL 2 MG/2ML IJ SOLN
INTRAMUSCULAR | Status: DC | PRN
  Administered 2022-01-25: 2 mg via INTRAVENOUS

## 2022-01-25 MED ORDER — FENTANYL CITRATE (PF) 250 MCG/5ML IJ SOLN
INTRAMUSCULAR | Status: DC | PRN
  Administered 2022-01-25: 100 ug via INTRAVENOUS

## 2022-01-25 MED ORDER — ROCURONIUM BROMIDE 10 MG/ML (PF) SYRINGE
PREFILLED_SYRINGE | INTRAVENOUS | Status: DC | PRN
  Administered 2022-01-25: 40 mg via INTRAVENOUS

## 2022-01-25 MED ORDER — CHLORHEXIDINE GLUCONATE 0.12 % MT SOLN
15.0000 mL | Freq: Once | OROMUCOSAL | Status: AC
  Administered 2022-01-25: 15 mL via OROMUCOSAL
  Filled 2022-01-25 (×2): qty 15

## 2022-01-25 MED ORDER — PHENYLEPHRINE HCL 0.25 % NA SOLN: 1.0000 | Freq: Four times a day (QID) | NASAL | Status: DC | PRN

## 2022-01-25 MED ORDER — DEXAMETHASONE SODIUM PHOSPHATE 10 MG/ML IJ SOLN
INTRAMUSCULAR | Status: DC | PRN
  Administered 2022-01-25: 4 mg via INTRAVENOUS

## 2022-01-25 MED ORDER — LACTATED RINGERS IV SOLN: INTRAVENOUS | Status: DC

## 2022-01-25 MED ORDER — SUGAMMADEX SODIUM 200 MG/2ML IV SOLN
INTRAVENOUS | Status: DC | PRN
  Administered 2022-01-25: 200 mg via INTRAVENOUS

## 2022-01-25 MED ORDER — ONDANSETRON HCL 4 MG/2ML IJ SOLN
INTRAMUSCULAR | Status: DC | PRN
  Administered 2022-01-25: 4 mg via INTRAVENOUS

## 2022-01-25 MED ORDER — LIDOCAINE HCL URETHRAL/MUCOSAL 2 % EX GEL: 1.0000 | Freq: Once | CUTANEOUS | Status: DC

## 2022-01-25 NOTE — Interval H&P Note (Signed)
History and Physical Interval Note:  01/25/2022 1:52 PM  Gordon Mcclain  has presented today for surgery, with the diagnosis of chronic cough.  The various methods of treatment have been discussed with the patient and family. After consideration of risks, benefits and other options for treatment, the patient has consented to  Procedure(s): VIDEO BRONCHOSCOPY WITH FLUORO (N/A) as a surgical intervention.  The patient's history has been reviewed, patient examined, no change in status, stable for surgery.  I have reviewed the patient's chart and labs.  Questions were answered to the patient's satisfaction.     [name]

## 2022-01-25 NOTE — Anesthesia Preprocedure Evaluation (Addendum)
Anesthesia Evaluation  Patient identified by MRN, date of birth, ID band Patient awake    Reviewed: Allergy & Precautions, NPO status , Patient's Chart, lab work & pertinent test results  Airway Mallampati: I  TM Distance: >3 FB Neck ROM: Full    Dental  (+) Missing   Pulmonary former smoker   Pulmonary exam normal        Cardiovascular negative cardio ROS Normal cardiovascular exam     Neuro/Psych  Headaches  negative psych ROS   GI/Hepatic negative GI ROS, Neg liver ROS,,,  Endo/Other  negative endocrine ROS    Renal/GU Renal diseaseRenal cancer     Musculoskeletal negative musculoskeletal ROS (+)    Abdominal   Peds  Hematology negative hematology ROS (+)   Anesthesia Other Findings chronic cough  Reproductive/Obstetrics                             Anesthesia Physical Anesthesia Plan  ASA: 2  GeneralAnesthesia Plan:    Post-op Pain Management:    Induction: Intravenous  2 and Ondansetron, Dexamethasone, Midazolam and Treatment may vary due to age or medical conditionPONV Risk Score and Plan:   Oral ETTAirway Management Planned:   Additional Equipment:   Intra-op Plan:   Post-operative Plan: Extubation in OR  Informed Consent: I have reviewed the patients History and Physical, chart, labs and discussed the procedure including the risks, benefits and alternatives for the proposed anesthesia with the patient or authorized representative who has indicated his/her understanding and acceptance.     Dental advisory given  Plan Discussed with: CRNA  Anesthesia Plan Comments:       Anesthesia Quick Evaluation

## 2022-01-25 NOTE — Transfer of Care (Signed)
Immediate Anesthesia Transfer of Care Note  Patient: Gordon Mcclain  Procedure(s) Performed: VIDEO BRONCHOSCOPY WITH FLUORO BRONCHIAL BIOPSIES BRONCHIAL WASHINGS  Patient Location: PACU  Anesthesia Type:General  Level of Consciousness: awake, oriented and drowsy  Airway & Oxygen Therapy: Patient Spontanous Breathing  Post-op Assessment: Report given to RN and Post -op Vital signs reviewed and stable  Post vital signs: Reviewed and stable  Last Vitals:  Vitals Value Taken Time  BP 111/69 01/25/22 1531  Temp 36.7 C 01/25/22 1531  Pulse 87 01/25/22 1535  Resp 16 01/25/22 1535  SpO2 94 % 01/25/22 1535  Vitals shown include unvalidated device data.  Last Pain:  Vitals:   01/25/22 1531  TempSrc: Tympanic  PainSc: Asleep         Complications: No notable events documented.

## 2022-01-25 NOTE — Progress Notes (Signed)
Patient was called and informed that he must be at [facility], Entrance A at 11:00 o'clock and go to the admitting office. Patient verbalized understanding. Per patient, he didn't eat or drink after midnight.

## 2022-01-25 NOTE — Op Note (Signed)
Bronchoscopy Procedure Note  Gordon Mcclain  [id number]  01-20-1976  Date:01/25/22  Time:3:51 PM   Provider Performing:Steward Sames B Claudius Mich   Procedure(s):  Flexible Bronchoscopy [phone]), Flexible bronchoscopy with bronchial alveolar lavage [phone]), Transbronchial lung biopsy, single lobe ([id number]), and Transbronchial lung biopsy, additional lobe ([id number])  Indication(s) Pneumonia, Organizing pneumonia  Consent Risks of the procedure as well as the alternatives and risks of each were explained to the patient and/or caregiver.  Consent for the procedure was obtained and is signed in the bedside chart  Anesthesia General   Time Out Verified patient identification, verified procedure, site/side was marked, verified correct patient position, special equipment/implants available, medications/allergies/relevant history reviewed, required imaging and test results available.   Sterile Technique Usual hand hygiene, masks, gowns, and gloves were used   Procedure Description Bronchoscope advanced through endotracheal tube and into airway.  Airways were examined down to subsegmental level with findings noted below.   Following diagnostic evaluation, BAL(s) performed in LUL with normal saline and return of 15m fluid and Transbronchial biopsy(s) performed under fluoroscopic guidance in LUL and LLL.  9 passes made in the LUL for cytology and tissue for microbiology. 5 Passes made in the LLL for cytology.  Findings: - Normal appearing airways   Complications/Tolerance None; patient tolerated the procedure well. Chest X-ray is needed post procedure.   EBL Minimal   Specimen(s) - LUL BAL - LUL transbronchial biopsies for cytology and microbiology - LLL transbronchial biopsies for cytology

## 2022-01-25 NOTE — Anesthesia Procedure Notes (Signed)
Procedure Name: Intubation Date/Time: 01/25/2022 1:39 PM  Performed by: Anastasio Auerbach, CRNAPre-anesthesia Checklist: Patient identified, Emergency Drugs available, Suction available and Patient being monitored Patient Re-evaluated:Patient Re-evaluated prior to induction Oxygen Delivery Method: Circle system utilized Preoxygenation: Pre-oxygenation with 100% oxygen Induction Type: IV induction Ventilation: Mask ventilation without difficulty Laryngoscope Size: Mac and 3 Grade View: Grade II Tube type: Oral Tube size: 8.5 mm Number of attempts: 1 Airway Equipment and Method: Stylet and Oral airway Placement Confirmation: ETT inserted through vocal cords under direct vision, positive ETCO2 and breath sounds checked- equal and bilateral Secured at: 23 cm Tube secured with: Tape Dental Injury: Teeth and Oropharynx as per pre-operative assessment

## 2022-01-26 ENCOUNTER — Other Ambulatory Visit: Payer: Self-pay

## 2022-01-26 ENCOUNTER — Inpatient Hospital Stay: Payer: 59 | Attending: Physician Assistant | Admitting: Oncology

## 2022-01-26 ENCOUNTER — Inpatient Hospital Stay: Payer: 59

## 2022-01-26 ENCOUNTER — Other Ambulatory Visit: Payer: Self-pay | Admitting: Family Medicine

## 2022-01-26 DIAGNOSIS — C7951 Secondary malignant neoplasm of bone: Secondary | ICD-10-CM

## 2022-01-26 DIAGNOSIS — C649 Malignant neoplasm of unspecified kidney, except renal pelvis: Secondary | ICD-10-CM

## 2022-01-26 DIAGNOSIS — Z79899 Other long term (current) drug therapy: Secondary | ICD-10-CM | POA: Insufficient documentation

## 2022-01-26 DIAGNOSIS — C642 Malignant neoplasm of left kidney, except renal pelvis: Secondary | ICD-10-CM | POA: Diagnosis not present

## 2022-01-26 DIAGNOSIS — E041 Nontoxic single thyroid nodule: Secondary | ICD-10-CM

## 2022-01-26 LAB — CBC WITH DIFFERENTIAL (CANCER CENTER ONLY)
Abs Immature Granulocytes: 0.04 10*3/uL (ref 0.00–0.07)
Basophils Absolute: 0.1 10*3/uL (ref 0.0–0.1)
Basophils Relative: 1 %
Eosinophils Absolute: 0.1 10*3/uL (ref 0.0–0.5)
Eosinophils Relative: 1 %
HCT: 38.8 % — ABNORMAL LOW (ref 39.0–52.0)
Hemoglobin: 12.8 g/dL — ABNORMAL LOW (ref 13.0–17.0)
Immature Granulocytes: 0 %
Lymphocytes Relative: 15 %
Lymphs Abs: 1.6 10*3/uL (ref 0.7–4.0)
MCH: 28.7 pg (ref 26.0–34.0)
MCHC: 33 g/dL (ref 30.0–36.0)
MCV: 87 fL (ref 80.0–100.0)
Monocytes Absolute: 0.9 10*3/uL (ref 0.1–1.0)
Monocytes Relative: 8 %
Neutro Abs: 8.1 10*3/uL — ABNORMAL HIGH (ref 1.7–7.7)
Neutrophils Relative %: 75 %
Platelet Count: 346 10*3/uL (ref 150–400)
RBC: 4.46 MIL/uL (ref 4.22–5.81)
RDW: 13.7 % (ref 11.5–15.5)
WBC Count: 10.7 10*3/uL — ABNORMAL HIGH (ref 4.0–10.5)
nRBC: 0 % (ref 0.0–0.2)

## 2022-01-26 LAB — CMP (CANCER CENTER ONLY)
ALT: 13 U/L (ref 0–44)
AST: 17 U/L (ref 15–41)
Albumin: 3.9 g/dL (ref 3.5–5.0)
Alkaline Phosphatase: 119 U/L (ref 38–126)
Anion gap: 9 (ref 5–15)
BUN: 18 mg/dL (ref 6–20)
CO2: 26 mmol/L (ref 22–32)
Calcium: 9.3 mg/dL (ref 8.9–10.3)
Chloride: 105 mmol/L (ref 98–111)
Creatinine: 1.09 mg/dL (ref 0.61–1.24)
GFR, Estimated: >60 mL/min (ref >60)
Glucose, Bld: 83 mg/dL (ref 70–99)
Potassium: 3.7 mmol/L (ref 3.5–5.1)
Sodium: 140 mmol/L (ref 135–145)
Total Bilirubin: 0.3 mg/dL (ref 0.3–1.2)
Total Protein: 7.5 g/dL (ref 6.5–8.1)

## 2022-01-26 LAB — TSH: TSH: 0.44 u[IU]/mL (ref 0.350–4.500)

## 2022-01-26 NOTE — Anesthesia Postprocedure Evaluation (Signed)
Anesthesia Post Note  Patient: Gordon Mcclain  Procedure(s) Performed: VIDEO BRONCHOSCOPY WITH FLUORO BRONCHIAL BIOPSIES BRONCHIAL WASHINGS     Patient location during evaluation: PACU Anesthesia Type: General Level of consciousness: awake and alert Pain management: pain level controlled Vital Signs Assessment: post-procedure vital signs reviewed and stable Respiratory status: spontaneous breathing, nonlabored ventilation, respiratory function stable and patient connected to nasal cannula oxygen Cardiovascular status: blood pressure returned to baseline and stable Postop Assessment: no apparent nausea or vomiting Anesthetic complications: no   No notable events documented.  Last Vitals:  Vitals:   01/25/22 1541 01/25/22 1551  BP: 105/64 114/81  Pulse: 87 78  Resp: 20 15  Temp:    SpO2: 92% 93%    Last Pain:  Vitals:   01/25/22 1601  TempSrc:   PainSc: 0-No pain                 Santa Lighter

## 2022-01-26 NOTE — Progress Notes (Signed)
Hematology and Oncology Follow Up Visit  [name] [id number] December 19, 1975 46 y.o. 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   Prior Therapy:  He is status post left hip surgery completed in March 2023.  He underwent left hemiarthroplasty at that time.  Palliative radiation therapy to the left ribs with total of 30 Gray in 10 fractions completed in February 2023.  Ipilimumab 1 mg/kg and nivolumab 3 mg/kg started on May 18, 2021.  He completed 4 cycles of therapy on July 20, 2021.  Radiation therapy to the left hip for a total of 30 Gray in 10 fractions completed in May 2023.  He is status post robotic assisted left radical nephrectomy completed on Aug 02, 2021.  The final pathology showed predominantly extensive necrosis with 10% of the tumor cells viable indicating T1b.   Current therapy: Nivolumab 480 mg monthly maintenance started on Aug 24, 2021.  Therapy has been on hold due to presumed pneumonitis.     Interim History: Mr. Gordon Mcclain presents today for a follow-up.  Since the last visit, he continues to have persistent cough and dyspnea on exertion despite completing course of antibiotics.  He was evaluated by pulmonary medicine and CT scan of the chest showed findings of suspicious of organizing pneumonia on October 30.  He underwent bronchoscopy yesterday.  Clinically, he reports continues to have chronic cough with very minimal dyspnea on exertion.  Continues to be active and attends activities of daily living.  He was able to park far and walk without any major issues.   Medications: Updated on review. Current Outpatient Medications  Medication Sig Dispense Refill   acetaminophen (TYLENOL) 500 MG tablet Take 1,000 mg by mouth every 6 (six) hours as needed for moderate pain or  headache.     albuterol (VENTOLIN HFA) 108 (90 Base) MCG/ACT inhaler Inhale 2 puffs into the lungs every 6 (six) hours as needed for wheezing or shortness of breath. 8 g 6   fluticasone (FLONASE) 50 MCG/ACT nasal spray Place 2 sprays into both nostrils daily. (Patient taking differently: Place 2 sprays into both nostrils daily as needed (dry cough).) 18.2 mL 2   prochlorperazine (COMPAZINE) 10 MG tablet Take 1 tablet (10 mg total) by mouth every 6 (six) hours as needed for nausea or vomiting. (Patient not taking: Reported on 01/24/2022) 30 tablet 0   No current facility-administered medications for this visit.     Allergies:  Allergies  Allergen Reactions   Opdivo [Nivolumab] Other (See Comments)    Acute lower back pain - see progress note from 07/20/2021 and 09/29/21 Patient able to complete infusion with additional fluids run concurrently.      Physical Exam:     Blood pressure 116/71, pulse 82, temperature 97.9 F (36.6 C), temperature source Temporal, resp. rate 18, height '5\' 5"'$  (1.651 m), weight 184 lb 14.4 oz (83.9 kg), SpO2 95 %.    ECOG: 1    General appearance: Comfortable appearing without any discomfort Head: Normocephalic without any trauma Oropharynx: Mucous membranes are moist and pink without any thrush or ulcers. Eyes: Pupils are equal and round reactive to light. Lymph nodes: No cervical, supraclavicular, inguinal or axillary lymphadenopathy.   Heart:regular rate and rhythm.  S1 and S2 without leg edema. Lung: Clear without any rhonchi or wheezes.  No dullness to percussion. Abdomin: Soft,  nontender, nondistended with good bowel sounds.  No hepatosplenomegaly. Musculoskeletal: No joint deformity or effusion.  Full range of motion noted. Neurological: No deficits noted on motor, sensory and deep tendon reflex exam. Skin: No petechial rash or dryness.  Appeared moist.        Lab Results: Lab Results  Component Value Date   WBC 8.1 12/28/2021   HGB  11.3 (L) 12/28/2021   HCT 35.2 (L) 12/28/2021   MCV 90.5 12/28/2021   PLT 323 12/28/2021     Chemistry      Component Value Date/Time   NA 136 12/28/2021 0447   K 4.1 12/28/2021 0447   CL 103 12/28/2021 0447   CO2 26 12/28/2021 0447   BUN 16 12/28/2021 0447   CREATININE 1.14 12/28/2021 0447   CREATININE 1.02 12/01/2021 0817      Component Value Date/Time   CALCIUM 8.6 (L) 12/28/2021 0447   ALKPHOS 117 12/27/2021 0338   AST 19 12/27/2021 0338   AST 12 (L) 12/01/2021 0817   ALT 19 12/27/2021 0338   ALT 11 12/01/2021 0817   BILITOT 0.6 12/27/2021 0338   BILITOT 0.5 12/01/2021 0817          Impression and Plan:   48 year old with:  1.  Kidney cancer diagnosed in January 2023.  He was found to have stage IV intermediate risk clear-cell with adenopathy and bone involvement.  The natural course of this disease was discussed.  Treatment options were reviewed.  Nivolumab has been on hold due to suspicious for organizing pneumonia related to immunotherapy.  Different treatment options such as oral targeted therapy will be deferred if he has metastatic disease.  I recommended updating his imaging studies of the abdomen in the next 4 weeks.   2.  Chest pain and shortness of breath: Findings are suspicious for organizing pneumonia.  He is following with pulmonary medicine and underwent bronchoscopy.  Trial of steroid may be needed and will defer that to pulmonary medicine discretion.  His overall respiratory status appears to be stable may be improving.    4.  Immunotherapy surveillance: He has not experienced any other autoimmune complications at this time.    5.   Follow-up: In the next 4 to 6 weeks for repeat follow-up.     30  minutes were d spent on this encounter.  The time was dedicated to reviewing laboratory data, disease status update and outlining future plan of care discussion.   Zola Button, MD 11/2/202312:52 PM

## 2022-01-27 LAB — ACID FAST SMEAR (AFB, MYCOBACTERIA)
Acid Fast Smear: NEGATIVE
Acid Fast Smear: NEGATIVE

## 2022-01-27 LAB — T4: T4, Total: 10 ug/dL (ref 4.5–12.0)

## 2022-01-27 LAB — CYTOLOGY - NON PAP

## 2022-01-28 LAB — CULTURE, RESPIRATORY W GRAM STAIN
Culture: NO GROWTH
Gram Stain: NONE SEEN

## 2022-01-29 ENCOUNTER — Encounter (HOSPITAL_COMMUNITY): Payer: Self-pay | Admitting: Pulmonary Disease

## 2022-01-30 ENCOUNTER — Encounter: Payer: Self-pay | Admitting: Pulmonary Disease

## 2022-01-30 ENCOUNTER — Ambulatory Visit (INDEPENDENT_AMBULATORY_CARE_PROVIDER_SITE_OTHER): Payer: 59 | Admitting: Pulmonary Disease

## 2022-01-30 VITALS — BP 110/66 | HR 78 | Temp 98.0°F | Ht 65.0 in | Wt 182.4 lb

## 2022-01-30 DIAGNOSIS — J8489 Other specified interstitial pulmonary diseases: Secondary | ICD-10-CM

## 2022-01-30 LAB — CYTOLOGY - NON PAP

## 2022-01-30 MED ORDER — PREDNISONE 20 MG PO TABS: ORAL_TABLET | ORAL | 0 refills | Status: DC

## 2022-01-30 MED ORDER — SULFAMETHOXAZOLE-TRIMETHOPRIM 800-160 MG PO TABS: 1.0000 | ORAL_TABLET | ORAL | 4 refills | Status: DC

## 2022-01-30 NOTE — Patient Instructions (Addendum)
Start prednisone taper as follows: 60 mg daily 11/6 - 11/20  40 mg daily 11/21 - 12/19  30 mg daily 12/20 - 1/17  20 mg daily 1/18 - 2/15 '10mg'$  daily 3/16 - 3/15  Start bactrim DS 1 tab 3 days per week until we are under '20mg'$  per day of prednisone.  Follow up in 4 weeks

## 2022-01-30 NOTE — Progress Notes (Signed)
Synopsis: Referred in October 2023 for hospital follow up for pneumonia  Subjective:   PATIENT ID: Gordon Mcclain GENDER: male DOB: 1976-03-07, MRN: [id number]  HPI  Chief Complaint  Patient presents with   [place] Had Chest X-Ray PT states still has chest pain, breathing is not better since being released from hospital   Gordon Mcclain is a 46 year old male, former smoker with renal cell cancer with metastasis to the bone status post left nephrectomy and total hip arthoplasty status post treatment with nivolumab who returns to clinic for follow up after bronchoscopy.  He was admitted 10/3 to 10/4 for pneumonia with dense consolidations of the left lower lobe and small opacities in the right lower lobe periphery concerning for infectious pneumonia vs organizing pneumoina/pneumonitis in setting of nivolumab vs cancer progression. He was initially treated with antibiotics and felt improved. He followed up on 10/30 with Roxan Diesel, NP where he did not feel much different after completing the antibiotics with on going cough.  Repeat CT Chest scan 01/23/22 showed progressive multilobar consolidative and ground glass opacities.   He was taken for bronchoscopy 01/25/22 where LUL and LLL transbronchial biopsies were performed along with LUL BAL. Transbronchial biopsies are negative for malignancy and I am awaiting response from pathology whether inflammation was noted. Cultures are negative to date. BAL cell count and differential showed lymphocyte predominance and 8% eosinophils.  Results were reviewed with patient with main concern for organizing pneumonia related to nivolumab.   Past Medical History:  Diagnosis Date   Frequent headaches    occipital region mostly: takes ibuprofen 800 mg bid most days   Palpitations    Zio patch reassuring 04/2021   Renal cancer ([place]) 03/2021   Left->presented with bone met to L rib.   Renal mass 08/02/2021   Renal mass, left  04/20/2021   Tobacco dependence      Family History  Problem Relation Age of Onset   Arthritis Mother    Cancer Maternal Aunt    Heart attack Maternal Uncle    Heart attack Maternal Grandmother    Heart attack Maternal Grandfather    Heart attack Maternal Aunt      Social History   Socioeconomic History   Marital status: Married    Spouse name: Not on file   Number of children: Not on file   Years of education: Not on file   Highest education level: Not on file  Occupational History   Not on file  Tobacco Use   Smoking status: Former    Packs/day: 1.00    Years: 20.00    Total pack years: 20.00    Types: Cigarettes    Quit date: 04/27/2021    Years since quitting: 0.7    Passive exposure: Never   Smokeless tobacco: Never  Vaping Use   Vaping Use: Never used  Substance and Sexual Activity   Alcohol use: No   Drug use: No   Sexual activity: Yes    Partners: Female  Other Topics Concern   Not on file  Social History Narrative   Married, 1 daughter and one son.   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   Orig from [place], [place] area.   Education: HS.   Tob 20  pck-yr hx (current as of 03/2014).   Alc: none.  No hx of drug use.   Caffeine: 3-4 cups coffee/day, 1 cup tea per day.   No exercise.   Social  Determinants of Health   Financial Resource Strain: Not on file  Food Insecurity: No Food Insecurity (12/27/2021)   Hunger Vital Sign    Worried About Running Out of Food in the Last Year: Never true    Ran Out of Food in the Last Year: Never true  Transportation Needs: No Transportation Needs (12/27/2021)   PRAPARE - Hydrologist (Medical): No    Lack of Transportation (Non-Medical): No  Physical Activity: Not on file  Stress: Not on file  Social Connections: Not on file  Intimate Partner Violence: Not At Risk (12/27/2021)   Humiliation, Afraid, Rape, and Kick questionnaire    Fear of Current or  Ex-Partner: No    Emotionally Abused: No    Physically Abused: No    Sexually Abused: No     Allergies  Allergen Reactions   Opdivo [Nivolumab] Other (See Comments)    Acute lower back pain - see progress note from 07/20/2021 and 09/29/21 Patient able to complete infusion with additional fluids run concurrently.      Outpatient Medications Prior to Visit  Medication Sig Dispense Refill   acetaminophen (TYLENOL) 500 MG tablet Take 1,000 mg by mouth every 6 (six) hours as needed for moderate pain or headache.     albuterol (VENTOLIN HFA) 108 (90 Base) MCG/ACT inhaler Inhale 2 puffs into the lungs every 6 (six) hours as needed for wheezing or shortness of breath. 8 g 6   fluticasone (FLONASE) 50 MCG/ACT nasal spray Place 2 sprays into both nostrils daily. (Patient taking differently: Place 2 sprays into both nostrils daily as needed (dry cough).) 18.2 mL 2   prochlorperazine (COMPAZINE) 10 MG tablet Take 1 tablet (10 mg total) by mouth every 6 (six) hours as needed for nausea or vomiting. (Patient not taking: Reported on 01/24/2022) 30 tablet 0   No facility-administered medications prior to visit.    Review of Systems  Constitutional:  Negative for chills, fever, malaise/fatigue and weight loss.  HENT:  Negative for congestion, sinus pain and sore throat.   Eyes: Negative.   Respiratory:  Positive for cough. Negative for hemoptysis, sputum production, shortness of breath and wheezing.   Cardiovascular:  Negative for chest pain, palpitations, orthopnea, claudication and leg swelling.  Gastrointestinal:  Negative for abdominal pain, heartburn, nausea and vomiting.  Genitourinary: Negative.   Musculoskeletal:  Negative for joint pain and myalgias.  Skin:  Negative for rash.  Neurological:  Negative for weakness.  Endo/Heme/Allergies: Negative.   Psychiatric/Behavioral: Negative.     Objective:   Vitals:   01/30/22 0925  BP: 110/66  Pulse: 78  Temp: 98 F (36.7 C)  TempSrc: Oral   SpO2: 99%  Weight: 182 lb 6.4 oz (82.7 kg)  Height: _0  (1.651 m)     Physical Exam Constitutional:      General: He is not in acute distress. HENT:     Head: Normocephalic and atraumatic.  Eyes:     Extraocular Movements: Extraocular movements intact.     Conjunctiva/sclera: Conjunctivae normal.     Pupils: Pupils are equal, round, and reactive to light.  Cardiovascular:     Rate and Rhythm: Normal rate and regular rhythm.     Pulses: Normal pulses.     Heart sounds: Normal heart sounds. No murmur heard. Pulmonary:     Breath sounds: No wheezing, rhonchi or rales.     Comments: Bronchial breath sounds left lower lobe Abdominal:     General: Bowel sounds are  normal.     Palpations: Abdomen is soft.  Musculoskeletal:     Right lower leg: No edema.     Left lower leg: No edema.  Lymphadenopathy:     Cervical: No cervical adenopathy.  Skin:    General: Skin is warm and dry.  Neurological:     General: No focal deficit present.     Mental Status: He is alert.  Psychiatric:        Mood and Affect: Mood normal.        Behavior: Behavior normal.        Thought Content: Thought content normal.        Judgment: Judgment normal.    CBC    Component Value Date/Time   WBC 10.7 (H) 01/26/2022 1300   WBC 8.1 12/28/2021 0447   RBC 4.46 01/26/2022 1300   HGB 12.8 (L) 01/26/2022 1300   HCT 38.8 (L) 01/26/2022 1300   PLT 346 01/26/2022 1300   MCV 87.0 01/26/2022 1300   MCH 28.7 01/26/2022 1300   MCHC 33.0 01/26/2022 1300   RDW 13.7 01/26/2022 1300   LYMPHSABS 1.6 01/26/2022 1300   MONOABS 0.9 01/26/2022 1300   EOSABS 0.1 01/26/2022 1300   BASOSABS 0.1 01/26/2022 1300      Latest Ref Rng & Units 01/26/2022    1:00 PM 12/28/2021    4:47 AM 12/27/2021    2:09 AM  BMP  Glucose 70 - 99 mg/dL 83  87  105   BUN 6 - 20 mg/dL _0 Creatinine 0.61 - 1.24 mg/dL 1.09  1.14  0.99   Sodium 135 - 145 mmol/L 140  136  135   Potassium 3.5 - 5.1 mmol/L 3.7  4.1  4.3    Chloride 98 - 111 mmol/L 105  103  104   CO2 22 - 32 mmol/L _1 Calcium 8.9 - 10.3 mg/dL 9.3  8.6  8.9     Chest imaging: CT Chest 01/23/22 Mediastinum/Nodes: Mildly hypoattenuating exophytic nodule off the thyroid isthmus measures 1.6 x 2.8 cm, previously evaluated by ultrasound 01/13/2022. No followup recommended. (Ref: J Am Coll Radiol. 2015 Feb;12(2): 143-50).Mediastinal lymph nodes are not enlarged by CT size criteria. Hilar regions are difficult to evaluate without IV contrast. No axillary adenopathy. Esophagus is grossly unremarkable.   Lungs/Pleura: Worsening multi lobar consolidation and ground-glass, left greater than right. Small left pleural effusion. Airway is unremarkable.  PFT:     No data to display          Labs:  Path:  Echo:  Heart Catheterization:  Assessment & Plan:   Organizing pneumonia ([place]) - Plan: predniSONE (DELTASONE) 20 MG tablet, sulfamethoxazole-trimethoprim (BACTRIM DS) 800-160 MG tablet  Discussion: Casten Floren is a 46 year old male, former smoker with renal cell cancer with metastasis to the bone status post left nephrectomy and total hip arthoplasty status post treatment with nivolumab who returns to clinic for follow up after bronchoscopy.  We will start prednisone taper as follows for treatment of organizing pneumonia based on BAL results. Will follow up with pathology whether there any evidence for organizing pneumonia based on transbronchial biopsies. BAL cultures are negative to date and cytology is negative for malignancy.  60 mg daily 11/6 - 11/20  40 mg daily 11/21 - 12/19  30 mg daily 12/20 - 1/17  20 mg daily 1/18 - 2/15 26m daily 3/16 - 3/15  Start bactrim DS 1 tab 3  days per week until we are under 15m per day of prednisone.  Follow up in 4 weeks.   JFreda Jackson MD [facility] Office: [phone]   Current Outpatient Medications:    predniSONE (DELTASONE) 20 MG  tablet, Take 3 tablets (60 mg total) by mouth daily with breakfast for 14 days, THEN 2 tablets (40 mg total) daily with breakfast for 30 days, THEN 1.5 tablets (30 mg total) daily with breakfast for 30 days, THEN 1 tablet (20 mg total) daily with breakfast for 30 days, THEN 0.5 tablets (10 mg total) daily with breakfast., Disp: 42 tablet, Rfl: 0   sulfamethoxazole-trimethoprim (BACTRIM DS) 800-160 MG tablet, Take 1 tablet by mouth 3 (three) times a week., Disp: 12 tablet, Rfl: 4   acetaminophen (TYLENOL) 500 MG tablet, Take 1,000 mg by mouth every 6 (six) hours as needed for moderate pain or headache., Disp: , Rfl:    albuterol (VENTOLIN HFA) 108 (90 Base) MCG/ACT inhaler, Inhale 2 puffs into the lungs every 6 (six) hours as needed for wheezing or shortness of breath., Disp: 8 g, Rfl: 6   fluticasone (FLONASE) 50 MCG/ACT nasal spray, Place 2 sprays into both nostrils daily. (Patient taking differently: Place 2 sprays into both nostrils daily as needed (dry cough).), Disp: 18.2 mL, Rfl: 2   prochlorperazine (COMPAZINE) 10 MG tablet, Take 1 tablet (10 mg total) by mouth every 6 (six) hours as needed for nausea or vomiting. (Patient not taking: Reported on 01/24/2022), Disp: 30 tablet, Rfl: 0

## 2022-02-09 ENCOUNTER — Other Ambulatory Visit: Payer: Self-pay

## 2022-02-09 ENCOUNTER — Encounter: Payer: Self-pay | Admitting: Pulmonary Disease

## 2022-02-09 ENCOUNTER — Other Ambulatory Visit: Payer: Self-pay | Admitting: Pulmonary Disease

## 2022-02-09 DIAGNOSIS — J8489 Other specified interstitial pulmonary diseases: Secondary | ICD-10-CM

## 2022-02-09 DIAGNOSIS — E041 Nontoxic single thyroid nodule: Secondary | ICD-10-CM

## 2022-02-09 MED ORDER — PREDNISONE 20 MG PO TABS: ORAL_TABLET | ORAL | 0 refills | Status: DC

## 2022-02-10 ENCOUNTER — Ambulatory Visit
Admission: RE | Admit: 2022-02-10 | Discharge: 2022-02-10 | Disposition: A | Discharge status: Home / Self Care | Payer: 59 | Source: Ambulatory Visit | Attending: Family Medicine | Admitting: Family Medicine

## 2022-02-10 ENCOUNTER — Telehealth: Payer: Self-pay

## 2022-02-10 ENCOUNTER — Other Ambulatory Visit (HOSPITAL_COMMUNITY)
Admission: RE | Admit: 2022-02-10 | Discharge: 2022-02-10 | Disposition: A | Discharge status: Home / Self Care | Payer: 59 | Source: Ambulatory Visit | Attending: Family Medicine | Admitting: Family Medicine

## 2022-02-10 ENCOUNTER — Other Ambulatory Visit: Payer: Self-pay | Admitting: Family Medicine

## 2022-02-10 DIAGNOSIS — C7951 Secondary malignant neoplasm of bone: Secondary | ICD-10-CM | POA: Insufficient documentation

## 2022-02-10 DIAGNOSIS — E042 Nontoxic multinodular goiter: Secondary | ICD-10-CM | POA: Insufficient documentation

## 2022-02-10 DIAGNOSIS — C642 Malignant neoplasm of left kidney, except renal pelvis: Secondary | ICD-10-CM | POA: Insufficient documentation

## 2022-02-10 DIAGNOSIS — E041 Nontoxic single thyroid nodule: Secondary | ICD-10-CM

## 2022-02-10 NOTE — Telephone Encounter (Signed)
Dr. Ivan Croft fyi- Juliann Pulse with [facility] called - has patient on the table for thyroid biopsy at time of call.  States only one site was ordered -the inferior isthmus but ultrasound suggested two sites.  They are wanting to know if they should do both sites or just one. Dr. Zigmund Daniel was not in the office at time of call.  Spoke with Dr. Madilyn Fireman  who gave authorization to also biopsy the left inferior thyroid.  Gave verbal order for both locations - was told that was necessary to have in epic ordered as well. Dr. Suzi Roots completed this as well.

## 2022-02-11 ENCOUNTER — Telehealth: Payer: Self-pay | Admitting: Oncology

## 2022-02-11 NOTE — Telephone Encounter (Signed)
Called patient regarding December appointments, patient is notified.  

## 2022-02-13 NOTE — Telephone Encounter (Signed)
Kim-Thanks for getting this taken care of for the patient! Dr. Madilyn Fireman- Thanks for placing additional order!

## 2022-02-15 LAB — CYTOLOGY - NON PAP

## 2022-02-20 ENCOUNTER — Telehealth: Payer: Self-pay | Admitting: Oncology

## 2022-02-20 NOTE — Telephone Encounter (Signed)
Called patient regarding December appointments, patient is notified.  

## 2022-02-24 LAB — FUNGAL ORGANISM REFLEX

## 2022-02-24 LAB — FUNGUS CULTURE RESULT

## 2022-02-24 LAB — FUNGUS CULTURE WITH STAIN

## 2022-03-01 ENCOUNTER — Encounter: Payer: Self-pay | Admitting: Pulmonary Disease

## 2022-03-01 ENCOUNTER — Ambulatory Visit (INDEPENDENT_AMBULATORY_CARE_PROVIDER_SITE_OTHER): Payer: 59 | Admitting: Pulmonary Disease

## 2022-03-01 VITALS — BP 120/78 | HR 77 | Ht 66.0 in | Wt 193.0 lb

## 2022-03-01 DIAGNOSIS — J8489 Other specified interstitial pulmonary diseases: Secondary | ICD-10-CM

## 2022-03-01 MED ORDER — PREDNISONE 10 MG PO TABS: 10.0000 mg | ORAL_TABLET | Freq: Every day | ORAL | 1 refills | Status: DC

## 2022-03-01 NOTE — Progress Notes (Signed)
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  He was started on prednisone taper after last visit once biopsy results were confirmed with pathology indicating organizing pneumonia.   He was started on prednisone 72m daily for 1 month and has tapered down to 45mdaily 11/21. He reports feeling much better. His cough has resolved and he is able to take deeper breaths.   Initial OV 01/30/22 He was admitted 10/3 to 10/4 for pneumonia with dense consolidations of the left lower lobe and small opacities in the right lower lobe periphery concerning for infectious pneumonia vs organizing pneumoina/pneumonitis in setting of nivolumab vs cancer progression. He was initially treated with antibiotics and felt improved. He followed up on 10/30 with KaRoxan DieselNP where he did not feel much different after completing the antibiotics with on going cough.  Repeat CT Chest scan 01/23/22 showed progressive multilobar consolidative and ground glass opacities.   He was taken for bronchoscopy 01/25/22 where LUL and LLL transbronchial biopsies were performed along with LUL BAL. Transbronchial biopsies are negative for malignancy and I am awaiting response from pathology whether inflammation was noted. Cultures are negative to date. BAL cell count and differential showed lymphocyte predominance and 8% eosinophils.  Results were reviewed with patient with main concern for organizing pneumonia related to nivolumab.   Past Medical History:  Diagnosis Date   Frequent headaches     occipital region mostly: takes ibuprofen 800 mg bid most days   Palpitations    Zio patch reassuring 04/2021   Renal cancer ([facility]/20[facility]   Left->presented with bone met to L rib.   Renal mass 08/02/2021   Renal mass, left 04/20/2021   Tobacco dependence      Family History  Problem Relation Age of Onset   Arthritis Mother    Cancer Maternal Aunt    Heart attack Maternal Uncle    Heart attack Maternal Grandmother    Heart attack Maternal Grandfather    Heart attack Maternal Aunt      Social History   Socioeconomic History   Marital status: Married    Spouse name: Not on file   Number of children: Not on file   Years of education: Not on file   Highest education level: Not on file  Occupational History   Not on file  Tobacco Use   Smoking status: Former    Packs/day: 1.00    Years: 20.00    Total pack years: 20.00    Types: Cigarettes    Quit date: 04/27/2021    Years since quitting: 0.8    Passive exposure: Never   Smokeless tobacco: Never  Vaping Use   Vaping Use: Never used  Substance and Sexual Activity   Alcohol use: No   Drug use: No   Sexual activity: Yes    Partners: Female  Other Topics Concern   Not on file  Social History Narrative   Married, 1 daughter and one son.   Occupation; [employer]   [place]   Education: HS.  Tob 20  pck-yr hx (current as of 03/2014).   Alc: none.  No hx of drug use.   Caffeine: 3-4 cups coffee/day, 1 cup tea per day.   No exercise.   Social Determinants of Health   Financial Resource Strain: Not on file  Food Insecurity: No Food Insecurity (12/27/2021)   Hunger Vital Sign    Worried About Running Out of Food in the Last Year: Never true    Ran Out of Food in the Last Year: Never true  Transportation Needs: No Transportation Needs (12/27/2021)   PRAPARE - Hydrologist (Medical): No    Lack of Transportation  (Non-Medical): No  Physical Activity: Not on file  Stress: Not on file  Social Connections: Not on file  Intimate Partner Violence: Not At Risk (12/27/2021)   Humiliation, Afraid, Rape, and Kick questionnaire    Fear of Current or Ex-Partner: No    Emotionally Abused: No    Physically Abused: No    Sexually Abused: No     Allergies  Allergen Reactions   Opdivo [Nivolumab] Other (See Comments)    Acute lower back pain - see progress note from 07/20/2021 and 09/29/21 Patient able to complete infusion with additional fluids run concurrently.      Outpatient Medications Prior to Visit  Medication Sig Dispense Refill   acetaminophen (TYLENOL) 500 MG tablet Take 1,000 mg by mouth every 6 (six) hours as needed for moderate pain or headache.     albuterol (VENTOLIN HFA) 108 (90 Base) MCG/ACT inhaler Inhale 2 puffs into the lungs every 6 (six) hours as needed for wheezing or shortness of breath. 8 g 6   fluticasone (FLONASE) 50 MCG/ACT nasal spray Place 2 sprays into both nostrils daily. (Patient taking differently: Place 2 sprays into both nostrils daily as needed (dry cough).) 18.2 mL 2   predniSONE (DELTASONE) 20 MG tablet Take 3 tablets (60 mg total) by mouth daily with breakfast for 14 days, THEN 2 tablets (40 mg total) daily with breakfast for 30 days, THEN 1.5 tablets (30 mg total) daily with breakfast for 30 days, THEN 1 tablet (20 mg total) daily with breakfast for 30 days, THEN 0.5 tablets (10 mg total) daily with breakfast. 135 tablet 0   prochlorperazine (COMPAZINE) 10 MG tablet Take 1 tablet (10 mg total) by mouth every 6 (six) hours as needed for nausea or vomiting. 30 tablet 0   sulfamethoxazole-trimethoprim (BACTRIM DS) 800-160 MG tablet Take 1 tablet by mouth 3 (three) times a week. 12 tablet 4   No facility-administered medications prior to visit.    Review of Systems  Constitutional:  Negative for chills, fever, malaise/fatigue and weight loss.  HENT:  Negative for congestion,  sinus pain and sore throat.   Eyes: Negative.   Respiratory:  Negative for cough, hemoptysis, sputum production, shortness of breath and wheezing.   Cardiovascular:  Negative for chest pain, palpitations, orthopnea, claudication and leg swelling.  Gastrointestinal:  Negative for abdominal pain, heartburn, nausea and vomiting.  Genitourinary: Negative.   Musculoskeletal:  Negative for joint pain and myalgias.  Skin:  Negative for rash.  Neurological:  Negative for weakness.  Endo/Heme/Allergies: Negative.   Psychiatric/Behavioral: Negative.     Objective:   Vitals:   03/01/22 1308  BP: 120/78  Pulse: 77  SpO2: 99%  Weight: 193 lb (87.5 kg)  Height: _0  (1.676 m)     Physical Exam Constitutional:      General: He is not  in acute distress. HENT:     Head: Normocephalic and atraumatic.  Eyes:     Conjunctiva/sclera: Conjunctivae normal.  Cardiovascular:     Rate and Rhythm: Normal rate and regular rhythm.     Pulses: Normal pulses.     Heart sounds: Normal heart sounds. No murmur heard. Pulmonary:     Breath sounds: No wheezing, rhonchi or rales.  Musculoskeletal:     Right lower leg: No edema.     Left lower leg: No edema.  Lymphadenopathy:     Cervical: No cervical adenopathy.  Skin:    General: Skin is warm and dry.  Neurological:     General: No focal deficit present.     Mental Status: He is alert.    CBC    Component Value Date/Time   WBC 9.2 03/02/2022 1304   WBC 8.1 12/28/2021 0447   RBC 5.07 03/02/2022 1304   HGB 15.0 03/02/2022 1304   HCT 45.0 03/02/2022 1304   PLT 271 03/02/2022 1304   MCV 88.8 03/02/2022 1304   MCH 29.6 03/02/2022 1304   MCHC 33.3 03/02/2022 1304   RDW 16.1 (H) 03/02/2022 1304   LYMPHSABS 0.4 (L) 03/02/2022 1304   MONOABS 0.2 03/02/2022 1304   EOSABS 0.0 03/02/2022 1304   BASOSABS 0.1 03/02/2022 1304      Latest Ref Rng & Units 03/02/2022    1:04 PM 01/26/2022    1:00 PM 12/28/2021    4:47 AM  BMP  Glucose 70 - 99 mg/dL  133  83  87   BUN 6 - 20 mg/dL 32  18  16   Creatinine 0.61 - 1.24 mg/dL 1.00  1.09  1.14   Sodium 135 - 145 mmol/L 136  140  136   Potassium 3.5 - 5.1 mmol/L 4.7  3.7  4.1   Chloride 98 - 111 mmol/L 102  105  103   CO2 22 - 32 mmol/L _0 Calcium 8.9 - 10.3 mg/dL 9.8  9.3  8.6     Chest imaging: CT Chest 01/23/22 Mediastinum/Nodes: Mildly hypoattenuating exophytic nodule off the thyroid isthmus measures 1.6 x 2.8 cm, previously evaluated by ultrasound 01/13/2022. No followup recommended. (Ref: J Am Coll Radiol. 2015 Feb;12(2): 143-50).Mediastinal lymph nodes are not enlarged by CT size criteria. Hilar regions are difficult to evaluate without IV contrast. No axillary adenopathy. Esophagus is grossly unremarkable.   Lungs/Pleura: Worsening multi lobar consolidation and ground-glass, left greater than right. Small left pleural effusion. Airway is unremarkable.  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  He is to continue steroid taper as below.  60 mg daily 11/6 - 11/20  40 mg daily 11/21 - 12/19  30 mg daily 12/20 - 1/17  20 mg daily 1/18 - 2/15 36m daily 3/16 - 3/15  Continue bactrim DS 1 tab 3 days per week until we are under 224mper day of prednisone.  Follow up in 2 months.  JoFreda JacksonMD [facility] Office: [phone]  Current Outpatient Medications:    acetaminophen (TYLENOL) 500 MG tablet, Take 1,000 mg by mouth every 6 (six) hours as needed for moderate pain or headache., Disp: , Rfl:    albuterol  (VENTOLIN HFA) 108 (90 Base) MCG/ACT inhaler, Inhale 2 puffs into the lungs every 6 (six) hours as needed for wheezing or shortness of breath., Disp: 8 g, Rfl: 6   fluticasone (FLONASE) 50 MCG/ACT nasal spray, Place 2 sprays into both nostrils daily. (Patient taking differently: Place 2 sprays into both nostrils daily as needed (dry cough).), Disp: 18.2 mL, Rfl: 2   predniSONE (DELTASONE) 10 MG tablet, Take 1 tablet (10 mg total) by mouth daily with breakfast., Disp: 30 tablet, Rfl: 1   predniSONE (DELTASONE) 20 MG tablet, Take 3 tablets (60 mg total) by mouth daily with breakfast for 14 days, THEN 2 tablets (40 mg total) daily with breakfast for 30 days, THEN 1.5 tablets (30 mg total) daily with breakfast for 30 days, THEN 1 tablet (20 mg total) daily with breakfast for 30 days, THEN 0.5 tablets (10 mg total) daily with breakfast., Disp: 135 tablet, Rfl: 0   prochlorperazine (COMPAZINE) 10 MG tablet, Take 1 tablet (10 mg total) by mouth every 6 (six) hours as needed for nausea or vomiting., Disp: 30 tablet, Rfl: 0   sulfamethoxazole-trimethoprim (BACTRIM DS) 800-160 MG tablet, Take 1 tablet by mouth 3 (three) times a week., Disp: 12 tablet, Rfl: 4

## 2022-03-01 NOTE — Patient Instructions (Addendum)
Continue Steroid taper 40 mg daily 11/21 - 12/19  30 mg daily 12/20 - 1/17  20 mg daily 1/18 - 2/15 '10mg'$  daily 3/16 - 3/15  Contine bactrim DS 1 tab 3 days per week until we are under '20mg'$  per day of prednisone.  Follow up in 2 months

## 2022-03-02 ENCOUNTER — Other Ambulatory Visit: Payer: 59

## 2022-03-02 ENCOUNTER — Inpatient Hospital Stay: Payer: 59 | Attending: Physician Assistant

## 2022-03-02 ENCOUNTER — Other Ambulatory Visit: Payer: Self-pay

## 2022-03-02 ENCOUNTER — Ambulatory Visit (HOSPITAL_COMMUNITY)
Admission: RE | Admit: 2022-03-02 | Discharge: 2022-03-02 | Disposition: A | Discharge status: Home / Self Care | Payer: 59 | Source: Ambulatory Visit | Attending: Oncology | Admitting: Oncology

## 2022-03-02 DIAGNOSIS — C7951 Secondary malignant neoplasm of bone: Secondary | ICD-10-CM | POA: Insufficient documentation

## 2022-03-02 DIAGNOSIS — C642 Malignant neoplasm of left kidney, except renal pelvis: Secondary | ICD-10-CM | POA: Insufficient documentation

## 2022-03-02 DIAGNOSIS — C649 Malignant neoplasm of unspecified kidney, except renal pelvis: Secondary | ICD-10-CM | POA: Insufficient documentation

## 2022-03-02 DIAGNOSIS — Z905 Acquired absence of kidney: Secondary | ICD-10-CM | POA: Insufficient documentation

## 2022-03-02 DIAGNOSIS — Z923 Personal history of irradiation: Secondary | ICD-10-CM | POA: Insufficient documentation

## 2022-03-02 LAB — CBC WITH DIFFERENTIAL (CANCER CENTER ONLY)
Abs Immature Granulocytes: 0.25 10*3/uL — ABNORMAL HIGH (ref 0.00–0.07)
Basophils Absolute: 0.1 10*3/uL (ref 0.0–0.1)
Basophils Relative: 1 %
Eosinophils Absolute: 0 10*3/uL (ref 0.0–0.5)
Eosinophils Relative: 0 %
HCT: 45 % (ref 39.0–52.0)
Hemoglobin: 15 g/dL (ref 13.0–17.0)
Immature Granulocytes: 3 %
Lymphocytes Relative: 4 %
Lymphs Abs: 0.4 10*3/uL — ABNORMAL LOW (ref 0.7–4.0)
MCH: 29.6 pg (ref 26.0–34.0)
MCHC: 33.3 g/dL (ref 30.0–36.0)
MCV: 88.8 fL (ref 80.0–100.0)
Monocytes Absolute: 0.2 10*3/uL (ref 0.1–1.0)
Monocytes Relative: 2 %
Neutro Abs: 8.4 10*3/uL — ABNORMAL HIGH (ref 1.7–7.7)
Neutrophils Relative %: 90 %
Platelet Count: 271 10*3/uL (ref 150–400)
RBC: 5.07 MIL/uL (ref 4.22–5.81)
RDW: 16.1 % — ABNORMAL HIGH (ref 11.5–15.5)
WBC Count: 9.2 10*3/uL (ref 4.0–10.5)
nRBC: 0 % (ref 0.0–0.2)

## 2022-03-02 LAB — CMP (CANCER CENTER ONLY)
ALT: 26 U/L (ref 0–44)
AST: 17 U/L (ref 15–41)
Albumin: 4 g/dL (ref 3.5–5.0)
Alkaline Phosphatase: 63 U/L (ref 38–126)
Anion gap: 8 (ref 5–15)
BUN: 32 mg/dL — ABNORMAL HIGH (ref 6–20)
CO2: 26 mmol/L (ref 22–32)
Calcium: 9.8 mg/dL (ref 8.9–10.3)
Chloride: 102 mmol/L (ref 98–111)
Creatinine: 1 mg/dL (ref 0.61–1.24)
GFR, Estimated: >60 mL/min (ref >60)
Glucose, Bld: 133 mg/dL — ABNORMAL HIGH (ref 70–99)
Potassium: 4.7 mmol/L (ref 3.5–5.1)
Sodium: 136 mmol/L (ref 135–145)
Total Bilirubin: 0.4 mg/dL (ref 0.3–1.2)
Total Protein: 6.8 g/dL (ref 6.5–8.1)

## 2022-03-02 LAB — TSH: TSH: 0.354 u[IU]/mL (ref 0.350–4.500)

## 2022-03-02 MED ORDER — IOHEXOL 9 MG/ML PO SOLN
500.0000 mL | ORAL | Status: AC
  Administered 2022-03-02 (×2): 500 mL via ORAL

## 2022-03-02 MED ORDER — SODIUM CHLORIDE (PF) 0.9 % IJ SOLN
INTRAMUSCULAR | Status: AC
  Filled 2022-03-02: qty 50

## 2022-03-02 MED ORDER — IOHEXOL 9 MG/ML PO SOLN
ORAL | Status: AC
  Filled 2022-03-02: qty 1000

## 2022-03-02 MED ORDER — IOHEXOL 300 MG/ML  SOLN
100.0000 mL | Freq: Once | INTRAMUSCULAR | Status: AC | PRN
  Administered 2022-03-02: 100 mL via INTRAVENOUS

## 2022-03-04 LAB — T4: T4, Total: 7.6 ug/dL (ref 4.5–12.0)

## 2022-03-10 ENCOUNTER — Other Ambulatory Visit: Payer: Self-pay

## 2022-03-10 ENCOUNTER — Inpatient Hospital Stay (HOSPITAL_BASED_OUTPATIENT_CLINIC_OR_DEPARTMENT_OTHER): Payer: 59 | Admitting: Oncology

## 2022-03-10 VITALS — BP 117/85 | HR 76 | Temp 97.9°F | Resp 16 | Wt 193.8 lb

## 2022-03-10 DIAGNOSIS — C7951 Secondary malignant neoplasm of bone: Secondary | ICD-10-CM

## 2022-03-10 DIAGNOSIS — C649 Malignant neoplasm of unspecified kidney, except renal pelvis: Secondary | ICD-10-CM | POA: Diagnosis not present

## 2022-03-10 DIAGNOSIS — Z923 Personal history of irradiation: Secondary | ICD-10-CM | POA: Diagnosis not present

## 2022-03-10 DIAGNOSIS — C642 Malignant neoplasm of left kidney, except renal pelvis: Secondary | ICD-10-CM | POA: Diagnosis present

## 2022-03-10 DIAGNOSIS — Z905 Acquired absence of kidney: Secondary | ICD-10-CM | POA: Diagnosis not present

## 2022-03-10 NOTE — Progress Notes (Signed)
Hematology and Oncology Follow Up Visit  Gordon Mcclain [id number] 28-May-1975 46 y.o. 03/10/2022 11:57 AM [name], DOMatthews, Shirley, DO   Principle Diagnosis: 46 year old man with kidney cancer diagnosed in January 2023.  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   Prior Therapy:  He is status post left hip surgery completed in March 2023.  He underwent left hemiarthroplasty at that time.  Palliative radiation therapy to the left ribs with total of 30 Gray in 10 fractions completed in February 2023.  Ipilimumab 1 mg/kg and nivolumab 3 mg/kg started on May 18, 2021.  He completed 4 cycles of therapy on July 20, 2021.  Radiation therapy to the left hip for a total of 30 Gray in 10 fractions completed in May 2023.  He is status post robotic assisted left radical nephrectomy completed on Aug 02, 2021.  The final pathology showed predominantly extensive necrosis with 10% of the tumor cells viable indicating T1b.   Nivolumab 480 mg monthly maintenance started on Aug 24, 2021.  Last treatment was given in August 2023.  Therapy was discontinued due to presumed pneumonitis and organizing pneumonia.    Current therapy:   Active surveillance for his kidney cancer.  Prednisone taper currently on 30 mg daily under the care of pulmonary medicine for organizing pneumonia presumed pneumonitis.   Interim History: Gordon Mcclain returns today for repeat evaluation.  Since last visit, he reports feeling well without any respiratory complaints.  He is currently on prednisone taper with improvement in his symptoms.  He denies any shortness of breath or difficulty breathing.  He denies any excessive cough or wheezing.  He has resumed all work related duties.  He has gained weight related to prednisone however.   Medications: Reviewed without changes. Current Outpatient Medications  Medication Sig Dispense Refill   acetaminophen  (TYLENOL) 500 MG tablet Take 1,000 mg by mouth every 6 (six) hours as needed for moderate pain or headache.     albuterol (VENTOLIN HFA) 108 (90 Base) MCG/ACT inhaler Inhale 2 puffs into the lungs every 6 (six) hours as needed for wheezing or shortness of breath. 8 g 6   fluticasone (FLONASE) 50 MCG/ACT nasal spray Place 2 sprays into both nostrils daily. (Patient taking differently: Place 2 sprays into both nostrils daily as needed (dry cough).) 18.2 mL 2   predniSONE (DELTASONE) 10 MG tablet Take 1 tablet (10 mg total) by mouth daily with breakfast. 30 tablet 1   predniSONE (DELTASONE) 20 MG tablet Take 3 tablets (60 mg total) by mouth daily with breakfast for 14 days, THEN 2 tablets (40 mg total) daily with breakfast for 30 days, THEN 1.5 tablets (30 mg total) daily with breakfast for 30 days, THEN 1 tablet (20 mg total) daily with breakfast for 30 days, THEN 0.5 tablets (10 mg total) daily with breakfast. 135 tablet 0   prochlorperazine (COMPAZINE) 10 MG tablet Take 1 tablet (10 mg total) by mouth every 6 (six) hours as needed for nausea or vomiting. 30 tablet 0   sulfamethoxazole-trimethoprim (BACTRIM DS) 800-160 MG tablet Take 1 tablet by mouth 3 (three) times a week. 12 tablet 4   No current facility-administered medications for this visit.     Allergies:  Allergies  Allergen Reactions   Opdivo [Nivolumab] Other (See Comments)    Acute lower back pain - see progress note from 07/20/2021 and 09/29/21 Patient able to complete infusion with additional fluids run concurrently.  Physical Exam:     Blood pressure 117/85, pulse 76, temperature 97.9 F (36.6 C), temperature source Temporal, resp. rate 16, weight 193 lb 12.8 oz (87.9 kg), SpO2 99 %.     ECOG: 1   General appearance: Alert, awake without any distress. Head: Atraumatic without abnormalities Oropharynx: Without any thrush or ulcers. Eyes: No scleral icterus. Lymph nodes: No lymphadenopathy noted in the cervical,  supraclavicular, or axillary nodes Heart:regular rate and rhythm, without any murmurs or gallops.   Lung: Clear to auscultation without any rhonchi, wheezes or dullness to percussion. Abdomin: Soft, nontender without any shifting dullness or ascites. Musculoskeletal: No clubbing or cyanosis. Neurological: No motor or sensory deficits. Skin: No rashes or lesions.        Lab Results: Lab Results  Component Value Date   WBC 9.2 03/02/2022   HGB 15.0 03/02/2022   HCT 45.0 03/02/2022   MCV 88.8 03/02/2022   PLT 271 03/02/2022     Chemistry      Component Value Date/Time   NA 136 03/02/2022 1304   K 4.7 03/02/2022 1304   CL 102 03/02/2022 1304   CO2 26 03/02/2022 1304   BUN 32 (H) 03/02/2022 1304   CREATININE 1.00 03/02/2022 1304      Component Value Date/Time   CALCIUM 9.8 03/02/2022 1304   ALKPHOS 63 03/02/2022 1304   AST 17 03/02/2022 1304   ALT 26 03/02/2022 1304   BILITOT 0.4 03/02/2022 1304       Narrative & Impression  CLINICAL DATA:  Metastatic renal cell carcinoma * Tracking Code: BO *   EXAM: CT ABDOMEN AND PELVIS WITHOUT AND WITH CONTRAST   TECHNIQUE: Multidetector CT imaging of the abdomen and pelvis was performed following the standard protocol before and following the bolus administration of intravenous contrast.   RADIATION DOSE REDUCTION: This exam was performed according to the departmental dose-optimization program which includes automated exposure control, adjustment of the mA and/or kV according to patient size and/or use of iterative reconstruction technique.   CONTRAST:  164m OMNIPAQUE IOHEXOL 300 MG/ML  SOLN   COMPARISON:  10/25/2021   FINDINGS: Lower chest: Previously seen acute airspace disease within the lung bases is markedly improved, with residual fibrosis and architectural distortion, particularly in the left lung base although also seen lateral segment right middle lobe (series 3, image 13).   Hepatobiliary: No solid  liver abnormality is seen. Contracted gallbladder no gallstones, gallbladder wall thickening, or biliary dilatation.   Pancreas: Unremarkable. No pancreatic ductal dilatation or surrounding inflammatory changes.   Spleen: Normal in size without significant abnormality.   Adrenals/Urinary Tract: Adrenal glands are unremarkable. Status post left nephrectomy. No evidence of soft tissue or suspicious contrast enhancement in the nephrectomy bed. A previously seen small seroma in this vicinity is almost completely resolved (series 11, image 28). The right kidney is normal, without renal calculi, solid lesion, or hydronephrosis. Limited opacification of the urinary bladder on delayed phase imaging. Within this limitation, no evidence of upper urinary tract filling defect on delayed phase imaging. Bladder is unremarkable.   Stomach/Bowel: Stomach is within normal limits. Appendix appears normal. No evidence of bowel wall thickening, distention, or inflammatory changes.   Vascular/Lymphatic: Aortic atherosclerosis. No enlarged abdominal or pelvic lymph nodes.   Reproductive: No mass or other significant abnormality.   Other: No abdominal wall hernia or abnormality. No ascites.   Musculoskeletal: No acute osseous findings. Partially imaged lytic metastatic lesion of the left seventh rib (series 3, image 2). Status post  left hip total arthroplasty.   IMPRESSION: 1. Status post left nephrectomy. No evidence of soft tissue or suspicious contrast enhancement in the nephrectomy bed. Near complete resolution of a previously seen small seroma in this vicinity. 2. No evidence of lymphadenopathy or metastatic disease in the abdomen or pelvis. 3. Partially imaged lytic metastatic lesion of the left seventh rib, unchanged in appearance as imaged. No new osseous metastases in the abdomen or pelvis. 4. Previously seen acute airspace disease within the lung bases is markedly improved, with  residual fibrosis and architectural distortion, particularly in the left lung base although also seen lateral segment right middle lobe. Findings are consistent with resolving infection or inflammation, in the left lung base particularly possibly reflecting radiation fibrosis if there has been local radiation therapy to the overlying rib metastasis.       Impression and Plan:   46 year old with:  1.  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  He achieved complete response to immunotherapy.  He is currently on active surveillance after achieving complete response to immunotherapy, salvage radical nephrectomy and radiation therapy to his isolated metastatic rib lesions.  CT scan obtained on 03/02/2022 was personally reviewed and indicates a complete response to therapy.  Based on these findings, I have recommended continued active surveillance and only use salvage therapy if he has disease progression.  I would avoid immunotherapy at this time given his presumed pneumonitis and organizing pneumonia and ongoing prednisone taper.  Salvage therapy options utilizing Cabometyx or Inlyta could be utilized in the future.   2.  Organizing pneumonia and presumed pneumonitis: He is followed by pulmonary medicine with improvement in his symptoms.  He is on prednisone taper.       3.   Follow-up: In 3 months for a follow-up visit and repeat imaging studies.  I recommended follow-up every 3 months with scans at least for the next year.  The interval scanning can be changed if he continues to be disease-free.     30  minutes were dedicated to this encounter.  The time was spent on updating disease status, treatment choices, reviewing imaging studies and future plan of care discussion.  Zola Button, MD 12/15/202311:57 AM

## 2022-03-11 ENCOUNTER — Other Ambulatory Visit: Payer: Self-pay | Admitting: Pulmonary Disease

## 2022-03-11 DIAGNOSIS — J8489 Other specified interstitial pulmonary diseases: Secondary | ICD-10-CM

## 2022-03-11 LAB — ACID FAST CULTURE WITH REFLEXED SENSITIVITIES (MYCOBACTERIA)
Acid Fast Culture: NEGATIVE
Acid Fast Culture: NEGATIVE

## 2022-03-12 ENCOUNTER — Other Ambulatory Visit: Payer: Self-pay

## 2022-03-12 ENCOUNTER — Encounter: Payer: Self-pay | Admitting: Pulmonary Disease

## 2022-03-14 ENCOUNTER — Encounter: Payer: Self-pay | Admitting: Family Medicine

## 2022-03-14 DIAGNOSIS — E041 Nontoxic single thyroid nodule: Secondary | ICD-10-CM

## 2022-04-06 ENCOUNTER — Ambulatory Visit (INDEPENDENT_AMBULATORY_CARE_PROVIDER_SITE_OTHER): Payer: 59 | Admitting: Family Medicine

## 2022-04-06 ENCOUNTER — Encounter: Payer: Self-pay | Admitting: Family Medicine

## 2022-04-06 ENCOUNTER — Ambulatory Visit (INDEPENDENT_AMBULATORY_CARE_PROVIDER_SITE_OTHER): Payer: 59

## 2022-04-06 VITALS — BP 105/73 | HR 70 | Ht 66.0 in | Wt 201.1 lb

## 2022-04-06 DIAGNOSIS — M546 Pain in thoracic spine: Secondary | ICD-10-CM | POA: Diagnosis not present

## 2022-04-06 DIAGNOSIS — R252 Cramp and spasm: Secondary | ICD-10-CM | POA: Diagnosis not present

## 2022-04-06 DIAGNOSIS — D649 Anemia, unspecified: Secondary | ICD-10-CM | POA: Diagnosis not present

## 2022-04-06 DIAGNOSIS — C7951 Secondary malignant neoplasm of bone: Secondary | ICD-10-CM

## 2022-04-06 DIAGNOSIS — C649 Malignant neoplasm of unspecified kidney, except renal pelvis: Secondary | ICD-10-CM

## 2022-04-06 DIAGNOSIS — J84116 Cryptogenic organizing pneumonia: Secondary | ICD-10-CM | POA: Insufficient documentation

## 2022-04-06 NOTE — Assessment & Plan Note (Signed)
Managed by oncology at this time.  He will be switching oncologists in the next month.

## 2022-04-06 NOTE — Assessment & Plan Note (Signed)
History of rib lesion related to metastatic renal cell carcinoma.  He is having some increased thoracic spine.  X-rays of the thoracic spine ordered

## 2022-04-06 NOTE — Assessment & Plan Note (Signed)
Remains on steroid taper managed by pulmonology.

## 2022-04-06 NOTE — Progress Notes (Signed)
Gordon Mcclain - 47 y.o. male MRN [id number]  Date of birth: [birth year]  Subjective Chief Complaint  Patient presents with   Follow-up    HPI Gordon Mcclain is a 47 year old male here today for follow-up visit.  He has history of metastatic renal cell carcinoma, continues to see oncology regularly.  He had palliative radiation in February 2023 with ipilimumab and nivolumab.  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  He remains on prednisone taper under the guidance of pulmonary medicine.  Currently at 20 mg daily.  He has noted some cramping in his extremities since starting prednisone.  He has had some increased creased pain in his mid back with radiation into his left rib area.  He denies shortness of breath.  ROS:  A comprehensive ROS was completed and negative except as noted per HPI  Allergies  Allergen Reactions   Opdivo [Nivolumab] Other (See Comments)    Acute lower back pain - see progress note from 07/20/2021 and 09/29/21 Patient able to complete infusion with additional fluids run concurrently.     Past Medical History:  Diagnosis Date   Frequent headaches    occipital region mostly: takes ibuprofen 800 mg bid most days   Palpitations    Zio patch reassuring 04/2021   Renal cancer ([place]) 03/2021   Left->presented with bone met to L rib.   Renal mass 08/02/2021   Renal mass, left 04/20/2021   Tobacco dependence     Past Surgical History:  Procedure Laterality Date   BRONCHIAL BIOPSY  01/25/2022   Procedure: BRONCHIAL BIOPSIES;  Surgeon: Gordon Starr, MD;  Location: [facility] ENDOSCOPY;  Service: Pulmonary;;   BRONCHIAL WASHINGS  01/25/2022   Procedure: BRONCHIAL WASHINGS;  Surgeon: Gordon Starr, MD;  Location: [facility] ENDOSCOPY;  Service: Pulmonary;;   PARTIAL HIP ARTHROPLASTY Left    PILONIDAL CYST EXCISION  approx 2002   ROBOT ASSISTED LAPAROSCOPIC NEPHRECTOMY Left 08/02/2021    Procedure: XI ROBOTIC ASSISTED LAPAROSCOPIC RADICAL NEPHRECTOMY;  Surgeon: Gordon Mons, MD;  Location: [facility] ORS;  Service: Urology;  Laterality: Left;   VIDEO BRONCHOSCOPY N/A 01/25/2022   Procedure: VIDEO BRONCHOSCOPY WITH FLUORO;  Surgeon: Gordon Starr, MD;  Location: [facility] ENDOSCOPY;  Service: Pulmonary;  Laterality: N/A;   WISDOM TOOTH EXTRACTION     zio patch     04/2021 zio reassuring.    Social History   Socioeconomic History   Marital status: Married    Spouse name: Not on file   Number of children: Not on file   Years of education: Not on file   Highest education level: Not on file  Occupational History   Not on file  Tobacco Use   Smoking status: Former    Packs/day: 1.00    Years: 20.00    Total pack years: 20.00    Types: Cigarettes    Quit date: 04/27/2021    Years since quitting: 0.9    Passive exposure: Never   Smokeless tobacco: Never  Vaping Use   Vaping Use: Never used  Substance and Sexual Activity   Alcohol use: No   Drug use: No   Sexual activity: Yes    Partners: Female  Other Topics Concern   Not on file  Social History Narrative   Married, 1 daughter and one son.   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   Orig from [place], [place] area.   Education: HS.   Tob 20  pck-yr hx (current as of 03/2014).   Alc: none.  No hx of drug use.   Caffeine: 3-4 cups coffee/day, 1 cup tea per day.   No exercise.   Social Determinants of Health   Financial Resource Strain: Not on file  Food Insecurity: No Food Insecurity (12/27/2021)   Hunger Vital Sign    Worried About Running Out of Food in the Last Year: Never true    Ran Out of Food in the Last Year: Never true  Transportation Needs: No Transportation Needs (12/27/2021)   PRAPARE - Hydrologist (Medical): No    Lack of Transportation (Non-Medical): No  Physical Activity: Not on file  Stress: Not on file  Social Connections:  Not on file    Family History  Problem Relation Age of Onset   Arthritis Mother    Cancer Maternal Aunt    Heart attack Maternal Uncle    Heart attack Maternal Grandmother    Heart attack Maternal Grandfather    Heart attack Maternal Aunt     Health Maintenance  Topic Date Due   COLONOSCOPY (Pts 45-80yr Insurance coverage will need to be confirmed)  05/12/2022 (Originally 08/05/2019)   INFLUENZA VACCINE  06/25/2022 (Originally 10/25/2021)   Hepatitis C Screening  10/04/2022 (Originally 08/04/1992)   DTaP/Tdap/Td (2 - Td or Tdap) 06/20/2029   HIV Screening  Completed   HPV VACCINES  Aged Out   COVID-19 Vaccine  Discontinued     ----------------------------------------------------------------------------------------------------------------------------------------------------------------------------------------------------------------- Physical Exam BP 105/73 (BP Location: Left Arm, Patient Position: Sitting, Cuff Size: Large)   Pulse 70   Ht '5\' 6"'$  (1.676 m)   Wt 201 lb 1.9 oz (91.2 kg)   SpO2 98%   BMI 32.46 kg/m   Physical Exam Constitutional:      Appearance: Normal appearance.  HENT:     Head: Normocephalic and atraumatic.  Eyes:     General: No scleral icterus. Cardiovascular:     Rate and Rhythm: Normal rate and regular rhythm.  Pulmonary:     Effort: Pulmonary effort is normal.     Breath sounds: Normal breath sounds.  Neurological:     General: No focal deficit present.     Mental Status: He is alert.  Psychiatric:        Mood and Affect: Mood normal.        Behavior: Behavior normal.     ------------------------------------------------------------------------------------------------------------------------------------------------------------------------------------------------------------------- Assessment and Plan  Acute left-sided thoracic back pain History of rib lesion related to metastatic renal cell carcinoma.  He is having some increased thoracic  spine.  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  He will be switching oncologists in the next month.  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   No orders of the defined types were placed in this encounter.   No follow-ups on file.    This visit occurred during the SARS-CoV-2 public health emergency.  Safety protocols were in place, including screening questions prior to the visit, additional usage of staff PPE, and extensive cleaning of exam room while observing appropriate contact time as indicated for disinfecting solutions.

## 2022-04-07 ENCOUNTER — Encounter: Payer: Self-pay | Admitting: Oncology

## 2022-04-07 LAB — CBC WITH DIFFERENTIAL/PLATELET
Absolute Monocytes: 593 cells/uL (ref 200–950)
Basophils Absolute: 53 cells/uL (ref 0–200)
Basophils Relative: 0.7 %
Eosinophils Absolute: 0 cells/uL — ABNORMAL LOW (ref 15–500)
Eosinophils Relative: 0 %
HCT: 45.8 % (ref 38.5–50.0)
Hemoglobin: 15.5 g/dL (ref 13.2–17.1)
Lymphs Abs: 960 cells/uL (ref 850–3900)
MCH: 29.5 pg (ref 27.0–33.0)
MCHC: 33.8 g/dL (ref 32.0–36.0)
MCV: 87.2 fL (ref 80.0–100.0)
MPV: 10.4 fL (ref 7.5–12.5)
Monocytes Relative: 7.9 %
Neutro Abs: 5895 cells/uL (ref 1500–7800)
Neutrophils Relative %: 78.6 %
Platelets: 282 10*3/uL (ref 140–400)
RBC: 5.25 10*6/uL (ref 4.20–5.80)
RDW: 14.3 % (ref 11.0–15.0)
Total Lymphocyte: 12.8 %
WBC: 7.5 10*3/uL (ref 3.8–10.8)

## 2022-04-07 LAB — COMPLETE METABOLIC PANEL WITH GFR
AG Ratio: 1.8 (calc) (ref 1.0–2.5)
ALT: 67 U/L — ABNORMAL HIGH (ref 9–46)
AST: 29 U/L (ref 10–40)
Albumin: 4.4 g/dL (ref 3.6–5.1)
Alkaline phosphatase (APISO): 56 U/L (ref 36–130)
BUN: 20 mg/dL (ref 7–25)
CO2: 30 mmol/L (ref 20–32)
Calcium: 9.4 mg/dL (ref 8.6–10.3)
Chloride: 101 mmol/L (ref 98–110)
Creat: 1.19 mg/dL (ref 0.60–1.29)
Globulin: 2.5 g/dL (calc) (ref 1.9–3.7)
Glucose, Bld: 84 mg/dL (ref 65–99)
Potassium: 5 mmol/L (ref 3.5–5.3)
Sodium: 139 mmol/L (ref 135–146)
Total Bilirubin: 0.7 mg/dL (ref 0.2–1.2)
Total Protein: 6.9 g/dL (ref 6.1–8.1)
eGFR: 76 mL/min/{1.73_m2} (ref >=60)

## 2022-04-07 LAB — MAGNESIUM: Magnesium: 2.3 mg/dL (ref 1.5–2.5)

## 2022-04-11 ENCOUNTER — Other Ambulatory Visit: Payer: Self-pay | Admitting: Family Medicine

## 2022-04-11 DIAGNOSIS — E042 Nontoxic multinodular goiter: Secondary | ICD-10-CM | POA: Insufficient documentation

## 2022-04-11 DIAGNOSIS — R7989 Other specified abnormal findings of blood chemistry: Secondary | ICD-10-CM

## 2022-04-12 ENCOUNTER — Encounter: Payer: Self-pay | Admitting: Pulmonary Disease

## 2022-04-18 ENCOUNTER — Encounter: Payer: Self-pay | Admitting: Family Medicine

## 2022-04-18 ENCOUNTER — Encounter (INDEPENDENT_AMBULATORY_CARE_PROVIDER_SITE_OTHER): Payer: 59 | Admitting: Family Medicine

## 2022-04-18 ENCOUNTER — Other Ambulatory Visit: Payer: Self-pay | Admitting: Family Medicine

## 2022-04-18 DIAGNOSIS — U071 COVID-19: Secondary | ICD-10-CM

## 2022-04-18 MED ORDER — NIRMATRELVIR/RITONAVIR (PAXLOVID)TABLET: 3.0000 | ORAL_TABLET | Freq: Two times a day (BID) | ORAL | 0 refills | Status: AC

## 2022-04-18 NOTE — Telephone Encounter (Signed)
Please see the MyChart message reply(ies) for my assessment and plan.    This patient gave consent for this Medical Advice Message and is aware that it may result in a bill to their insurance company, as well as the possibility of receiving a bill for a co-payment or deductible. They are an established patient, but are not seeking medical advice exclusively about a problem treated during an in person or video visit in the last seven days. I did not recommend an in person or video visit within seven days of my reply.    I spent a total of 6 minutes cumulative time within 7 days through MyChart messaging.  Lavarius Doughten, DO   

## 2022-04-28 ENCOUNTER — Other Ambulatory Visit: Payer: Self-pay | Admitting: Pulmonary Disease

## 2022-04-28 DIAGNOSIS — J8489 Other specified interstitial pulmonary diseases: Secondary | ICD-10-CM

## 2022-05-02 ENCOUNTER — Ambulatory Visit: Payer: 59 | Admitting: Pulmonary Disease

## 2022-05-02 ENCOUNTER — Encounter: Payer: Self-pay | Admitting: Pulmonary Disease

## 2022-05-02 ENCOUNTER — Ambulatory Visit (INDEPENDENT_AMBULATORY_CARE_PROVIDER_SITE_OTHER): Payer: 59

## 2022-05-02 VITALS — BP 126/74 | HR 75 | Ht 65.0 in | Wt 209.4 lb

## 2022-05-02 DIAGNOSIS — J8489 Other specified interstitial pulmonary diseases: Secondary | ICD-10-CM

## 2022-05-02 MED ORDER — PREDNISONE 5 MG PO TABS: 5.0000 mg | ORAL_TABLET | Freq: Every day | ORAL | 0 refills | Status: DC

## 2022-05-02 NOTE — Progress Notes (Signed)
Synopsis: Referred in October 2023 for hospital follow up for pneumonia  Subjective:   PATIENT ID: Gordon Mcclain GENDER: male DOB: [birth year], MRN: [id number]  HPI  Chief Complaint  Patient presents with   Follow-up    Pt f/u for PNA, he tested positive covid approx 1/16. He is still taking the pred taper, but otherwise feels well.   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  Currently on prednisone '20mg'$  daily along with bactrim and continues to feel ok. He had covid a couple weeks ago and is feeling better from that.   OV 03/01/22 He was started on prednisone taper after last visit once biopsy results were confirmed with pathology indicating organizing pneumonia.   He was started on prednisone '60mg'$  daily for 1 month and has tapered down to '40mg'$  daily 11/21. He reports feeling much better. His cough has resolved and he is able to take deeper breaths.   Initial OV 01/30/22 He was admitted 10/3 to 10/4 for pneumonia with dense consolidations of the left lower lobe and small opacities in the right lower lobe periphery concerning for infectious pneumonia vs organizing pneumoina/pneumonitis in setting of nivolumab vs cancer progression. He was initially treated with antibiotics and felt improved. He followed up on 10/30 with Gordon Diesel, NP where he did not feel much different after completing the antibiotics with on going cough.  Repeat CT Chest scan 01/23/22 showed progressive multilobar consolidative and ground glass opacities.   He was taken for bronchoscopy 01/25/22 where LUL and LLL transbronchial biopsies were performed along with LUL BAL. Transbronchial biopsies are negative for malignancy and I am awaiting response from pathology whether inflammation was noted. Cultures  are negative to date. BAL cell count and differential showed lymphocyte predominance and 8% eosinophils.  Results were reviewed with patient with main concern for organizing pneumonia related to nivolumab.   Past Medical History:  Diagnosis Date   Frequent headaches    occipital region mostly: takes ibuprofen 800 mg bid most days   Palpitations    Zio patch reassuring 04/2021   Renal cancer ([place]) 03/2021   Left->presented with bone met to L rib.   Renal mass 08/02/2021   Renal mass, left 04/20/2021   Tobacco dependence      Family History  Problem Relation Age of Onset   Arthritis Mother    Cancer Maternal Aunt    Heart attack Maternal Uncle    Heart attack Maternal Grandmother    Heart attack Maternal Grandfather    Heart attack Maternal Aunt      Social History   Socioeconomic History   Marital status: Married    Spouse name: Not on file   Number of children: Not on file   Years of education: Not on file   Highest education level: Not on file  Occupational History   Not on file  Tobacco Use   Smoking status: Former    Packs/day: 1.00    Years: 20.00    Total pack years: 20.00    Types: Cigarettes    Quit date: 04/27/2021    Years since quitting: 1.0    Passive exposure: Never   Smokeless tobacco: Never  Vaping Use   Vaping Use: Never used  Substance and Sexual Activity   Alcohol use: No  Drug use: No   Sexual activity: Yes    Partners: Female  Other Topics Concern   Not on file  Social History Narrative   Married, 1 daughter and one son.   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   Orig from [place], [place] area.   Education: HS.   Tob 20  pck-yr hx (current as of 03/2014).   Alc: none.  No hx of drug use.   Caffeine: 3-4 cups coffee/day, 1 cup tea per day.   No exercise.   Social Determinants of Health   Financial Resource Strain: Not on file  Food Insecurity: No Food Insecurity (12/27/2021)   Hunger Vital Sign     Worried About Running Out of Food in the Last Year: Never true    Ran Out of Food in the Last Year: Never true  Transportation Needs: No Transportation Needs (12/27/2021)   PRAPARE - Hydrologist (Medical): No    Lack of Transportation (Non-Medical): No  Physical Activity: Not on file  Stress: Not on file  Social Connections: Not on file  Intimate Partner Violence: Not At Risk (12/27/2021)   Humiliation, Afraid, Rape, and Kick questionnaire    Fear of Current or Ex-Partner: No    Emotionally Abused: No    Physically Abused: No    Sexually Abused: No     Allergies  Allergen Reactions   Opdivo [Nivolumab] Other (See Comments)    Acute lower back pain - see progress note from 07/20/2021 and 09/29/21 Patient able to complete infusion with additional fluids run concurrently.      Outpatient Medications Prior to Visit  Medication Sig Dispense Refill   acetaminophen (TYLENOL) 500 MG tablet Take 1,000 mg by mouth every 6 (six) hours as needed for moderate pain or headache.     predniSONE (DELTASONE) 10 MG tablet TAKE 1 TABLET (10 MG TOTAL) BY MOUTH DAILY WITH BREAKFAST. 30 tablet 1   predniSONE (DELTASONE) 20 MG tablet Take 3 tablets (60 mg total) by mouth daily with breakfast for 14 days, THEN 2 tablets (40 mg total) daily with breakfast for 30 days, THEN 1.5 tablets (30 mg total) daily with breakfast for 30 days, THEN 1 tablet (20 mg total) daily with breakfast for 30 days, THEN 0.5 tablets (10 mg total) daily with breakfast. 135 tablet 0   prochlorperazine (COMPAZINE) 10 MG tablet Take 1 tablet (10 mg total) by mouth every 6 (six) hours as needed for nausea or vomiting. 30 tablet 0   sulfamethoxazole-trimethoprim (BACTRIM DS) 800-160 MG tablet Take 1 tablet by mouth 3 (three) times a week. 12 tablet 4   No facility-administered medications prior to visit.   Review of Systems  Constitutional:  Negative for chills, fever, malaise/fatigue and weight loss.        Weight gain  HENT:  Negative for congestion, sinus pain and sore throat.   Eyes: Negative.   Respiratory:  Negative for cough, hemoptysis, sputum production, shortness of breath and wheezing.   Cardiovascular:  Negative for chest pain, palpitations, orthopnea, claudication and leg swelling.  Gastrointestinal:  Negative for abdominal pain, heartburn, nausea and vomiting.  Genitourinary: Negative.   Musculoskeletal:  Negative for joint pain and myalgias.  Skin:  Negative for rash.  Neurological:  Negative for weakness.  Endo/Heme/Allergies: Negative.   Psychiatric/Behavioral: Negative.     Objective:   Vitals:   05/02/22 0858  BP: 126/74  Pulse: 75  SpO2: 100%  Weight: 209 lb 6.4 oz (95 kg)  Height: '5\' 5"'$  (1.651 m)    Physical Exam Constitutional:      General: He is not in acute distress. HENT:     Head: Normocephalic and atraumatic.  Eyes:     Conjunctiva/sclera: Conjunctivae normal.  Cardiovascular:     Rate and Rhythm: Normal rate and regular rhythm.     Pulses: Normal pulses.     Heart sounds: Normal heart sounds. No murmur heard. Pulmonary:     Breath sounds: No wheezing, rhonchi or rales.  Musculoskeletal:     Right lower leg: No edema.     Left lower leg: No edema.  Lymphadenopathy:     Cervical: No cervical adenopathy.  Skin:    General: Skin is warm and dry.  Neurological:     General: No focal deficit present.     Mental Status: He is alert.    CBC    Component Value Date/Time   WBC 7.5 04/06/2022 1033   RBC 5.25 04/06/2022 1033   HGB 15.5 04/06/2022 1033   HGB 15.0 03/02/2022 1304   HCT 45.8 04/06/2022 1033   PLT 282 04/06/2022 1033   PLT 271 03/02/2022 1304   MCV 87.2 04/06/2022 1033   MCH 29.5 04/06/2022 1033   MCHC 33.8 04/06/2022 1033   RDW 14.3 04/06/2022 1033   LYMPHSABS 960 04/06/2022 1033   MONOABS 0.2 03/02/2022 1304   EOSABS 0 (L) 04/06/2022 1033   BASOSABS 53 04/06/2022 1033      Latest Ref Rng & Units 04/06/2022   10:33 AM  03/02/2022    1:04 PM 01/26/2022    1:00 PM  BMP  Glucose 65 - 99 mg/dL 84  133  83   BUN 7 - 25 mg/dL 20  32  18   Creatinine 0.60 - 1.29 mg/dL 1.19  1.00  1.09   BUN/Creat Ratio 6 - 22 (calc) SEE NOTE:     Sodium 135 - 146 mmol/L 139  136  140   Potassium 3.5 - 5.3 mmol/L 5.0  4.7  3.7   Chloride 98 - 110 mmol/L 101  102  105   CO2 20 - 32 mmol/L '30  26  26   '$ Calcium 8.6 - 10.3 mg/dL 9.4  9.8  9.3     Chest imaging: CT Chest 01/23/22 Mediastinum/Nodes: Mildly hypoattenuating exophytic nodule off the thyroid isthmus measures 1.6 x 2.8 cm, previously evaluated by ultrasound 01/13/2022. No followup recommended. (Ref: J Am Coll Radiol. 2015 Feb;12(2): 143-50).Mediastinal lymph nodes are not enlarged by CT size criteria. Hilar regions are difficult to evaluate without IV contrast. No axillary adenopathy. Esophagus is grossly unremarkable.   Lungs/Pleura: Worsening multi lobar consolidation and ground-glass, left greater than right. Small left pleural effusion. Airway is unremarkable.  PFT:     No data to display          Labs:  Path:  Echo:  Heart Catheterization:  Assessment & Plan:   Organizing pneumonia [facility]) - Plan: DG Chest 2 View  Discussion: Gordon Mcclain is a 47 year old male, former smoker with renal cell cancer with metastasis to the bone status post left nephrectomy and total hip arthoplasty status post treatment with nivolumab who returns to clinic for follow up of organizing pneumonia.  He is to continue steroid taper as below.  60 mg daily 11/6 - 11/20  40 mg daily 11/21 - 12/19  30 mg daily 12/20 - 1/17  20 mg daily 1/18 - 2/15 '10mg'$  daily 2/16 - 3/15 '5mg'$  daily 3/17 -  3/24   Continue bactrim DS 1 tab 3 days per week until 2/15.  Check chest x-ray today.  Follow up in 2 months.  Freda Jackson, MD [facility] Office: [phone]    Current Outpatient Medications:    acetaminophen (TYLENOL) 500 MG tablet, Take  1,000 mg by mouth every 6 (six) hours as needed for moderate pain or headache., Disp: , Rfl:    predniSONE (DELTASONE) 10 MG tablet, TAKE 1 TABLET (10 MG TOTAL) BY MOUTH DAILY WITH BREAKFAST., Disp: 30 tablet, Rfl: 1   predniSONE (DELTASONE) 20 MG tablet, Take 3 tablets (60 mg total) by mouth daily with breakfast for 14 days, THEN 2 tablets (40 mg total) daily with breakfast for 30 days, THEN 1.5 tablets (30 mg total) daily with breakfast for 30 days, THEN 1 tablet (20 mg total) daily with breakfast for 30 days, THEN 0.5 tablets (10 mg total) daily with breakfast., Disp: 135 tablet, Rfl: 0   prochlorperazine (COMPAZINE) 10 MG tablet, Take 1 tablet (10 mg total) by mouth every 6 (six) hours as needed for nausea or vomiting., Disp: 30 tablet, Rfl: 0   sulfamethoxazole-trimethoprim (BACTRIM DS) 800-160 MG tablet, Take 1 tablet by mouth 3 (three) times a week., Disp: 12 tablet, Rfl: 4

## 2022-05-02 NOTE — Patient Instructions (Addendum)
Prednisone taper: 20 mg daily 1/18 - 2/15 '10mg'$  daily 2/16 - 3/15  '5mg'$  daily 3/16 - 3/23   Stop bactrim antibiotic on 2/16  We will check a chest x-ray today  Follow up in 2 months

## 2022-05-03 ENCOUNTER — Other Ambulatory Visit: Payer: Self-pay

## 2022-05-16 ENCOUNTER — Other Ambulatory Visit: Payer: Self-pay

## 2022-05-16 ENCOUNTER — Telehealth: Payer: Self-pay | Admitting: Medical Oncology

## 2022-05-16 DIAGNOSIS — C649 Malignant neoplasm of unspecified kidney, except renal pelvis: Secondary | ICD-10-CM

## 2022-05-16 NOTE — Telephone Encounter (Signed)
Wife notified that pt does not need to drink oral contrast and the decision is made by the radiologists.

## 2022-05-25 ENCOUNTER — Telehealth: Payer: Self-pay

## 2022-05-25 ENCOUNTER — Encounter: Payer: Self-pay | Admitting: Internal Medicine

## 2022-05-25 NOTE — Telephone Encounter (Signed)
This nurse attempted to reach this patient related to message of being sick to his stomach for the past two weeks and he is feeling dizzy and weak.Hulen Skains and spoke with his wife she states that she is not aware of him vomiting at all, she suggested to speak with patient directly.  Unable to reach patient.  Will send him a message on My chart.  No further concerns noted at this time.

## 2022-06-06 ENCOUNTER — Other Ambulatory Visit: Payer: Self-pay

## 2022-06-06 ENCOUNTER — Ambulatory Visit (HOSPITAL_COMMUNITY)
Admission: RE | Admit: 2022-06-06 | Discharge: 2022-06-06 | Disposition: A | Discharge status: Home / Self Care | Payer: 59 | Source: Ambulatory Visit | Attending: Oncology | Admitting: Oncology

## 2022-06-06 ENCOUNTER — Inpatient Hospital Stay: Payer: 59 | Attending: Physician Assistant

## 2022-06-06 DIAGNOSIS — C649 Malignant neoplasm of unspecified kidney, except renal pelvis: Secondary | ICD-10-CM | POA: Insufficient documentation

## 2022-06-06 DIAGNOSIS — Z79620 Long term (current) use of immunosuppressive biologic: Secondary | ICD-10-CM | POA: Insufficient documentation

## 2022-06-06 DIAGNOSIS — Z905 Acquired absence of kidney: Secondary | ICD-10-CM | POA: Insufficient documentation

## 2022-06-06 DIAGNOSIS — C642 Malignant neoplasm of left kidney, except renal pelvis: Secondary | ICD-10-CM | POA: Diagnosis present

## 2022-06-06 DIAGNOSIS — C7951 Secondary malignant neoplasm of bone: Secondary | ICD-10-CM | POA: Diagnosis present

## 2022-06-06 LAB — TSH: TSH: 0.434 u[IU]/mL (ref 0.350–4.500)

## 2022-06-06 LAB — CBC WITH DIFFERENTIAL/PLATELET
Abs Immature Granulocytes: 0.15 10*3/uL — ABNORMAL HIGH (ref 0.00–0.07)
Basophils Absolute: 0 10*3/uL (ref 0.0–0.1)
Basophils Relative: 0 %
Eosinophils Absolute: 0 10*3/uL (ref 0.0–0.5)
Eosinophils Relative: 0 %
HCT: 44 % (ref 39.0–52.0)
Hemoglobin: 14.8 g/dL (ref 13.0–17.0)
Immature Granulocytes: 2 %
Lymphocytes Relative: 12 %
Lymphs Abs: 0.9 10*3/uL (ref 0.7–4.0)
MCH: 31.5 pg (ref 26.0–34.0)
MCHC: 33.6 g/dL (ref 30.0–36.0)
MCV: 93.6 fL (ref 80.0–100.0)
Monocytes Absolute: 0.4 10*3/uL (ref 0.1–1.0)
Monocytes Relative: 5 %
Neutro Abs: 6.4 10*3/uL (ref 1.7–7.7)
Neutrophils Relative %: 81 %
Platelets: 277 10*3/uL (ref 150–400)
RBC: 4.7 MIL/uL (ref 4.22–5.81)
RDW: 12.5 % (ref 11.5–15.5)
WBC: 7.9 10*3/uL (ref 4.0–10.5)
nRBC: 0 % (ref 0.0–0.2)

## 2022-06-06 LAB — COMPREHENSIVE METABOLIC PANEL
ALT: 21 U/L (ref 0–44)
AST: 16 U/L (ref 15–41)
Albumin: 4.2 g/dL (ref 3.5–5.0)
Alkaline Phosphatase: 53 U/L (ref 38–126)
Anion gap: 6 (ref 5–15)
BUN: 18 mg/dL (ref 6–20)
CO2: 27 mmol/L (ref 22–32)
Calcium: 9.6 mg/dL (ref 8.9–10.3)
Chloride: 106 mmol/L (ref 98–111)
Creatinine, Ser: 1.11 mg/dL (ref 0.61–1.24)
GFR, Estimated: >60 mL/min (ref >60)
Glucose, Bld: 106 mg/dL — ABNORMAL HIGH (ref 70–99)
Potassium: 4.9 mmol/L (ref 3.5–5.1)
Sodium: 139 mmol/L (ref 135–145)
Total Bilirubin: 0.5 mg/dL (ref 0.3–1.2)
Total Protein: 6.9 g/dL (ref 6.5–8.1)

## 2022-06-06 MED ORDER — IOHEXOL 300 MG/ML  SOLN
100.0000 mL | Freq: Once | INTRAMUSCULAR | Status: AC | PRN
  Administered 2022-06-06: 100 mL via INTRAVENOUS

## 2022-06-13 ENCOUNTER — Ambulatory Visit (HOSPITAL_COMMUNITY)
Admission: RE | Admit: 2022-06-13 | Discharge: 2022-06-13 | Disposition: A | Discharge status: Home / Self Care | Payer: 59 | Source: Ambulatory Visit | Attending: Oncology | Admitting: Oncology

## 2022-06-13 ENCOUNTER — Other Ambulatory Visit: Payer: Self-pay

## 2022-06-13 ENCOUNTER — Encounter (HOSPITAL_COMMUNITY): Payer: Self-pay

## 2022-06-13 ENCOUNTER — Inpatient Hospital Stay: Payer: 59 | Admitting: Internal Medicine

## 2022-06-13 VITALS — BP 122/82 | HR 73 | Temp 97.2°F | Resp 18 | Wt 214.1 lb

## 2022-06-13 DIAGNOSIS — C7951 Secondary malignant neoplasm of bone: Secondary | ICD-10-CM | POA: Insufficient documentation

## 2022-06-13 DIAGNOSIS — C649 Malignant neoplasm of unspecified kidney, except renal pelvis: Secondary | ICD-10-CM

## 2022-06-13 MED ORDER — SODIUM CHLORIDE (PF) 0.9 % IJ SOLN
INTRAMUSCULAR | Status: AC
  Filled 2022-06-13: qty 50

## 2022-06-13 MED ORDER — IOHEXOL 300 MG/ML  SOLN
75.0000 mL | Freq: Once | INTRAMUSCULAR | Status: AC | PRN
  Administered 2022-06-13: 75 mL via INTRAVENOUS

## 2022-06-13 NOTE — Progress Notes (Signed)
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  PRIOR THERAPY: Status post left hip surgery completed in March 2023 with left hemiarthroplasty. Status post palliative radiotherapy to the left ribs completed in February 2023. Status post treatment with immunotherapy with ipilimumab 1 Mg/KG and nivolumab 3 mg/KG started May 18, 2021 for 4 cycles completed on July 20, 2021. Status post radiotherapy to the left hip completed in May 2023. Status post left radical nephrectomy on Aug 02, 2021 and the final pathology showed predominantly extensive necrosis with 10% of the tumor cells viable indicating T1b. Status post maintenance treatment with immunotherapy with nivolumab 480 Mg IV started Aug 24, 2021 and discontinued in August 2023 secondary to suspicious immunotherapy mediated pneumonitis.  Followed by prednisone taper for the treatment of the pneumonitis.  CURRENT THERAPY: Observation  INTERVAL HISTORY: Gordon Mcclain 47 y.o. male came to the clinic today to establish care with me after his primary oncologist Dr. Alen Blew left the practice.  The patient was accompanied by his wife Gordon Mcclain.  He has intermittent left-sided chest pain likely from the previously treated rib metastatic lesion but no shortness of breath, cough or hemoptysis.  He has no nausea, vomiting, diarrhea or constipation.  He has no headache or visual changes.  He denied having any fever or chills.  He has been on a tapered dose of prednisone for the last 6 months because of the immunotherapy-induced pneumonitis.  He gained over 40 pounds during this time.  The patient had repeat CT scan of the chest, abdomen pelvis performed recently and he is here for evaluation and discussion of  his discuss results and treatment options. Past medical history is unremarkable except for the kidney cancer. Family history significant for mother who is alive with heart disease.  Father has unknown medical history. The patient is married and has 2 children a son and daughter.  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  He has a history for smoking for around 25 years and quit in February 2023.  He has no history of alcohol or drug abuse.  MEDICAL HISTORY: Past Medical History:  Diagnosis Date   Frequent headaches    occipital region mostly: takes ibuprofen 800 mg bid most days   Palpitations    Zio patch reassuring 04/2021   Renal cancer ([place]) 03/2021   Left->presented with bone met to L rib.   Renal mass 08/02/2021   Renal mass, left 04/20/2021   Tobacco dependence     ALLERGIES:  is allergic to opdivo [nivolumab].  MEDICATIONS:  Current Outpatient Medications  Medication Sig Dispense Refill   acetaminophen (TYLENOL) 500 MG tablet Take 1,000 mg by mouth every 6 (six) hours as needed for moderate pain or headache.     predniSONE (DELTASONE) 10 MG tablet TAKE 1 TABLET (10 MG TOTAL) BY MOUTH DAILY WITH BREAKFAST. 30 tablet 1   predniSONE (DELTASONE) 5 MG tablet Take 1 tablet (5 mg total) by mouth daily with breakfast. 15 tablet 0   prochlorperazine (COMPAZINE) 10 MG tablet Take 1 tablet (10 mg total) by mouth every 6 (six) hours as needed for nausea or vomiting. 30 tablet 0   sulfamethoxazole-trimethoprim (BACTRIM DS) 800-160 MG tablet Take 1 tablet by mouth 3 (three) times a  week. 12 tablet 4   No current facility-administered medications for this visit.   Facility-Administered Medications Ordered in Other Visits  Medication Dose Route Frequency Provider Last Rate Last Admin   sodium chloride (PF) 0.9 % injection             SURGICAL HISTORY:  Past Surgical History:  Procedure Laterality Date   BRONCHIAL BIOPSY  01/25/2022   Procedure: BRONCHIAL BIOPSIES;   Surgeon: [name], MD;  Location: [facility] ENDOSCOPY;  Service: Pulmonary;;   BRONCHIAL WASHINGS  01/25/2022   Procedure: BRONCHIAL WASHINGS;  Surgeon: [name], MD;  Location: [facility] ENDOSCOPY;  Service: Pulmonary;;   PARTIAL HIP ARTHROPLASTY Left    PILONIDAL CYST EXCISION  approx 2002   ROBOT ASSISTED LAPAROSCOPIC NEPHRECTOMY Left 08/02/2021   Procedure: XI ROBOTIC ASSISTED LAPAROSCOPIC RADICAL NEPHRECTOMY;  Surgeon: Ceasar Mons, MD;  Location: [facility] ORS;  Service: Urology;  Laterality: Left;   VIDEO BRONCHOSCOPY N/A 01/25/2022   Procedure: VIDEO BRONCHOSCOPY WITH FLUORO;  Surgeon: [name], MD;  Location: [facility] ENDOSCOPY;  Service: Pulmonary;  Laterality: N/A;   WISDOM TOOTH EXTRACTION     zio patch     04/2021 zio reassuring.    REVIEW OF SYSTEMS:  Constitutional: positive for fatigue Eyes: negative Ears, nose, mouth, throat, and face: negative Respiratory: positive for dyspnea on exertion Cardiovascular: negative Gastrointestinal: negative Genitourinary:negative Integument/breast: negative Hematologic/lymphatic: negative Musculoskeletal:negative Neurological: negative Behavioral/Psych: negative Endocrine: negative Allergic/Immunologic: negative   PHYSICAL EXAMINATION: General appearance: alert, cooperative, fatigued, and no distress Head: Normocephalic, without obvious abnormality, atraumatic Neck: no adenopathy, no JVD, supple, symmetrical, trachea midline, and thyroid not enlarged, symmetric, no tenderness/mass/nodules Lymph nodes: Cervical, supraclavicular, and axillary nodes normal. Resp: clear to auscultation bilaterally Back: symmetric, no curvature. ROM normal. No CVA tenderness. Cardio: regular rate and rhythm, S1, S2 normal, no murmur, click, rub or gallop GI: soft, non-tender; bowel sounds normal; no masses,  no organomegaly Extremities: extremities normal, atraumatic, no cyanosis or edema Neurologic: Alert and oriented X 3, normal strength  and tone. Normal symmetric reflexes. Normal coordination and gait  ECOG PERFORMANCE STATUS: 1 - Symptomatic but completely ambulatory  Blood pressure 122/82, pulse 73, temperature (!) 97.2 F (36.2 C), temperature source Temporal, resp. rate 18, weight 214 lb 1 oz (97.1 kg), SpO2 100 %.  LABORATORY DATA: Lab Results  Component Value Date   WBC 7.9 06/06/2022   HGB 14.8 06/06/2022   HCT 44.0 06/06/2022   MCV 93.6 06/06/2022   PLT 277 06/06/2022      Chemistry      Component Value Date/Time   NA 139 06/06/2022 1430   K 4.9 06/06/2022 1430   CL 106 06/06/2022 1430   CO2 27 06/06/2022 1430   BUN 18 06/06/2022 1430   CREATININE 1.11 06/06/2022 1430   CREATININE 1.19 04/06/2022 1033      Component Value Date/Time   CALCIUM 9.6 06/06/2022 1430   ALKPHOS 53 06/06/2022 1430   AST 16 06/06/2022 1430   AST 17 03/02/2022 1304   ALT 21 06/06/2022 1430   ALT 26 03/02/2022 1304   BILITOT 0.5 06/06/2022 1430   BILITOT 0.4 03/02/2022 1304       RADIOGRAPHIC STUDIES: CT Abdomen Pelvis W Wo Contrast  Result Date: 06/08/2022 CLINICAL DATA:  Metastatic renal cell carcinoma. Restaging. * Tracking Code: BO * EXAM: CT ABDOMEN AND PELVIS WITHOUT AND WITH CONTRAST TECHNIQUE: Multidetector CT imaging of the abdomen and pelvis was performed following the standard protocol before and following the bolus  administration of intravenous contrast. RADIATION DOSE REDUCTION: This exam was performed according to the departmental dose-optimization program which includes automated exposure control, adjustment of the mA and/or kV according to patient size and/or use of iterative reconstruction technique. CONTRAST:  176mL OMNIPAQUE IOHEXOL 300 MG/ML  SOLN COMPARISON:  03/02/2022 FINDINGS: Lower chest: 9 mm nodular opacity in the right lower lobe on image 1/series 3 has been incompletely visualized. 13 mm nodule right lower lobe on image 11/3 is new in the interval. Similar architectural distortion/scarring noted  left lower lobe. Hepatobiliary: No suspicious focal abnormality within the liver parenchyma. There is no evidence for gallstones, gallbladder wall thickening, or pericholecystic fluid. No intrahepatic or extrahepatic biliary dilation. Pancreas: No focal mass lesion. No dilatation of the main duct. No intraparenchymal cyst. No peripancreatic edema. Spleen: No splenomegaly. No focal mass lesion. Adrenals/Urinary Tract: No adrenal nodule or mass. Status post left nephrectomy. Bandlike soft tissue along the posterior left hemidiaphragm on image 34/6 is stable. Otherwise no suspicious soft tissue in the left nephrectomy bed. Right kidney and ureter unremarkable. The urinary bladder appears normal for the degree of distention. Stomach/Bowel: Stomach is unremarkable. No gastric wall thickening. No evidence of outlet obstruction. Duodenum is normally positioned as is the ligament of Treitz. No small bowel wall thickening. No small bowel dilatation. The terminal ileum is normal. The appendix is normal. No gross colonic mass. No colonic wall thickening. Vascular/Lymphatic: There is mild atherosclerotic calcification of the abdominal aorta without aneurysm. There is no gastrohepatic or hepatoduodenal ligament lymphadenopathy. No retroperitoneal or mesenteric lymphadenopathy. No pelvic sidewall lymphadenopathy. Reproductive: The prostate gland and seminal vesicles are unremarkable. Other: No intraperitoneal free fluid. Musculoskeletal: Status post left hip replacement. No worrisome lytic or sclerotic osseous abnormality. IMPRESSION: 1. Status post left nephrectomy. No evidence for local recurrence. 2. No evidence for metastatic disease in the abdomen or pelvis. 3. New 13 mm right lower lobe pulmonary nodule with 9 mm nodular opacity in the right lower lobe, incompletely visualized on today's exam. Metastatic disease a concern. Patient is scheduled for chest CT which will be reported separately. 4.  Aortic Atherosclerosis  (ICD10-I70.0). Electronically Signed   By: Misty Stanley M.D.   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 He is status post left hip surgery completed in March 2023 with left hemiarthroplasty followed by palliative radiotherapy to the left ribs completed in February 2023. The patient is also status post treatment with immunotherapy with ipilimumab 1 Mg/KG and nivolumab 3 mg/KG started May 18, 2021 for 4 cycles completed on July 20, 2021. He received radiotherapy to the left hip completed in May 2023. He is also status post left radical nephrectomy on Aug 02, 2021 and the final pathology showed predominantly extensive necrosis with 10% of the tumor cells viable indicating T1b. Status post maintenance treatment with immunotherapy with nivolumab 480 Mg IV started Aug 24, 2021 and discontinued in August 2023 secondary to suspicious immunotherapy mediated pneumonitis.  Followed by prednisone taper for the treatment of the pneumonitis. He is currently on observation and feeling fine except for the weight gain from the steroid injection. He had repeat CT scan of the chest, abdomen and pelvis performed recently.  I personally and independently reviewed the scan images and discussed results with the patient and his wife. His scan showed no concerning findings for disease recurrence in  the abdomen or pelvis but there was new 1.3 cm right lower lobe pulmonary nodule as well as 0.9 cm nodular opacity in the right lower lobe that were seen on the CT scan of the abdomen and pelvis.  He had CT scan of the chest but the results are still pending. I recommended for the patient to continue on observation for now unless the CT scan of the chest showed evidence for disease progression in the lung, I will consider him for a PET scan and follow-up visit in few weeks  for more detailed discussion of his treatment options. I have the CT scan of the chest showed no concerning finding for progression, I will see him back for follow-up visit in 2 months for evaluation and repeat CT scan of the chest, abdomen and pelvis for restaging of his disease. The patient was advised to call immediately if he has any concerning symptoms in the interval. The patient voices understanding of current disease status and treatment options and is in agreement with the current care plan.  All questions were answered. The patient knows to call the clinic with any problems, questions or concerns. We can certainly see the patient much sooner if necessary.  The total time spent in the appointment was 60 minutes.  Disclaimer: This note was dictated with voice recognition software. Similar sounding words can inadvertently be transcribed and may not be corrected upon review.

## 2022-06-27 ENCOUNTER — Other Ambulatory Visit: Payer: Self-pay

## 2022-07-06 ENCOUNTER — Other Ambulatory Visit: Payer: 59

## 2022-07-06 ENCOUNTER — Telehealth: Payer: Self-pay | Admitting: Emergency Medicine

## 2022-07-06 ENCOUNTER — Other Ambulatory Visit: Payer: Self-pay

## 2022-07-06 ENCOUNTER — Encounter: Payer: Self-pay | Admitting: Pulmonary Disease

## 2022-07-06 ENCOUNTER — Other Ambulatory Visit: Payer: Self-pay | Admitting: Emergency Medicine

## 2022-07-06 ENCOUNTER — Ambulatory Visit: Payer: 59 | Admitting: Pulmonary Disease

## 2022-07-06 ENCOUNTER — Encounter: Payer: Self-pay | Admitting: Emergency Medicine

## 2022-07-06 VITALS — BP 124/72 | HR 68 | Ht 65.0 in | Wt 214.0 lb

## 2022-07-06 DIAGNOSIS — R918 Other nonspecific abnormal finding of lung field: Secondary | ICD-10-CM

## 2022-07-06 DIAGNOSIS — C7951 Secondary malignant neoplasm of bone: Secondary | ICD-10-CM

## 2022-07-06 DIAGNOSIS — J8489 Other specified interstitial pulmonary diseases: Secondary | ICD-10-CM

## 2022-07-06 DIAGNOSIS — C649 Malignant neoplasm of unspecified kidney, except renal pelvis: Secondary | ICD-10-CM

## 2022-07-06 NOTE — H&P (View-Only) (Signed)
Synopsis: Referred in October 2023 for hospital follow up for pneumonia  Subjective:   PATIENT ID: Gordon Mcclain GENDER: male DOB: 12/01/1975, MRN: [id number]  HPI  Chief Complaint  Patient presents with   Follow-up    F/U after PNA. States he has been doing well since last visit. Still has some SOB with activity.    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  He was seen by Dr. Shirline Frees of oncology 3/19. CT Chest 06/13/22 shows new nodular densities in the right lung, 2.8x1.9cm in RUL, 1cm RLL and 1.3cm RLL. The multifocal groungglass opacities have resolved from prior CT in October. Area of arcitectural distortion with volume loss and traction bronchiectasis in LLL is unchanged. No enlarged mediastinal or hilar lymph nodes.   He feels sick to his stomach intermittently where he feels weak and yucky. He complains of chest pains and muscle aches. Last night he felt like he had chills. No sweats.    OV 05/02/22 Currently on prednisone 20mg  daily along with bactrim and continues to feel ok. He had covid a couple weeks ago and is feeling better from that.   OV 03/01/22 He was started on prednisone taper after last visit once biopsy results were confirmed with pathology indicating organizing pneumonia.   He was started on prednisone 60mg  daily for 1 month and has tapered down to 40mg  daily 11/21. He reports feeling much better. His cough has resolved and he is able to take deeper breaths.   Initial OV 01/30/22 He was admitted 10/3 to 10/4 for pneumonia with dense consolidations of the left lower lobe and small opacities in the right lower lobe periphery concerning for infectious pneumonia vs organizing pneumoina/pneumonitis in setting of nivolumab vs cancer progression. He was  initially treated with antibiotics and felt improved. He followed up on 10/30 with Rhunette Croft, NP where he did not feel much different after completing the antibiotics with on going cough.  Repeat CT Chest scan 01/23/22 showed progressive multilobar consolidative and ground glass opacities.   He was taken for bronchoscopy 01/25/22 where LUL and LLL transbronchial biopsies were performed along with LUL BAL. Transbronchial biopsies are negative for malignancy and I am awaiting response from pathology whether inflammation was noted. Cultures are negative to date. BAL cell count and differential showed lymphocyte predominance and 8% eosinophils.  Results were reviewed with patient with main concern for organizing pneumonia related to nivolumab.   Past Medical History:  Diagnosis Date   Frequent headaches    occipital region mostly: takes ibuprofen 800 mg bid most days   Palpitations    Zio patch reassuring 04/2021   Renal cancer 03/2021   Left->presented with bone met to L rib.   Renal mass 08/02/2021   Renal mass, left 04/20/2021   Tobacco dependence      Family History  Problem Relation Age of Onset   Arthritis Mother    Cancer Maternal Aunt    Heart attack Maternal Uncle    Heart attack Maternal Grandmother    Heart attack Maternal Grandfather    Heart attack Maternal Aunt      Social History   Socioeconomic History   Marital status: Married    Spouse name: Not on file   Number of children: Not on file   Years  of education: Not on file   Highest education level: Not on file  Occupational History   Not on file  Tobacco Use   Smoking status: Former    Packs/day: 1.00    Years: 20.00    Additional pack years: 0.00    Total pack years: 20.00    Types: Cigarettes    Quit date: 04/27/2021    Years since quitting: 1.1    Passive exposure: Never   Smokeless tobacco: Never  Vaping Use   Vaping Use: Never used  Substance and Sexual Activity   Alcohol use: No   Drug use: No    Sexual activity: Yes    Partners: Female  Other Topics Concern   Not on file  Social History Narrative   Married, 1 daughter and one son.   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   Orig from [place].   Education: HS.   Tob 20  pck-yr hx (current as of 03/2014).   Alc: none.  No hx of drug use.   Caffeine: 3-4 cups coffee/day, 1 cup tea per day.   No exercise.   Social Determinants of Health   Financial Resource Strain: Not on file  Food Insecurity: No Food Insecurity (12/27/2021)   Hunger Vital Sign    Worried About Running Out of Food in the Last Year: Never true    Ran Out of Food in the Last Year: Never true  Transportation Needs: No Transportation Needs (12/27/2021)   PRAPARE - Administrator, Civil Service (Medical): No    Lack of Transportation (Non-Medical): No  Physical Activity: Not on file  Stress: Not on file  Social Connections: Not on file  Intimate Partner Violence: Not At Risk (12/27/2021)   Humiliation, Afraid, Rape, and Kick questionnaire    Fear of Current or Ex-Partner: No    Emotionally Abused: No    Physically Abused: No    Sexually Abused: No     Allergies  Allergen Reactions   Opdivo [Nivolumab] Other (See Comments)    Acute lower back pain - see progress note from 07/20/2021 and 09/29/21 Patient able to complete infusion with additional fluids run concurrently.      Outpatient Medications Prior to Visit  Medication Sig Dispense Refill   acetaminophen (TYLENOL) 500 MG tablet Take 1,000 mg by mouth every 6 (six) hours as needed for moderate pain or headache.     predniSONE (DELTASONE) 10 MG tablet TAKE 1 TABLET (10 MG TOTAL) BY MOUTH DAILY WITH BREAKFAST. 30 tablet 1   predniSONE (DELTASONE) 5 MG tablet Take 1 tablet (5 mg total) by mouth daily with breakfast. 15 tablet 0   prochlorperazine (COMPAZINE) 10 MG tablet Take 1 tablet (10 mg total) by mouth every 6 (six) hours as needed for nausea or  vomiting. 30 tablet 0   sulfamethoxazole-trimethoprim (BACTRIM DS) 800-160 MG tablet Take 1 tablet by mouth 3 (three) times a week. 12 tablet 4   No facility-administered medications prior to visit.   Review of Systems  Constitutional:  Positive for malaise/fatigue. Negative for chills, fever and weight loss.       Weight gain  HENT:  Negative for congestion, sinus pain and sore throat.   Eyes: Negative.   Respiratory:  Negative for cough, hemoptysis, sputum production, shortness of breath and wheezing.   Cardiovascular:  Negative for chest pain, palpitations, orthopnea, claudication and leg swelling.  Gastrointestinal:  Positive for nausea. Negative for abdominal pain, heartburn and vomiting.  Genitourinary:  Negative.   Musculoskeletal:  Negative for joint pain and myalgias.  Skin:  Negative for rash.  Neurological:  Negative for weakness.  Endo/Heme/Allergies: Negative.   Psychiatric/Behavioral: Negative.     Objective:   Vitals:   07/06/22 0907  BP: 124/72  Pulse: 68  SpO2: 98%  Weight: 214 lb (97.1 kg)  Height:  (1.651 m)    Physical Exam Constitutional:      General: He is not in acute distress. HENT:     Head: Normocephalic and atraumatic.  Eyes:     Conjunctiva/sclera: Conjunctivae normal.  Cardiovascular:     Rate and Rhythm: Normal rate and regular rhythm.     Pulses: Normal pulses.     Heart sounds: Normal heart sounds. No murmur heard. Pulmonary:     Breath sounds: No wheezing, rhonchi or rales.  Musculoskeletal:     Right lower leg: No edema.     Left lower leg: No edema.  Lymphadenopathy:     Cervical: No cervical adenopathy.  Skin:    General: Skin is warm and dry.  Neurological:     General: No focal deficit present.     Mental Status: He is alert.    CBC    Component Value Date/Time   WBC 7.9 06/06/2022 1430   RBC 4.70 06/06/2022 1430   HGB 14.8 06/06/2022 1430   HGB 15.0 03/02/2022 1304   HCT 44.0 06/06/2022 1430   PLT 277  06/06/2022 1430   PLT 271 03/02/2022 1304   MCV 93.6 06/06/2022 1430   MCH 31.5 06/06/2022 1430   MCHC 33.6 06/06/2022 1430   RDW 12.5 06/06/2022 1430   LYMPHSABS 0.9 06/06/2022 1430   MONOABS 0.4 06/06/2022 1430   EOSABS 0.0 06/06/2022 1430   BASOSABS 0.0 06/06/2022 1430      Latest Ref Rng & Units 06/06/2022    2:30 PM 04/06/2022   10:33 AM 03/02/2022    1:04 PM  BMP  Glucose 70 - 99 mg/dL 161  84  096   BUN 6 - 20 mg/dL 18  20  32   Creatinine 0.61 - 1.24 mg/dL 0.45  4.09  8.11   BUN/Creat Ratio 6 - 22 (calc)  SEE NOTE:    Sodium 135 - 145 mmol/L 139  139  136   Potassium 3.5 - 5.1 mmol/L 4.9  5.0  4.7   Chloride 98 - 111 mmol/L 106  101  102   CO2 22 - 32 mmol/L Calcium 8.9 - 10.3 mg/dL 9.6  9.4  9.8     Chest imaging: CT Chest 06/13/22 1. Interval resolution of multifocal airspace and ground-glass opacities seen on previous chest CT with mild residual postinflammatory scarring. 2. Multiple indeterminate ill-defined nodular densities in the right lung are new from previous chest CT of 5 months ago and are suspicious for inflammatory nodules or atypical infection. Metastatic disease is not completely excluded but considered less likely given the unilateral nature of the nodules and rapid development from previous CT. Recommend chest CT follow-up in 3 months. 3. Stable appearance of treated left rib metastasis. No new osseous metastases or acute osseous findings. 4. Stable appearance of the nodule involving thyroid isthmus, previously evaluated by thyroid ultrasound and biopsy. Stable well-circumscribed left paratracheal nodule, potentially another ectopic thyroid nodule. 5.  Aortic Atherosclerosis  CT Chest 01/23/22 Mediastinum/Nodes: Mildly hypoattenuating exophytic nodule off the thyroid isthmus measures 1.6 x 2.8 cm, previously evaluated by ultrasound 01/13/2022. No followup  recommended. (Ref: J Am Coll Radiol. 2015 Feb;12(2): 143-50).Mediastinal  lymph nodes are not enlarged by CT size criteria. Hilar regions are difficult to evaluate without IV contrast. No axillary adenopathy. Esophagus is grossly unremarkable.   Lungs/Pleura: Worsening multi lobar consolidation and ground-glass, left greater than right. Small left pleural effusion. Airway is unremarkable.  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  He has completed steroid taper for organizing pneumonia with resolution of the multifocal airspace opacities and residual post inflammatory scarring of RLL.   He has new nodular densities in the right lung that appear more inflammatory or infectious but given his history of renal cell carcinoma malignancy is possible as well.  We discussed monitoring vs nav bronch for biopsies and BAL cultures. He would like to proceed with bronchoscopy. I have reached out to Dr. Delton Coombes and Dr. Tonia Brooms for scheduling of his procedure.   His symptoms of generally not feeling well with stomach upset are less likely due to renal insufficiency after steroid taper as he was experiencing these symptoms while on the taper. We will continue to monitor.  Follow up in 3 months.  Melody Comas, MD [facility] Office: [phone]    Current Outpatient Medications:    acetaminophen (TYLENOL) 500 MG tablet, Take 1,000 mg by mouth every 6 (six) hours as needed for moderate pain or headache., Disp: , Rfl:

## 2022-07-06 NOTE — Patient Instructions (Addendum)
I will reach out to Dr. Tonia Brooms, Dr. Delton Coombes and Dr. Thora Lance about scheduling you for navigational bronchoscopy to biopsy the new lung nodules.   We will let you know if we need an updated CT Chest prior to the procedure.  Follow up in 3 months

## 2022-07-06 NOTE — Progress Notes (Signed)
Orders placed to arrange navigational bronchoscopy.  Goal 07/10/22

## 2022-07-06 NOTE — Telephone Encounter (Signed)
Sent secure chat to Dr. Delton Coombes.

## 2022-07-06 NOTE — Telephone Encounter (Signed)
Per Dr. Delton Coombes, he is placing orders needed for Monday's procedure.

## 2022-07-06 NOTE — Progress Notes (Signed)
Synopsis: Referred in October 2023 for hospital follow up for pneumonia  Subjective:   PATIENT ID: Gordon Mcclain GENDER: male DOB: 12/01/1975, MRN: [id number]  HPI  Chief Complaint  Patient presents with   Follow-up    F/U after PNA. States he has been doing well since last visit. Still has some SOB with activity.    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  He was seen by Dr. Shirline Frees of oncology 3/19. CT Chest 06/13/22 shows new nodular densities in the right lung, 2.8x1.9cm in RUL, 1cm RLL and 1.3cm RLL. The multifocal groungglass opacities have resolved from prior CT in October. Area of arcitectural distortion with volume loss and traction bronchiectasis in LLL is unchanged. No enlarged mediastinal or hilar lymph nodes.   He feels sick to his stomach intermittently where he feels weak and yucky. He complains of chest pains and muscle aches. Last night he felt like he had chills. No sweats.    OV 05/02/22 Currently on prednisone 20mg  daily along with bactrim and continues to feel ok. He had covid a couple weeks ago and is feeling better from that.   OV 03/01/22 He was started on prednisone taper after last visit once biopsy results were confirmed with pathology indicating organizing pneumonia.   He was started on prednisone 60mg  daily for 1 month and has tapered down to 40mg  daily 11/21. He reports feeling much better. His cough has resolved and he is able to take deeper breaths.   Initial OV 01/30/22 He was admitted 10/3 to 10/4 for pneumonia with dense consolidations of the left lower lobe and small opacities in the right lower lobe periphery concerning for infectious pneumonia vs organizing pneumoina/pneumonitis in setting of nivolumab vs cancer progression. He was  initially treated with antibiotics and felt improved. He followed up on 10/30 with Rhunette Croft, NP where he did not feel much different after completing the antibiotics with on going cough.  Repeat CT Chest scan 01/23/22 showed progressive multilobar consolidative and ground glass opacities.   He was taken for bronchoscopy 01/25/22 where LUL and LLL transbronchial biopsies were performed along with LUL BAL. Transbronchial biopsies are negative for malignancy and I am awaiting response from pathology whether inflammation was noted. Cultures are negative to date. BAL cell count and differential showed lymphocyte predominance and 8% eosinophils.  Results were reviewed with patient with main concern for organizing pneumonia related to nivolumab.   Past Medical History:  Diagnosis Date   Frequent headaches    occipital region mostly: takes ibuprofen 800 mg bid most days   Palpitations    Zio patch reassuring 04/2021   Renal cancer 03/2021   Left->presented with bone met to L rib.   Renal mass 08/02/2021   Renal mass, left 04/20/2021   Tobacco dependence      Family History  Problem Relation Age of Onset   Arthritis Mother    Cancer Maternal Aunt    Heart attack Maternal Uncle    Heart attack Maternal Grandmother    Heart attack Maternal Grandfather    Heart attack Maternal Aunt      Social History   Socioeconomic History   Marital status: Married    Spouse name: Not on file   Number of children: Not on file   Years  of education: Not on file   Highest education level: Not on file  Occupational History   Not on file  Tobacco Use   Smoking status: Former    Packs/day: 1.00    Years: 20.00    Additional pack years: 0.00    Total pack years: 20.00    Types: Cigarettes    Quit date: 04/27/2021    Years since quitting: 1.1    Passive exposure: Never   Smokeless tobacco: Never  Vaping Use   Vaping Use: Never used  Substance and Sexual Activity   Alcohol use: No   Drug use: No    Sexual activity: Yes    Partners: Female  Other Topics Concern   Not on file  Social History Narrative   Married, 1 daughter and one son.   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   Orig from [place].   Education: HS.   Tob 20  pck-yr hx (current as of 03/2014).   Alc: none.  No hx of drug use.   Caffeine: 3-4 cups coffee/day, 1 cup tea per day.   No exercise.   Social Determinants of Health   Financial Resource Strain: Not on file  Food Insecurity: No Food Insecurity (12/27/2021)   Hunger Vital Sign    Worried About Running Out of Food in the Last Year: Never true    Ran Out of Food in the Last Year: Never true  Transportation Needs: No Transportation Needs (12/27/2021)   PRAPARE - Administrator, Civil Service (Medical): No    Lack of Transportation (Non-Medical): No  Physical Activity: Not on file  Stress: Not on file  Social Connections: Not on file  Intimate Partner Violence: Not At Risk (12/27/2021)   Humiliation, Afraid, Rape, and Kick questionnaire    Fear of Current or Ex-Partner: No    Emotionally Abused: No    Physically Abused: No    Sexually Abused: No     Allergies  Allergen Reactions   Opdivo [Nivolumab] Other (See Comments)    Acute lower back pain - see progress note from 07/20/2021 and 09/29/21 Patient able to complete infusion with additional fluids run concurrently.      Outpatient Medications Prior to Visit  Medication Sig Dispense Refill   acetaminophen (TYLENOL) 500 MG tablet Take 1,000 mg by mouth every 6 (six) hours as needed for moderate pain or headache.     predniSONE (DELTASONE) 10 MG tablet TAKE 1 TABLET (10 MG TOTAL) BY MOUTH DAILY WITH BREAKFAST. 30 tablet 1   predniSONE (DELTASONE) 5 MG tablet Take 1 tablet (5 mg total) by mouth daily with breakfast. 15 tablet 0   prochlorperazine (COMPAZINE) 10 MG tablet Take 1 tablet (10 mg total) by mouth every 6 (six) hours as needed for nausea or  vomiting. 30 tablet 0   sulfamethoxazole-trimethoprim (BACTRIM DS) 800-160 MG tablet Take 1 tablet by mouth 3 (three) times a week. 12 tablet 4   No facility-administered medications prior to visit.   Review of Systems  Constitutional:  Positive for malaise/fatigue. Negative for chills, fever and weight loss.       Weight gain  HENT:  Negative for congestion, sinus pain and sore throat.   Eyes: Negative.   Respiratory:  Negative for cough, hemoptysis, sputum production, shortness of breath and wheezing.   Cardiovascular:  Negative for chest pain, palpitations, orthopnea, claudication and leg swelling.  Gastrointestinal:  Positive for nausea. Negative for abdominal pain, heartburn and vomiting.  Genitourinary:  Negative.   Musculoskeletal:  Negative for joint pain and myalgias.  Skin:  Negative for rash.  Neurological:  Negative for weakness.  Endo/Heme/Allergies: Negative.   Psychiatric/Behavioral: Negative.     Objective:   Vitals:   07/06/22 0907  BP: 124/72  Pulse: 68  SpO2: 98%  Weight: 214 lb (97.1 kg)  Height:  (1.651 m)    Physical Exam Constitutional:      General: He is not in acute distress. HENT:     Head: Normocephalic and atraumatic.  Eyes:     Conjunctiva/sclera: Conjunctivae normal.  Cardiovascular:     Rate and Rhythm: Normal rate and regular rhythm.     Pulses: Normal pulses.     Heart sounds: Normal heart sounds. No murmur heard. Pulmonary:     Breath sounds: No wheezing, rhonchi or rales.  Musculoskeletal:     Right lower leg: No edema.     Left lower leg: No edema.  Lymphadenopathy:     Cervical: No cervical adenopathy.  Skin:    General: Skin is warm and dry.  Neurological:     General: No focal deficit present.     Mental Status: He is alert.    CBC    Component Value Date/Time   WBC 7.9 06/06/2022 1430   RBC 4.70 06/06/2022 1430   HGB 14.8 06/06/2022 1430   HGB 15.0 03/02/2022 1304   HCT 44.0 06/06/2022 1430   PLT 277  06/06/2022 1430   PLT 271 03/02/2022 1304   MCV 93.6 06/06/2022 1430   MCH 31.5 06/06/2022 1430   MCHC 33.6 06/06/2022 1430   RDW 12.5 06/06/2022 1430   LYMPHSABS 0.9 06/06/2022 1430   MONOABS 0.4 06/06/2022 1430   EOSABS 0.0 06/06/2022 1430   BASOSABS 0.0 06/06/2022 1430      Latest Ref Rng & Units 06/06/2022    2:30 PM 04/06/2022   10:33 AM 03/02/2022    1:04 PM  BMP  Glucose 70 - 99 mg/dL 161  84  096   BUN 6 - 20 mg/dL 18  20  32   Creatinine 0.61 - 1.24 mg/dL 0.45  4.09  8.11   BUN/Creat Ratio 6 - 22 (calc)  SEE NOTE:    Sodium 135 - 145 mmol/L 139  139  136   Potassium 3.5 - 5.1 mmol/L 4.9  5.0  4.7   Chloride 98 - 111 mmol/L 106  101  102   CO2 22 - 32 mmol/L Calcium 8.9 - 10.3 mg/dL 9.6  9.4  9.8     Chest imaging: CT Chest 06/13/22 1. Interval resolution of multifocal airspace and ground-glass opacities seen on previous chest CT with mild residual postinflammatory scarring. 2. Multiple indeterminate ill-defined nodular densities in the right lung are new from previous chest CT of 5 months ago and are suspicious for inflammatory nodules or atypical infection. Metastatic disease is not completely excluded but considered less likely given the unilateral nature of the nodules and rapid development from previous CT. Recommend chest CT follow-up in 3 months. 3. Stable appearance of treated left rib metastasis. No new osseous metastases or acute osseous findings. 4. Stable appearance of the nodule involving thyroid isthmus, previously evaluated by thyroid ultrasound and biopsy. Stable well-circumscribed left paratracheal nodule, potentially another ectopic thyroid nodule. 5.  Aortic Atherosclerosis  CT Chest 01/23/22 Mediastinum/Nodes: Mildly hypoattenuating exophytic nodule off the thyroid isthmus measures 1.6 x 2.8 cm, previously evaluated by ultrasound 01/13/2022. No followup  recommended. (Ref: J Am Coll Radiol. 2015 Feb;12(2): 143-50).Mediastinal  lymph nodes are not enlarged by CT size criteria. Hilar regions are difficult to evaluate without IV contrast. No axillary adenopathy. Esophagus is grossly unremarkable.   Lungs/Pleura: Worsening multi lobar consolidation and ground-glass, left greater than right. Small left pleural effusion. Airway is unremarkable.  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  He has completed steroid taper for organizing pneumonia with resolution of the multifocal airspace opacities and residual post inflammatory scarring of RLL.   He has new nodular densities in the right lung that appear more inflammatory or infectious but given his history of renal cell carcinoma malignancy is possible as well.  We discussed monitoring vs nav bronch for biopsies and BAL cultures. He would like to proceed with bronchoscopy. I have reached out to Dr. Delton Coombes and Dr. Tonia Brooms for scheduling of his procedure.   His symptoms of generally not feeling well with stomach upset are less likely due to renal insufficiency after steroid taper as he was experiencing these symptoms while on the taper. We will continue to monitor.  Follow up in 3 months.  Melody Comas, MD [facility] Office: [phone]    Current Outpatient Medications:    acetaminophen (TYLENOL) 500 MG tablet, Take 1,000 mg by mouth every 6 (six) hours as needed for moderate pain or headache., Disp: , Rfl:

## 2022-07-06 NOTE — Telephone Encounter (Signed)
Pre op calling to get orders put in for Monday there is none in the system yet

## 2022-07-07 ENCOUNTER — Encounter (HOSPITAL_COMMUNITY): Payer: Self-pay | Admitting: Emergency Medicine

## 2022-07-07 ENCOUNTER — Encounter: Payer: Self-pay | Admitting: Family Medicine

## 2022-07-07 ENCOUNTER — Encounter: Payer: Self-pay | Admitting: Pulmonary Disease

## 2022-07-07 DIAGNOSIS — R110 Nausea: Secondary | ICD-10-CM

## 2022-07-07 NOTE — Progress Notes (Signed)
PCP - Everrett Coombe DO Cardiologist - denies  EKG - 12/27/2021 Epic Chest x-ray - DOS in PACU ECHO - Denies Cardiac Cath - Denies  CPAP - Denies  DM Denies   Blood Thinner Instructions: Denies Aspirin Instructions: Denies  ERAS Protcol - NPO COVID TEST- 07/06/2022 at Dr Kavin Leech office  Anesthesia review: No  -------------  SDW INSTRUCTIONS:  Your procedure is scheduled on Monday April 15th. Please report to [facility] Main Entrance "A" at 0730 A.M., and check in at the Admitting office. Call this number if you have problems the morning of surgery: [phone]   Remember: Do not eat or drink after midnight the night before your surgery    Medications to take morning of surgery with a sip of water include: acetaminophen (TYLENOL) 500 MG  IF NEEDED  As of today, STOP taking any Aspirin (unless otherwise instructed by your surgeon), Aleve, Naproxen, Ibuprofen, Motrin, Advil, Goody's, BC's, all herbal medications, fish oil, and all vitamins.    The Morning of Surgery Do not wear jewelry, make-up or nail polish. Do not wear lotions, powders, or perfumes/colognes, or deodorant Do not bring valuables to the hospital. [facility] is not responsible for any belongings or valuables.  If you are a smoker, DO NOT Smoke 24 hours prior to surgery  If you wear a CPAP at night please bring your mask the morning of surgery   Remember that you must have someone to transport you home after your surgery, and remain with you for 24 hours if you are discharged the same day.  Please bring cases for contacts, glasses, hearing aids, dentures or bridgework because it cannot be worn into surgery.   Patients discharged the day of surgery will not be allowed to drive home.   Please shower the NIGHT BEFORE/MORNING OF SURGERY (use antibacterial soap like DIAL soap if possible). Wear comfortable clothes the morning of surgery. Oral Hygiene is also important to reduce your risk of infection.   Remember - BRUSH YOUR TEETH THE MORNING OF SURGERY WITH YOUR REGULAR TOOTHPASTE  Patient denies shortness of breath, fever, cough and chest pain.

## 2022-07-08 LAB — SPECIMEN STATUS REPORT

## 2022-07-08 LAB — NOVEL CORONAVIRUS, NAA: SARS-CoV-2, NAA: NOT DETECTED

## 2022-07-10 ENCOUNTER — Encounter (HOSPITAL_COMMUNITY)
Admission: RE | Disposition: A | Discharge status: Home / Self Care | Payer: Self-pay | Source: Ambulatory Visit | Attending: Emergency Medicine

## 2022-07-10 ENCOUNTER — Encounter (HOSPITAL_COMMUNITY): Payer: Self-pay | Admitting: Emergency Medicine

## 2022-07-10 ENCOUNTER — Ambulatory Visit (HOSPITAL_BASED_OUTPATIENT_CLINIC_OR_DEPARTMENT_OTHER): Payer: 59 | Admitting: Anesthesiology

## 2022-07-10 ENCOUNTER — Ambulatory Visit (HOSPITAL_COMMUNITY): Payer: 59

## 2022-07-10 ENCOUNTER — Other Ambulatory Visit: Payer: Self-pay

## 2022-07-10 ENCOUNTER — Ambulatory Visit (HOSPITAL_COMMUNITY)
Admission: RE | Admit: 2022-07-10 | Discharge: 2022-07-10 | Disposition: A | Discharge status: Home / Self Care | Payer: 59 | Source: Ambulatory Visit | Attending: Emergency Medicine | Admitting: Emergency Medicine

## 2022-07-10 ENCOUNTER — Ambulatory Visit (HOSPITAL_COMMUNITY): Payer: 59 | Admitting: Anesthesiology

## 2022-07-10 DIAGNOSIS — C7951 Secondary malignant neoplasm of bone: Secondary | ICD-10-CM | POA: Diagnosis not present

## 2022-07-10 DIAGNOSIS — Z85528 Personal history of other malignant neoplasm of kidney: Secondary | ICD-10-CM | POA: Insufficient documentation

## 2022-07-10 DIAGNOSIS — Z87891 Personal history of nicotine dependence: Secondary | ICD-10-CM | POA: Insufficient documentation

## 2022-07-10 DIAGNOSIS — R918 Other nonspecific abnormal finding of lung field: Secondary | ICD-10-CM | POA: Diagnosis present

## 2022-07-10 DIAGNOSIS — Z905 Acquired absence of kidney: Secondary | ICD-10-CM | POA: Insufficient documentation

## 2022-07-10 HISTORY — PX: BRONCHIAL BRUSHINGS: SHX5108

## 2022-07-10 HISTORY — DX: Pneumonia, unspecified organism: J18.9

## 2022-07-10 HISTORY — PX: BRONCHIAL NEEDLE ASPIRATION BIOPSY: SHX5106

## 2022-07-10 HISTORY — PX: VIDEO BRONCHOSCOPY WITH RADIAL ENDOBRONCHIAL ULTRASOUND: SHX6849

## 2022-07-10 HISTORY — PX: BRONCHIAL BIOPSY: SHX5109

## 2022-07-10 HISTORY — PX: BRONCHIAL WASHINGS: SHX5105

## 2022-07-10 LAB — COMPREHENSIVE METABOLIC PANEL
ALT: 25 U/L (ref 0–44)
AST: 28 U/L (ref 15–41)
Albumin: 3.4 g/dL — ABNORMAL LOW (ref 3.5–5.0)
Alkaline Phosphatase: 57 U/L (ref 38–126)
Anion gap: 10 (ref 5–15)
BUN: 17 mg/dL (ref 6–20)
CO2: 23 mmol/L (ref 22–32)
Calcium: 8.8 mg/dL — ABNORMAL LOW (ref 8.9–10.3)
Chloride: 105 mmol/L (ref 98–111)
Creatinine, Ser: 1.15 mg/dL (ref 0.61–1.24)
GFR, Estimated: >60 mL/min (ref >60)
Glucose, Bld: 82 mg/dL (ref 70–99)
Potassium: 3.8 mmol/L (ref 3.5–5.1)
Sodium: 138 mmol/L (ref 135–145)
Total Bilirubin: 0.9 mg/dL (ref 0.3–1.2)
Total Protein: 6.3 g/dL — ABNORMAL LOW (ref 6.5–8.1)

## 2022-07-10 LAB — CBC
HCT: 46.2 % (ref 39.0–52.0)
Hemoglobin: 15.2 g/dL (ref 13.0–17.0)
MCH: 31.3 pg (ref 26.0–34.0)
MCHC: 32.9 g/dL (ref 30.0–36.0)
MCV: 95.1 fL (ref 80.0–100.0)
Platelets: 264 10*3/uL (ref 150–400)
RBC: 4.86 MIL/uL (ref 4.22–5.81)
RDW: 12 % (ref 11.5–15.5)
WBC: 4.5 10*3/uL (ref 4.0–10.5)
nRBC: 0 % (ref 0.0–0.2)

## 2022-07-10 LAB — AEROBIC/ANAEROBIC CULTURE W GRAM STAIN (SURGICAL/DEEP WOUND)

## 2022-07-10 LAB — CULTURE, BAL-QUANTITATIVE W GRAM STAIN

## 2022-07-10 SURGERY — BRONCHOSCOPY, WITH BIOPSY USING ELECTROMAGNETIC NAVIGATION
Anesthesia: General | Laterality: Right

## 2022-07-10 MED ORDER — CHLORHEXIDINE GLUCONATE 0.12 % MT SOLN
OROMUCOSAL | Status: AC
  Administered 2022-07-10: 15 mL
  Filled 2022-07-10: qty 15

## 2022-07-10 MED ORDER — LACTATED RINGERS IV SOLN: INTRAVENOUS | Status: DC

## 2022-07-10 MED ORDER — ONDANSETRON HCL 4 MG/2ML IJ SOLN
INTRAMUSCULAR | Status: DC | PRN
  Administered 2022-07-10: 4 mg via INTRAVENOUS

## 2022-07-10 MED ORDER — PROPOFOL 10 MG/ML IV BOLUS
INTRAVENOUS | Status: DC | PRN
  Administered 2022-07-10: 150 mg via INTRAVENOUS
  Administered 2022-07-10: 125 ug/kg/min via INTRAVENOUS

## 2022-07-10 MED ORDER — LIDOCAINE 2% (20 MG/ML) 5 ML SYRINGE
INTRAMUSCULAR | Status: DC | PRN
  Administered 2022-07-10: 100 mg via INTRAVENOUS

## 2022-07-10 MED ORDER — PROMETHAZINE HCL 25 MG/ML IJ SOLN: 6.2500 mg | INTRAMUSCULAR | Status: DC | PRN

## 2022-07-10 MED ORDER — SUGAMMADEX SODIUM 200 MG/2ML IV SOLN
INTRAVENOUS | Status: DC | PRN
  Administered 2022-07-10: 400 mg via INTRAVENOUS

## 2022-07-10 MED ORDER — PHENYLEPHRINE 80 MCG/ML (10ML) SYRINGE FOR IV PUSH (FOR BLOOD PRESSURE SUPPORT)
PREFILLED_SYRINGE | INTRAVENOUS | Status: DC | PRN
  Administered 2022-07-10: 80 ug via INTRAVENOUS

## 2022-07-10 MED ORDER — AMISULPRIDE (ANTIEMETIC) 5 MG/2ML IV SOLN: 10.0000 mg | Freq: Once | INTRAVENOUS | Status: DC | PRN

## 2022-07-10 MED ORDER — OXYCODONE HCL 5 MG/5ML PO SOLN: 5.0000 mg | Freq: Once | ORAL | Status: DC | PRN

## 2022-07-10 MED ORDER — HYDROMORPHONE HCL 1 MG/ML IJ SOLN: 0.2500 mg | INTRAMUSCULAR | Status: DC | PRN

## 2022-07-10 MED ORDER — MEPERIDINE HCL 25 MG/ML IJ SOLN: 6.2500 mg | INTRAMUSCULAR | Status: DC | PRN

## 2022-07-10 MED ORDER — OXYCODONE HCL 5 MG PO TABS: 5.0000 mg | ORAL_TABLET | Freq: Once | ORAL | Status: DC | PRN

## 2022-07-10 MED ORDER — FENTANYL CITRATE (PF) 250 MCG/5ML IJ SOLN
INTRAMUSCULAR | Status: DC | PRN
  Administered 2022-07-10: 100 ug via INTRAVENOUS

## 2022-07-10 MED ORDER — PHENYLEPHRINE HCL-NACL 20-0.9 MG/250ML-% IV SOLN
INTRAVENOUS | Status: DC | PRN
  Administered 2022-07-10: 40 ug/min via INTRAVENOUS

## 2022-07-10 MED ORDER — ROCURONIUM BROMIDE 10 MG/ML (PF) SYRINGE
PREFILLED_SYRINGE | INTRAVENOUS | Status: DC | PRN
  Administered 2022-07-10: 70 mg via INTRAVENOUS
  Administered 2022-07-10: 30 mg via INTRAVENOUS

## 2022-07-10 NOTE — Op Note (Signed)
Video Bronchoscopy with Robotic Assisted Bronchoscopic Navigation   Date of Operation: 07/10/2022   Pre-op Diagnosis: Right upper lobe and right lower lobe pulmonary nodules  Post-op Diagnosis: Same  Surgeon: Levy Pupa  Assistants: None  Anesthesia: General endotracheal anesthesia  Operation: Flexible video fiberoptic bronchoscopy with robotic assistance and biopsies.  Estimated Blood Loss: Minimal  Complications: None  Indications and History: Gordon Mcclain is a 47 y.o. male with history of metastatic renal cell cancer, cryptogenic organizing pneumonia for which he has been treated with corticosteroids.  Found to have new focal nodular infiltrates on the right side on repeat CT scan of the chest.  Recommendation made to achieve a tissue diagnosis and culture data via robotic assisted navigational bronchoscopy. The risks, benefits, complications, treatment options and expected outcomes were discussed with the patient.  The possibilities of pneumothorax, pneumonia, reaction to medication, pulmonary aspiration, perforation of a viscus, bleeding, failure to diagnose a condition and creating a complication requiring transfusion or operation were discussed with the patient who freely signed the consent.    Description of Procedure: The patient was seen in the Preoperative Area, was examined and was deemed appropriate to proceed.  The patient was taken to [facility] endoscopy room 3, identified as [name] and the procedure verified as Flexible Video Fiberoptic Bronchoscopy.  A Time Out was held and the above information confirmed.   Prior to the date of the procedure a high-resolution CT scan of the chest was performed. Utilizing ION software program a virtual tracheobronchial tree was generated to allow the creation of distinct navigation pathways to the patient's parenchymal abnormalities. After being taken to the operating room general anesthesia was initiated and the patient  was  orally intubated. The video fiberoptic bronchoscope was introduced via the endotracheal tube and a general inspection was performed which showed normal right and left lung anatomy. Aspiration of the bilateral mainstems was completed to remove any remaining secretions. Robotic catheter inserted into patient's endotracheal tube.   Target #1 right lower lobe pulmonary nodule: The distinct navigation pathways prepared prior to this procedure were then utilized to navigate to patient's lesion identified on CT scan. The robotic catheter was secured into place and the vision probe was withdrawn.  Lesion location was approximated using fluoroscopy for peripheral targeting.  Local registration and targeting was performed using Cios three-dimensional imaging.  Under fluoroscopic guidance transbronchial brushings, transbronchial needle biopsies, and transbronchial forceps biopsies were performed to be sent for cytology and pathology.   Target #3 right upper lobe pulmonary nodule: The distinct navigation pathways prepared prior to this procedure were then utilized to navigate to patient's lesion identified on CT scan. The robotic catheter was secured into place and the vision probe was withdrawn.  Lesion location was approximated using fluoroscopy for peripheral targeting. Local registration and targeting was performed using Cios three-dimensional imaging. Under fluoroscopic guidance transbronchial needle biopsies, and transbronchial forceps biopsies were performed to be sent for cytology and pathology. A bronchioalveolar lavage was performed in the right upper lobe and sent for microbiology.  At the end of the procedure a general airway inspection was performed and there was no evidence of active bleeding. The bronchoscope was removed.  The patient tolerated the procedure well. There was no significant blood loss and there were no obvious complications. A post-procedural chest x-ray is pending.  Samples Target  #1: 1. Transbronchial brushings from right lower lobe pulmonary nodule 2. Transbronchial Wang needle biopsies from right lower lobe pulmonary nodule 3. Transbronchial forceps biopsies  from right lower lobe pulmonary nodule  Samples Target #3: 1. Transbronchial Wang needle biopsies from right upper lobe pulmonary nodule 2. Transbronchial forceps biopsies from right upper lobe pulmonary nodule 3. Bronchoalveolar lavage from right upper lobe  Plans:  The patient will be discharged from the PACU to home when recovered from anesthesia and after chest x-ray is reviewed. We will review the cytology, pathology and microbiology results with the patient when they become available. Outpatient followup will be with Dr. Francine Graven.    Levy Pupa, MD, PhD 07/10/2022, 12:02 PM [facility] [phone] or if no answer before 7:00PM call [phone] For any issues after 7:00PM please call eLink [phone]

## 2022-07-10 NOTE — Discharge Instructions (Addendum)
Flexible Bronchoscopy, Care After This sheet gives you information about how to care for yourself after your test. Your doctor may also give you more specific instructions. If you have problems or questions, contact your doctor. Follow these instructions at home: Eating and drinking When your numbness is gone and your cough and gag reflexes have come back, you may: Eat only soft foods. Slowly drink liquids. The day after the test, go back to your normal diet. Driving Do not drive for 24 hours if you were given a medicine to help you relax (sedative). Do not drive or use heavy machinery while taking prescription pain medicine. General instructions  Take over-the-counter and prescription medicines only as told by your doctor. Return to your normal activities as told. Ask what activities are safe for you. Do not use any products that have nicotine or tobacco in them. This includes cigarettes and e-cigarettes. If you need help quitting, ask your doctor. Keep all follow-up visits as told by your doctor. This is important. It is very important if you had a tissue sample (biopsy) taken. Get help right away if: You have shortness of breath that gets worse. You get light-headed. You feel like you are going to pass out (faint). You have chest pain. You cough up: More than a little blood. More blood than before. Summary Do not eat or drink anything (not even water) for 2 hours after your test, or until your numbing medicine wears off. Do not use cigarettes. Do not use e-cigarettes. Get help right away if you have chest pain.  Please call our office for any questions or concerns.  [phone].  This information is not intended to replace advice given to you by your health care provider. Make sure you discuss any questions you have with your health care provider. Document Released: 01/08/2009 Document Revised: 02/23/2017 Document Reviewed: 03/31/2016 Elsevier Patient Education  2020 Elsevier  Inc.  

## 2022-07-10 NOTE — Interval H&P Note (Signed)
History and Physical Interval Note:  07/10/2022 8:47 AM  Gordon Mcclain  has presented today for surgery, with the diagnosis of RIGHT PULMONARY NODULES.  The various methods of treatment have been discussed with the patient and family. After consideration of risks, benefits and other options for treatment, the patient has consented to  Procedure(s): ROBOTIC ASSISTED NAVIGATIONAL BRONCHOSCOPY (Right) as a surgical intervention.  The patient's history has been reviewed, patient examined, no change in status, stable for surgery.  I have reviewed the patient's chart and labs.  Questions were answered to the patient's satisfaction.     Leslye Peer

## 2022-07-10 NOTE — Anesthesia Preprocedure Evaluation (Signed)
Anesthesia Evaluation  Patient identified by MRN, date of birth, ID band Patient awake    Reviewed: Allergy & Precautions, NPO status , Patient's Chart, lab work & pertinent test results  Airway Mallampati: I  TM Distance: >3 FB Neck ROM: Full    Dental  (+) Missing   Pulmonary former smoker   Pulmonary exam normal        Cardiovascular negative cardio ROS Normal cardiovascular exam     Neuro/Psych  Headaches  negative psych ROS   GI/Hepatic negative GI ROS, Neg liver ROS,,,  Endo/Other  negative endocrine ROS    Renal/GU Renal diseaseRenal cancer     Musculoskeletal negative musculoskeletal ROS (+)    Abdominal  (+) + obese  Peds  Hematology  (+) Blood dyscrasia, anemia   Anesthesia Other Findings chronic cough  Reproductive/Obstetrics                             Anesthesia Physical Anesthesia Plan  ASA: 2  Anesthesia Plan: General   Post-op Pain Management: Minimal or no pain anticipated   Induction: Intravenous  PONV Risk Score and Plan: 2 and Ondansetron, Midazolam and Treatment may vary due to age or medical condition  Airway Management Planned: Oral ETT  Additional Equipment:   Intra-op Plan:   Post-operative Plan: Extubation in OR  Informed Consent: I have reviewed the patients History and Physical, chart, labs and discussed the procedure including the risks, benefits and alternatives for the proposed anesthesia with the patient or authorized representative who has indicated his/her understanding and acceptance.     Dental advisory given  Plan Discussed with: CRNA  Anesthesia Plan Comments:        Anesthesia Quick Evaluation

## 2022-07-10 NOTE — Anesthesia Procedure Notes (Signed)
Procedure Name: Intubation Date/Time: 07/10/2022 10:47 AM  Performed by: Sharyn Dross, CRNAPre-anesthesia Checklist: Patient identified, Emergency Drugs available, Suction available and Patient being monitored Patient Re-evaluated:Patient Re-evaluated prior to induction Oxygen Delivery Method: Circle system utilized Preoxygenation: Pre-oxygenation with 100% oxygen Induction Type: IV induction Ventilation: Mask ventilation without difficulty Laryngoscope Size: Mac and 4 Grade View: Grade II Tube type: Oral Tube size: 8.5 mm Number of attempts: 1 Airway Equipment and Method: Stylet and Oral airway Placement Confirmation: ETT inserted through vocal cords under direct vision, positive ETCO2 and breath sounds checked- equal and bilateral Secured at: 22 cm Tube secured with: Tape Dental Injury: Teeth and Oropharynx as per pre-operative assessment

## 2022-07-10 NOTE — Anesthesia Preprocedure Evaluation (Deleted)
Anesthesia Evaluation    Airway        Dental   Pulmonary former smoker          Cardiovascular      Neuro/Psych    GI/Hepatic   Endo/Other    Renal/GU      Musculoskeletal   Abdominal   Peds  Hematology   Anesthesia Other Findings   Reproductive/Obstetrics                              Anesthesia Physical Anesthesia Plan Anesthesia Quick Evaluation  

## 2022-07-10 NOTE — Transfer of Care (Signed)
Immediate Anesthesia Transfer of Care Note  Patient: Gordon Mcclain  Procedure(s) Performed: ROBOTIC ASSISTED NAVIGATIONAL BRONCHOSCOPY (Right) BRONCHIAL BIOPSIES BRONCHIAL NEEDLE ASPIRATION BIOPSIES BRONCHIAL WASHINGS BRONCHIAL BRUSHINGS VIDEO BRONCHOSCOPY WITH RADIAL ENDOBRONCHIAL ULTRASOUND  Patient Location: Endoscopy Unit  Anesthesia Type:General  Level of Consciousness: awake, alert , and oriented  Airway & Oxygen Therapy: Patient Spontanous Breathing and Patient connected to nasal cannula oxygen  Post-op Assessment: Report given to RN and Post -op Vital signs reviewed and stable  Post vital signs: Reviewed and stable  Last Vitals:  Vitals Value Taken Time  BP 90/59 07/10/22 1209  Temp    Pulse 66 07/10/22 1212  Resp 9 07/10/22 1212  SpO2 100 % 07/10/22 1212  Vitals shown include unvalidated device data.  Last Pain:  Vitals:   07/10/22 0811  TempSrc:   PainSc: 0-No pain         Complications: There were no known notable events for this encounter.

## 2022-07-10 NOTE — Anesthesia Postprocedure Evaluation (Signed)
Anesthesia Post Note  Patient: Gordon Mcclain  Procedure(s) Performed: ROBOTIC ASSISTED NAVIGATIONAL BRONCHOSCOPY (Right) BRONCHIAL BIOPSIES BRONCHIAL NEEDLE ASPIRATION BIOPSIES BRONCHIAL WASHINGS BRONCHIAL BRUSHINGS VIDEO BRONCHOSCOPY WITH RADIAL ENDOBRONCHIAL ULTRASOUND     Patient location during evaluation: PACU Anesthesia Type: General Level of consciousness: awake and alert Pain management: pain level controlled Vital Signs Assessment: post-procedure vital signs reviewed and stable Respiratory status: spontaneous breathing, nonlabored ventilation and respiratory function stable Cardiovascular status: blood pressure returned to baseline and stable Postop Assessment: no apparent nausea or vomiting Anesthetic complications: no   There were no known notable events for this encounter.  Last Vitals:  Vitals:   07/10/22 1300 07/10/22 1315  BP: 104/72 101/67  Pulse: (!) 59 (!) 57  Resp: 13 18  Temp:    SpO2: 99% 96%    Last Pain:  Vitals:   07/10/22 1315  TempSrc:   PainSc: 0-No pain                 Lowella Curb

## 2022-07-11 ENCOUNTER — Encounter (HOSPITAL_COMMUNITY): Payer: Self-pay | Admitting: Emergency Medicine

## 2022-07-11 LAB — CYTOLOGY - NON PAP

## 2022-07-11 LAB — CULTURE, BAL-QUANTITATIVE W GRAM STAIN: Culture: NO GROWTH

## 2022-07-12 ENCOUNTER — Telehealth: Payer: Self-pay | Admitting: Emergency Medicine

## 2022-07-12 NOTE — Telephone Encounter (Signed)
Reviewed transbronchial biopsies with the patient's wife by phone.  Samples obtained from the largest right lower lobe and right upper lobe nodular lesion showed organizing pneumonia.  Cultures are all still pending.  No evidence of malignancy.  Unclear cause.  He was treated for similar phenomenon on the left.  He is currently off steroids, not receiving his chemotherapy.  Advised her that we will need to follow the cultures closely to ensure that they are negative then make decisions about treatment.  Will send to Dr. Andrey Farmer

## 2022-07-13 ENCOUNTER — Encounter: Payer: Self-pay | Admitting: Internal Medicine

## 2022-07-13 LAB — AEROBIC/ANAEROBIC CULTURE W GRAM STAIN (SURGICAL/DEEP WOUND)

## 2022-07-13 LAB — ACID FAST SMEAR (AFB, MYCOBACTERIA): Acid Fast Smear: NEGATIVE

## 2022-07-13 NOTE — Telephone Encounter (Signed)
Referral, clinical notes and copies of insurance cards have been faxed to [facility] at [phone]. Office will contact patient to schedule referral appointment.

## 2022-07-15 LAB — AEROBIC/ANAEROBIC CULTURE W GRAM STAIN (SURGICAL/DEEP WOUND): Culture: NO GROWTH

## 2022-07-17 ENCOUNTER — Other Ambulatory Visit: Payer: Self-pay | Admitting: Physician Assistant

## 2022-07-19 ENCOUNTER — Encounter: Payer: Self-pay | Admitting: Pulmonary Disease

## 2022-07-19 LAB — FUNGUS CULTURE WITH STAIN

## 2022-07-19 NOTE — Telephone Encounter (Signed)
Dr. Francine Graven, please see mychart message and advise on when patient needs f/u after Bronch.  Your last OV states 3 months.  Does he need to f/u with you or Dr. Delton Coombes.  Please advise.  Thank you.

## 2022-07-27 ENCOUNTER — Telehealth: Payer: Self-pay | Admitting: Internal Medicine

## 2022-07-27 NOTE — Telephone Encounter (Signed)
Called patient regarding upcoming May appointments, patient is notified.  ?

## 2022-08-03 LAB — HM COLONOSCOPY

## 2022-08-09 ENCOUNTER — Encounter: Payer: Self-pay | Admitting: Internal Medicine

## 2022-08-10 ENCOUNTER — Other Ambulatory Visit: Payer: Self-pay

## 2022-08-10 ENCOUNTER — Ambulatory Visit (HOSPITAL_COMMUNITY)
Admission: RE | Admit: 2022-08-10 | Discharge: 2022-08-10 | Disposition: A | Discharge status: Home / Self Care | Payer: 59 | Source: Ambulatory Visit | Attending: Internal Medicine | Admitting: Internal Medicine

## 2022-08-10 ENCOUNTER — Inpatient Hospital Stay: Payer: 59 | Attending: Physician Assistant

## 2022-08-10 DIAGNOSIS — Z905 Acquired absence of kidney: Secondary | ICD-10-CM | POA: Diagnosis not present

## 2022-08-10 DIAGNOSIS — C642 Malignant neoplasm of left kidney, except renal pelvis: Secondary | ICD-10-CM | POA: Insufficient documentation

## 2022-08-10 DIAGNOSIS — C7951 Secondary malignant neoplasm of bone: Secondary | ICD-10-CM | POA: Diagnosis present

## 2022-08-10 DIAGNOSIS — Z923 Personal history of irradiation: Secondary | ICD-10-CM | POA: Insufficient documentation

## 2022-08-10 DIAGNOSIS — C649 Malignant neoplasm of unspecified kidney, except renal pelvis: Secondary | ICD-10-CM

## 2022-08-10 LAB — CBC WITH DIFFERENTIAL (CANCER CENTER ONLY)
Abs Immature Granulocytes: 0.03 10*3/uL (ref 0.00–0.07)
Basophils Absolute: 0.1 10*3/uL (ref 0.0–0.1)
Basophils Relative: 1 %
Eosinophils Absolute: 0.1 10*3/uL (ref 0.0–0.5)
Eosinophils Relative: 2 %
HCT: 45.3 % (ref 39.0–52.0)
Hemoglobin: 15.2 g/dL (ref 13.0–17.0)
Immature Granulocytes: 1 %
Lymphocytes Relative: 23 %
Lymphs Abs: 1.2 10*3/uL (ref 0.7–4.0)
MCH: 31.1 pg (ref 26.0–34.0)
MCHC: 33.6 g/dL (ref 30.0–36.0)
MCV: 92.8 fL (ref 80.0–100.0)
Monocytes Absolute: 0.7 10*3/uL (ref 0.1–1.0)
Monocytes Relative: 12 %
Neutro Abs: 3.2 10*3/uL (ref 1.7–7.7)
Neutrophils Relative %: 61 %
Platelet Count: 237 10*3/uL (ref 150–400)
RBC: 4.88 MIL/uL (ref 4.22–5.81)
RDW: 11.7 % (ref 11.5–15.5)
WBC Count: 5.3 10*3/uL (ref 4.0–10.5)
nRBC: 0 % (ref 0.0–0.2)

## 2022-08-10 LAB — CMP (CANCER CENTER ONLY)
ALT: 26 U/L (ref 0–44)
AST: 28 U/L (ref 15–41)
Albumin: 4.1 g/dL (ref 3.5–5.0)
Alkaline Phosphatase: 76 U/L (ref 38–126)
Anion gap: 6 (ref 5–15)
BUN: 17 mg/dL (ref 6–20)
CO2: 28 mmol/L (ref 22–32)
Calcium: 9.2 mg/dL (ref 8.9–10.3)
Chloride: 105 mmol/L (ref 98–111)
Creatinine: 1.11 mg/dL (ref 0.61–1.24)
GFR, Estimated: >60 mL/min (ref >60)
Glucose, Bld: 71 mg/dL (ref 70–99)
Potassium: 4.1 mmol/L (ref 3.5–5.1)
Sodium: 139 mmol/L (ref 135–145)
Total Bilirubin: 0.7 mg/dL (ref 0.3–1.2)
Total Protein: 6.8 g/dL (ref 6.5–8.1)

## 2022-08-15 ENCOUNTER — Other Ambulatory Visit: Payer: Self-pay

## 2022-08-15 ENCOUNTER — Inpatient Hospital Stay: Payer: 59 | Admitting: Internal Medicine

## 2022-08-15 VITALS — BP 124/69 | HR 82 | Temp 97.9°F | Resp 18 | Wt 212.2 lb

## 2022-08-15 DIAGNOSIS — C7951 Secondary malignant neoplasm of bone: Secondary | ICD-10-CM | POA: Diagnosis not present

## 2022-08-15 DIAGNOSIS — C649 Malignant neoplasm of unspecified kidney, except renal pelvis: Secondary | ICD-10-CM | POA: Diagnosis not present

## 2022-08-15 DIAGNOSIS — C642 Malignant neoplasm of left kidney, except renal pelvis: Secondary | ICD-10-CM | POA: Diagnosis not present

## 2022-08-15 LAB — FUNGUS CULTURE WITH STAIN

## 2022-08-15 LAB — FUNGAL ORGANISM REFLEX

## 2022-08-15 LAB — FUNGUS CULTURE RESULT

## 2022-08-15 NOTE — Progress Notes (Signed)
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  PRIOR THERAPY: Status post left hip surgery completed in March 2023 with left hemiarthroplasty. Status post palliative radiotherapy to the left ribs completed in February 2023. Status post treatment with immunotherapy with ipilimumab 1 Mg/KG and nivolumab 3 mg/KG started May 18, 2021 for 4 cycles completed on July 20, 2021. Status post radiotherapy to the left hip completed in May 2023. Status post left radical nephrectomy on Aug 02, 2021 and the final pathology showed predominantly extensive necrosis with 10% of the tumor cells viable indicating T1b. Status post maintenance treatment with immunotherapy with nivolumab 480 Mg IV started Aug 24, 2021 and discontinued in August 2023 secondary to suspicious immunotherapy mediated pneumonitis.  Followed by prednisone taper for the treatment of the pneumonitis.  CURRENT THERAPY: Observation  INTERVAL HISTORY: Gordon Mcclain 47 y.o. male returns to the clinic today for follow-up visit accompanied by his wife.  The patient is feeling fine today with no concerning complaints except for some aching pain in his ankle as well as the thigh area.  He denied having any current chest pain but has shortness of breath with exertion with no cough or hemoptysis.  He has no nausea, vomiting, diarrhea or constipation.  He has no headache or visual changes.  He denied having any fever or chills.  He has been in observation and he is here today for evaluation with repeat CT scan of the chest, abdomen and pelvis for restaging of his disease.  MEDICAL HISTORY: Past Medical History:  Diagnosis Date   Frequent headaches    occipital region mostly: takes ibuprofen 800 mg  bid most days   Palpitations    Zio patch reassuring 04/2021   Pneumonia    Renal cancer (HCC) 03/2021   Left->presented with bone met to L rib.   Renal mass 08/02/2021   Renal mass, left 04/20/2021   Tobacco dependence     ALLERGIES:  is allergic to opdivo [nivolumab].  MEDICATIONS:  Current Outpatient Medications  Medication Sig Dispense Refill   acetaminophen (TYLENOL) 500 MG tablet Take 1,000 mg by mouth every 6 (six) hours as needed for moderate pain or headache.     No current facility-administered medications for this visit.    SURGICAL HISTORY:  Past Surgical History:  Procedure Laterality Date   BRONCHIAL BIOPSY  01/25/2022   Procedure: BRONCHIAL BIOPSIES;  Surgeon: Martina Sinner, MD;  Location: [facility] ENDOSCOPY;  Service: Pulmonary;;   BRONCHIAL BIOPSY  07/10/2022   Procedure: BRONCHIAL BIOPSIES;  Surgeon: Leslye Peer, MD;  Location: [facility] ENDOSCOPY;  Service: Pulmonary;;   BRONCHIAL BRUSHINGS  07/10/2022   Procedure: BRONCHIAL BRUSHINGS;  Surgeon: Leslye Peer, MD;  Location: [facility] ENDOSCOPY;  Service: Pulmonary;;   BRONCHIAL NEEDLE ASPIRATION BIOPSY  07/10/2022   Procedure: BRONCHIAL NEEDLE ASPIRATION BIOPSIES;  Surgeon: Leslye Peer, MD;  Location: [facility] ENDOSCOPY;  Service: Pulmonary;;   BRONCHIAL WASHINGS  01/25/2022   Procedure: BRONCHIAL WASHINGS;  Surgeon: Martina Sinner, MD;  Location: [facility] ENDOSCOPY;  Service: Pulmonary;;   BRONCHIAL WASHINGS  07/10/2022   Procedure: BRONCHIAL WASHINGS;  Surgeon: Leslye Peer, MD;  Location: [facility] ENDOSCOPY;  Service: Pulmonary;;   PARTIAL HIP ARTHROPLASTY Left    PILONIDAL CYST EXCISION  approx 2002   ROBOT ASSISTED LAPAROSCOPIC NEPHRECTOMY Left 08/02/2021   Procedure: XI ROBOTIC ASSISTED LAPAROSCOPIC RADICAL NEPHRECTOMY;  Surgeon: Rene Paci, MD;  Location: [facility] ORS;  Service: Urology;  Laterality: Left;   VIDEO BRONCHOSCOPY N/A 01/25/2022   Procedure: VIDEO BRONCHOSCOPY WITH FLUORO;  Surgeon: Martina Sinner,  MD;  Location: [facility] ENDOSCOPY;  Service: Pulmonary;  Laterality: N/A;   VIDEO BRONCHOSCOPY WITH RADIAL ENDOBRONCHIAL ULTRASOUND  07/10/2022   Procedure: VIDEO BRONCHOSCOPY WITH RADIAL ENDOBRONCHIAL ULTRASOUND;  Surgeon: Leslye Peer, MD;  Location: [facility] ENDOSCOPY;  Service: Pulmonary;;   WISDOM TOOTH EXTRACTION     zio patch     04/2021 zio reassuring.    REVIEW OF SYSTEMS:  Constitutional: positive for fatigue Eyes: negative Ears, nose, mouth, throat, and face: negative Respiratory: negative Cardiovascular: negative Gastrointestinal: negative Genitourinary:negative Integument/breast: negative Hematologic/lymphatic: negative Musculoskeletal:positive for arthralgias Neurological: negative Behavioral/Psych: negative Endocrine: negative Allergic/Immunologic: negative   PHYSICAL EXAMINATION: General appearance: alert, cooperative, fatigued, and no distress Head: Normocephalic, without obvious abnormality, atraumatic Neck: no adenopathy, no JVD, supple, symmetrical, trachea midline, and thyroid not enlarged, symmetric, no tenderness/mass/nodules Lymph nodes: Cervical, supraclavicular, and axillary nodes normal. Resp: clear to auscultation bilaterally Back: symmetric, no curvature. ROM normal. No CVA tenderness. Cardio: regular rate and rhythm, S1, S2 normal, no murmur, click, rub or gallop GI: soft, non-tender; bowel sounds normal; no masses,  no organomegaly Extremities: extremities normal, atraumatic, no cyanosis or edema Neurologic: Alert and oriented X 3, normal strength and tone. Normal symmetric reflexes. Normal coordination and gait  ECOG PERFORMANCE STATUS: 1 - Symptomatic but completely ambulatory  Blood pressure 124/69, pulse 82, temperature 97.9 F (36.6 C), temperature source Temporal, resp. rate 18, weight 212 lb 3.2 oz (96.3 kg), SpO2 97 %.  LABORATORY DATA: Lab Results  Component Value Date   WBC 5.3 08/10/2022   HGB 15.2 08/10/2022   HCT 45.3 08/10/2022   MCV 92.8  08/10/2022   PLT 237 08/10/2022      Chemistry      Component Value Date/Time   NA 139 08/10/2022 1128   K 4.1 08/10/2022 1128   CL 105 08/10/2022 1128   CO2 28 08/10/2022 1128   BUN 17 08/10/2022 1128   CREATININE 1.11 08/10/2022 1128   CREATININE 1.19 04/06/2022 1033      Component Value Date/Time   CALCIUM 9.2 08/10/2022 1128   ALKPHOS 76 08/10/2022 1128   AST 28 08/10/2022 1128   ALT 26 08/10/2022 1128   BILITOT 0.7 08/10/2022 1128       RADIOGRAPHIC STUDIES: CT Chest Wo Contrast  Result Date: 08/14/2022 CLINICAL DATA:  Restaging metastatic renal cell carcinoma. * Tracking Code: BO * EXAM: CT CHEST, ABDOMEN AND PELVIS WITHOUT CONTRAST TECHNIQUE: Multidetector CT imaging of the chest, abdomen and pelvis was performed following the standard protocol without IV contrast. RADIATION DOSE REDUCTION: This exam was performed according to the departmental dose-optimization program which includes automated exposure control, adjustment of the mA and/or kV according to patient size and/or use of iterative reconstruction technique. COMPARISON:  Multiple prior imaging studies. The most recent chest CT is 07/10/2022 and the most recent abdominal CT scan is 06/06/2022 FINDINGS: CT CHEST FINDINGS Cardiovascular: The heart is normal in size. No pericardial effusion. The aorta is normal in caliber. Stable minimal atherosclerotic calcification at the aortic arch. Minimal scattered coronary artery calcifications. Mediastinum/Nodes: Stable 12 mm left paratracheal node on image 14/2. No other enlarged mediastinal or hilar lymph nodes. The esophagus is unremarkable. Stable 2.4 cm isthmic  thyroid lesion. This has been evaluated on previous imaging. (ref: J Am Coll Radiol. 2015 Feb;12(2): 143-50). Lungs/Pleura: Persistent multifocal airspace opacities with air bronchograms most likely infectious or inflammatory process. No significant interval change when compared to the prior recent CT scan from 1 month ago.  The right lower lobe lesion appears less solid and has more ground-glass opacity and more prominent air bronchograms. No pleural effusions. The central tracheobronchial tree is unremarkable. Musculoskeletal: Stable appearing lytic and sclerotic changes involving the left seventh rib. No new rib lesions. No vertebral body lesions are identified. No sternal lesions. CT ABDOMEN PELVIS FINDINGS Hepatobiliary: No hepatic lesions are identified without contrast. No intrahepatic biliary dilatation. The gallbladder is unremarkable. No common bile duct dilatation. Pancreas: No mass, inflammation or ductal dilatation. Spleen: Normal size.  No focal lesions. Adrenals/Urinary Tract: Adrenal glands are unremarkable. Status post left nephrectomy. The right kidney is unremarkable. The bladder is normal. Stomach/Bowel: The stomach, duodenum, small bowel and colon are unremarkable. Vascular/Lymphatic: Scattered age advanced atherosclerotic calcification involving the aorta and iliac arteries. No aneurysm. No mesenteric or retroperitoneal mass or adenopathy. Reproductive: The prostate gland and seminal vesicles are unremarkable. Other: No pelvic mass or adenopathy. No free pelvic fluid collections. No inguinal mass or adenopathy. No abdominal wall hernia or subcutaneous lesions. 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 No worrisome lytic or sclerotic bone lesions. Stable left hip arthroplasty. IMPRESSION: 1. Persistent multifocal airspace opacities with air bronchograms most likely infectious or inflammatory process. No significant interval change when compared to the prior recent CT scan from 1 month ago. 2. Stable 12 mm left paratracheal lymph node. 3. Stable appearing lytic and sclerotic changes involving the left seventh rib. No new rib lesions. 4. No findings for abdominal/pelvic metastatic disease. 5. Status post left nephrectomy. 6. Age advanced atherosclerotic calcification involving the  thoracic and abdominal aorta and iliac arteries. 7. Stable 2.4 cm isthmic thyroid lesion. This has been evaluated on previous imaging. Electronically Signed   By: Rudie Meyer M.D.   On: 08/14/2022 11:35   CT Abdomen Pelvis Wo Contrast  Result Date: 08/14/2022 CLINICAL DATA:  Restaging metastatic renal cell carcinoma. * Tracking Code: BO * EXAM: CT CHEST, ABDOMEN AND PELVIS WITHOUT CONTRAST TECHNIQUE: Multidetector CT imaging of the chest, abdomen and pelvis was performed following the standard protocol without IV contrast. RADIATION DOSE REDUCTION: This exam was performed according to the departmental dose-optimization program which includes automated exposure control, adjustment of the mA and/or kV according to patient size and/or use of iterative reconstruction technique. COMPARISON:  Multiple prior imaging studies. The most recent chest CT is 07/10/2022 and the most recent abdominal CT scan is 06/06/2022 FINDINGS: CT CHEST FINDINGS Cardiovascular: The heart is normal in size. No pericardial effusion. The aorta is normal in caliber. Stable minimal atherosclerotic calcification at the aortic arch. Minimal scattered coronary artery calcifications. Mediastinum/Nodes: Stable 12 mm left paratracheal node on image 14/2. No other enlarged mediastinal or hilar lymph nodes. The esophagus is unremarkable. Stable 2.4 cm isthmic thyroid lesion. This has been evaluated on previous imaging. (ref: J Am Coll Radiol. 2015 Feb;12(2): 143-50). Lungs/Pleura: Persistent multifocal airspace opacities with air bronchograms most likely infectious or inflammatory process. No significant interval change when compared to the prior recent CT scan from 1 month ago. The right lower lobe lesion appears less solid and has more ground-glass opacity and more prominent air bronchograms. No pleural effusions. The central tracheobronchial tree is unremarkable. Musculoskeletal: Stable appearing lytic and sclerotic changes involving the  left  seventh rib. No new rib lesions. No vertebral body lesions are identified. No sternal lesions. CT ABDOMEN PELVIS FINDINGS Hepatobiliary: No hepatic lesions are identified without contrast. No intrahepatic biliary dilatation. The gallbladder is unremarkable. No common bile duct dilatation. Pancreas: No mass, inflammation or ductal dilatation. Spleen: Normal size.  No focal lesions. Adrenals/Urinary Tract: Adrenal glands are unremarkable. Status post left nephrectomy. The right kidney is unremarkable. The bladder is normal. Stomach/Bowel: The stomach, duodenum, small bowel and colon are unremarkable. Vascular/Lymphatic: Scattered age advanced atherosclerotic calcification involving the aorta and iliac arteries. No aneurysm. No mesenteric or retroperitoneal mass or adenopathy. Reproductive: The prostate gland and seminal vesicles are unremarkable. Other: No pelvic mass or adenopathy. No free pelvic fluid collections. No inguinal mass or adenopathy. No abdominal wall hernia or subcutaneous lesions. 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 No worrisome lytic or sclerotic bone lesions. Stable left hip arthroplasty. IMPRESSION: 1. Persistent multifocal airspace opacities with air bronchograms most likely infectious or inflammatory process. No significant interval change when compared to the prior recent CT scan from 1 month ago. 2. Stable 12 mm left paratracheal lymph node. 3. Stable appearing lytic and sclerotic changes involving the left seventh rib. No new rib lesions. 4. No findings for abdominal/pelvic metastatic disease. 5. Status post left nephrectomy. 6. Age advanced atherosclerotic calcification involving the thoracic and abdominal aorta and iliac arteries. 7. Stable 2.4 cm isthmic thyroid lesion. This has been evaluated on previous imaging. Electronically Signed   By: Rudie Meyer M.D.   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 He is status post left hip surgery completed in March 2023 with left hemiarthroplasty followed by palliative radiotherapy to the left ribs completed in February 2023. The patient is also status post treatment with immunotherapy with ipilimumab 1 Mg/KG and nivolumab 3 mg/KG started May 18, 2021 for 4 cycles completed on July 20, 2021. He received radiotherapy to the left hip completed in May 2023. He is also status post left radical nephrectomy on Aug 02, 2021 and the final pathology showed predominantly extensive necrosis with 10% of the tumor cells viable indicating T1b. Status post maintenance treatment with immunotherapy with nivolumab 480 Mg IV started Aug 24, 2021 and discontinued in August 2023 secondary to suspicious immunotherapy mediated pneumonitis.  Followed by prednisone taper for the treatment of the pneumonitis. The patient has been in observation and feeling fine with no concerning complaints except for some aching pains. He had repeat CT scan of the chest, abdomen and pelvis performed recently.  I personally and independently reviewed the scan and discussed the result with the patient and his wife.  His scan showed no concerning finding for disease progression but the patient and his wife are more interested in advanced imaging studies with the PET scan in the future.  I tried to explain to them that I would definitely do a PET scan if there is any concerning finding on the CT images but they are convinced from some of the social kidney group online to have a PET scan. I will arrange for the patient to have a PET scan in 3 months if approved by his insurance. He will continue on observation for now. The patient was advised to call immediately if he has any other concerning symptoms in the interval. The patient voices understanding  of current disease status and treatment  options and is in agreement with the current care plan.  All questions were answered. The patient knows to call the clinic with any problems, questions or concerns. We can certainly see the patient much sooner if necessary.  The total time spent in the appointment was 35 minutes.  Disclaimer: This note was dictated with voice recognition software. Similar sounding words can inadvertently be transcribed and may not be corrected upon review.

## 2022-08-16 ENCOUNTER — Other Ambulatory Visit: Payer: Self-pay

## 2022-08-26 ENCOUNTER — Encounter: Payer: Self-pay | Admitting: Family Medicine

## 2022-08-27 LAB — ACID FAST CULTURE WITH REFLEXED SENSITIVITIES (MYCOBACTERIA): Acid Fast Culture: NEGATIVE

## 2022-09-04 ENCOUNTER — Encounter: Payer: Self-pay | Admitting: Internal Medicine

## 2022-09-07 ENCOUNTER — Encounter: Payer: Self-pay | Admitting: Family Medicine

## 2022-09-07 ENCOUNTER — Ambulatory Visit (INDEPENDENT_AMBULATORY_CARE_PROVIDER_SITE_OTHER): Payer: 59 | Admitting: Family Medicine

## 2022-09-07 ENCOUNTER — Ambulatory Visit: Payer: 59 | Attending: Family Medicine

## 2022-09-07 VITALS — BP 126/79 | HR 72 | Ht 65.0 in | Wt 211.0 lb

## 2022-09-07 DIAGNOSIS — R002 Palpitations: Secondary | ICD-10-CM | POA: Diagnosis not present

## 2022-09-07 DIAGNOSIS — R079 Chest pain, unspecified: Secondary | ICD-10-CM

## 2022-09-07 NOTE — Progress Notes (Signed)
Gordon Mcclain - 47 y.o. male MRN [id number]  Date of birth: 1975-10-16  Subjective Chief Complaint  Patient presents with   Palpitations    HPI Gordon Mcclain is a 47 y.o. male here today with concern of palpitations.  He has had intermittent episodes of palpitations, notices mostly when laying down.  Doesn't really bother him when up and moving around.  He does have some occasional chest tightness, lasts for a couple of minutes.  Does have some pain in the right armpit at times.  No radiation to the left side or into the jaw.  He doesn't consume a lot of caffeine.  Recent labs without electrolyte abnormality, no anemia.  TSH in March was normal.   ROS:  A comprehensive ROS was completed and negative except as noted per HPI  Allergies  Allergen Reactions   Opdivo [Nivolumab] Other (See Comments)    Acute lower back pain - see progress note from 07/20/2021 and 09/29/21 Patient able to complete infusion with additional fluids run concurrently.     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   Renal mass 08/02/2021   Renal mass, left 04/20/2021   Tobacco dependence     Past Surgical History:  Procedure Laterality Date   BRONCHIAL BIOPSY  01/25/2022   Procedure: BRONCHIAL BIOPSIES;  Surgeon: Martina Sinner, MD;  Location: [facility] ENDOSCOPY;  Service: Pulmonary;;   BRONCHIAL BIOPSY  07/10/2022   Procedure: BRONCHIAL BIOPSIES;  Surgeon: Leslye Peer, MD;  Location: [facility] ENDOSCOPY;  Service: Pulmonary;;   BRONCHIAL BRUSHINGS  07/10/2022   Procedure: BRONCHIAL BRUSHINGS;  Surgeon: Leslye Peer, MD;  Location: [facility] ENDOSCOPY;  Service: Pulmonary;;   BRONCHIAL NEEDLE ASPIRATION BIOPSY  07/10/2022   Procedure: BRONCHIAL NEEDLE ASPIRATION BIOPSIES;  Surgeon: Leslye Peer,  MD;  Location: [facility] ENDOSCOPY;  Service: Pulmonary;;   BRONCHIAL WASHINGS  01/25/2022   Procedure: BRONCHIAL WASHINGS;  Surgeon: Martina Sinner, MD;  Location: [facility] ENDOSCOPY;  Service: Pulmonary;;   BRONCHIAL WASHINGS  07/10/2022   Procedure: BRONCHIAL WASHINGS;  Surgeon: Leslye Peer, MD;  Location: [facility] ENDOSCOPY;  Service: Pulmonary;;   PARTIAL HIP ARTHROPLASTY Left    PILONIDAL CYST EXCISION  approx 2002   ROBOT ASSISTED LAPAROSCOPIC NEPHRECTOMY Left 08/02/2021   Procedure: XI ROBOTIC ASSISTED LAPAROSCOPIC RADICAL NEPHRECTOMY;  Surgeon: Rene Paci, MD;  Location: [facility] ORS;  Service: Urology;  Laterality: Left;   VIDEO BRONCHOSCOPY N/A 01/25/2022   Procedure: VIDEO BRONCHOSCOPY WITH FLUORO;  Surgeon: Martina Sinner, MD;  Location: [facility] ENDOSCOPY;  Service: Pulmonary;  Laterality: N/A;   VIDEO BRONCHOSCOPY WITH RADIAL ENDOBRONCHIAL ULTRASOUND  07/10/2022   Procedure: VIDEO BRONCHOSCOPY WITH RADIAL ENDOBRONCHIAL ULTRASOUND;  Surgeon: Leslye Peer, MD;  Location: [facility] ENDOSCOPY;  Service: Pulmonary;;   WISDOM TOOTH EXTRACTION     zio patch     04/2021 zio reassuring.    Social History   Socioeconomic History   Marital status: Married    Spouse name: Not on file   Number of children: Not on file   Years of education: Not on file   Highest education level: Not on file  Occupational History   Not on file  Tobacco Use   Smoking status: Former  Packs/day: 1.00    Years: 20.00    Additional pack years: 0.00    Total pack years: 20.00    Types: Cigarettes    Quit date: 04/27/2021    Years since quitting: 1.3    Passive exposure: Never   Smokeless tobacco: Never  Vaping Use   Vaping Use: Never used  Substance and Sexual Activity   Alcohol use: No   Drug use: No   Sexual activity: Yes    Partners: Female  Other Topics Concern   Not on file  Social History Narrative   Married, 1 daughter and one son.   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    Orig from [place].   Education: HS.   Tob 20  pck-yr hx (current as of 03/2014).   Alc: none.  No hx of drug use.   Caffeine: 3-4 cups coffee/day, 1 cup tea per day.   No exercise.   Social Determinants of Health   Financial Resource Strain: Not on file  Food Insecurity: No Food Insecurity (12/27/2021)   Hunger Vital Sign    Worried About Running Out of Food in the Last Year: Never true    Ran Out of Food in the Last Year: Never true  Transportation Needs: No Transportation Needs (12/27/2021)   PRAPARE - Administrator, Civil Service (Medical): No    Lack of Transportation (Non-Medical): No  Physical Activity: Not on file  Stress: Not on file  Social Connections: Not on file    Family History  Problem Relation Age of Onset   Arthritis Mother    Cancer Maternal Aunt    Heart attack Maternal Uncle    Heart attack Maternal Grandmother    Heart attack Maternal Grandfather    Heart attack Maternal Aunt     Health Maintenance  Topic Date Due   Hepatitis C Screening  10/04/2022 (Originally 08/04/1992)   Colonoscopy  09/07/2023 (Originally 08/05/2019)   INFLUENZA VACCINE  10/26/2022   DTaP/Tdap/Td (2 - Td or Tdap) 06/20/2029   HIV Screening  Completed   HPV VACCINES  Aged Out   COVID-19 Vaccine  Discontinued     ----------------------------------------------------------------------------------------------------------------------------------------------------------------------------------------------------------------- Physical Exam BP 126/79 (BP Location: Left Arm, Patient Position: Sitting, Cuff Size: Large)   Pulse 72   Ht 5\' 5"  (1.651 m)   Wt 211 lb (95.7 kg)   SpO2 97%   BMI 35.11 kg/m   Physical Exam Constitutional:      Appearance: Normal appearance.  HENT:     Head: Normocephalic and atraumatic.  Eyes:     General: No scleral icterus. Cardiovascular:     Rate and Rhythm: Normal rate and regular rhythm.  Pulmonary:      Effort: Pulmonary effort is normal.     Breath sounds: Normal breath sounds.  Neurological:     Mental Status: He is alert.    EKG: NSR.  No ST-T wave changes.  Normal PR and Qtc intervals  ------------------------------------------------------------------------------------------------------------------------------------------------------------------------------------------------------------------- Assessment and Plan  Palpitations EKG completed today without any significant changes from previous tracing.  No recent lab abnormalities.  He has had some intermittent episodes of chest pain.  Discussed chest pain precautions and when to seek emergency care.  Zio patch ordered and referral placed to cardiology.   No orders of the defined types were placed in this encounter.   No follow-ups on file.    This visit occurred during the SARS-CoV-2 public health emergency.  Safety protocols were in place, including screening  questions prior to the visit, additional usage of staff PPE, and extensive cleaning of exam room while observing appropriate contact time as indicated for disinfecting solutions.

## 2022-09-07 NOTE — Progress Notes (Unsigned)
Enrolled patient for a 7 day Zio XT monitor to be mailed to patients home   DOD to read 

## 2022-09-07 NOTE — Patient Instructions (Signed)
Nonspecific Chest Pain, Adult Chest pain is an uncomfortable, tight, or painful feeling in the chest. The pain can feel like a crushing, aching, or squeezing pressure. A person can feel a burning or tingling sensation. Chest pain can also be felt in your back, neck, jaw, shoulder, or arm. This pain can be worse when you move, sneeze, or take a deep breath. Chest pain can be caused by a condition that is life-threatening. This must be treated right away. It can also be caused by something that is not life-threatening. If you have chest pain, it can be hard to know the difference, so it is important to get help right away to make sure that you do not have a serious condition. Some life-threatening causes of chest pain include: Heart attack. A tear in the body's main blood vessel (aortic dissection). Inflammation around your heart (pericarditis). A problem in the lungs, such as a blood clot (pulmonary embolism) or a collapsed lung (pneumothorax). Some non life-threatening causes of chest pain include: Heartburn. Anxiety or stress. Damage to the bones, muscles, and cartilage that make up your chest wall. Pneumonia or bronchitis. Shingles infection (varicella-zoster virus). Your chest pain may come and go. It may also be constant. Your health care provider will do tests and other studies to find the cause of your pain. Treatment will depend on the cause of your chest pain. Follow these instructions at home: Medicines Take over-the-counter and prescription medicines only as told by your health care provider. If you were prescribed an antibiotic medicine, take it as told by your health care provider. Do not stop taking the antibiotic even if you start to feel better. Activity Avoid any activities that cause chest pain. Do not lift anything that is heavier than 10 lb (4.5 kg), or the limit that you are told, until your health care provider says that it is safe. Rest as directed by your health care  provider. Return to your normal activities only as told by your health care provider. Ask your health care provider what activities are safe for you. Lifestyle     Do not use any products that contain nicotine or tobacco, such as cigarettes, e-cigarettes, and chewing tobacco. If you need help quitting, ask your health care provider. Do not drink alcohol. Make healthy lifestyle changes as recommended. These may include: Getting regular exercise. Ask your health care provider to suggest some exercises that are safe for you. Eating a heart-healthy diet. This includes plenty of fresh fruits and vegetables, whole grains, low-fat (lean) protein, and low-fat dairy products. A dietitian can help you find healthy eating options. Maintaining a healthy weight. Managing any other health conditions you may have, such as high blood pressure (hypertension) or diabetes. Reducing stress, such as with yoga or relaxation techniques. General instructions Pay attention to any changes in your symptoms. It is up to you to get the results of any tests that were done. Ask your health care provider, or the department that is doing the tests, when your results will be ready. Keep all follow-up visits as told by your health care provider. This is important. You may be asked to go for further testing if your chest pain does not go away. Contact a health care provider if: Your chest pain does not go away. You feel depressed. You have a fever. You notice changes in your symptoms or develop new symptoms. Get help right away if: Your chest pain gets worse. You have a cough that gets worse, or you   cough up blood. You have severe pain in your abdomen. You faint. You have sudden, unexplained chest discomfort. You have sudden, unexplained discomfort in your arms, back, neck, or jaw. You have shortness of breath at any time. You suddenly start to sweat, or your skin gets clammy. You feel nausea or you vomit. You  suddenly feel lightheaded or dizzy. You have severe weakness, or unexplained weakness or fatigue. Your heart begins to beat quickly, or it feels like it is skipping beats. These symptoms may represent a serious problem that is an emergency. Do not wait to see if the symptoms will go away. Get medical help right away. Call your local emergency services (911 in the [place] Do not drive yourself to the hospital. Summary Chest pain can be caused by a condition that is serious and requires urgent treatment. It may also be caused by something that is not life-threatening. Your health care provider may do lab tests and other studies to find the cause of your pain. Follow your health care provider's instructions on taking medicines, making lifestyle changes, and getting emergency treatment if symptoms become worse. Keep all follow-up visits as told by your health care provider. This includes visits for any further testing if your chest pain does not go away. This information is not intended to replace advice given to you by your health care provider. Make sure you discuss any questions you have with your health care provider. Document Revised: 01/26/2022 Document Reviewed: 01/26/2022 Elsevier Patient Education  2024 Elsevier Inc.  

## 2022-09-07 NOTE — Assessment & Plan Note (Addendum)
EKG completed today without any significant changes from previous tracing.  No recent lab abnormalities.  He has had some intermittent episodes of chest pain.  Discussed chest pain precautions and when to seek emergency care.  Zio patch ordered and referral placed to cardiology.

## 2022-09-08 ENCOUNTER — Other Ambulatory Visit: Payer: Self-pay

## 2022-09-11 DIAGNOSIS — R002 Palpitations: Secondary | ICD-10-CM | POA: Diagnosis not present

## 2022-09-12 ENCOUNTER — Encounter: Payer: Self-pay | Admitting: Family Medicine

## 2022-09-20 ENCOUNTER — Encounter: Payer: Self-pay | Admitting: Family Medicine

## 2022-10-05 ENCOUNTER — Encounter: Payer: Self-pay | Admitting: Family Medicine

## 2022-10-20 ENCOUNTER — Other Ambulatory Visit: Payer: Self-pay | Admitting: Physician Assistant

## 2022-10-20 DIAGNOSIS — C649 Malignant neoplasm of unspecified kidney, except renal pelvis: Secondary | ICD-10-CM

## 2022-11-03 ENCOUNTER — Other Ambulatory Visit: Payer: Self-pay | Admitting: Internal Medicine

## 2022-11-07 NOTE — Progress Notes (Signed)
Referring-Cody Jerolyn Center, DO Reason for referral-palpitations  HPI: 47 year old male for evaluation of palpitations at request of Milford Cage, DO.  Monitor February 2023 showed sinus rhythm with occasional PAC and PVC.  Repeat monitor June 2024 showed sinus rhythm with occasional PAC, PVC and a couplet.  TSH March 2024 0.0.941.  Laboratories May 2024 showed potassium 4.1, creatinine 1.11.  Cardiology now asked to evaluate.  Patient states that over the past 6 months he has had occasional palpitations described as a skip.  They are not sustained.  They were worse when he was on prednisone and have improved recently.  He denies dyspnea on exertion, orthopnea, PND, pedal edema, syncope or chest pain.  Cardiology now asked to evaluate.  Current Outpatient Medications  Medication Sig Dispense Refill   acetaminophen (TYLENOL) 500 MG tablet Take 1,000 mg by mouth every 6 (six) hours as needed for moderate pain or headache.     omeprazole (PRILOSEC) 40 MG capsule Take 40 mg by mouth daily.     No current facility-administered medications for this visit.    Allergies  Allergen Reactions   Opdivo [Nivolumab] Other (See Comments)    Acute lower back pain - see progress note from 07/20/2021 and 09/29/21 Patient able to complete infusion with additional fluids run concurrently.      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   Renal mass 08/02/2021   Renal mass, left 04/20/2021   Thyroid nodule    Tobacco dependence     Past Surgical History:  Procedure Laterality Date   BRONCHIAL BIOPSY  01/25/2022   Procedure: BRONCHIAL BIOPSIES;  Surgeon: Martina Sinner, MD;  Location: [facility] ENDOSCOPY;  Service: Pulmonary;;   BRONCHIAL BIOPSY  07/10/2022   Procedure: BRONCHIAL BIOPSIES;   Surgeon: Leslye Peer, MD;  Location: [facility] ENDOSCOPY;  Service: Pulmonary;;   BRONCHIAL BRUSHINGS  07/10/2022   Procedure: BRONCHIAL BRUSHINGS;  Surgeon: Leslye Peer, MD;  Location: [facility] ENDOSCOPY;  Service: Pulmonary;;   BRONCHIAL NEEDLE ASPIRATION BIOPSY  07/10/2022   Procedure: BRONCHIAL NEEDLE ASPIRATION BIOPSIES;  Surgeon: Leslye Peer, MD;  Location: [facility] ENDOSCOPY;  Service: Pulmonary;;   BRONCHIAL WASHINGS  01/25/2022   Procedure: BRONCHIAL WASHINGS;  Surgeon: Martina Sinner, MD;  Location: [facility] ENDOSCOPY;  Service: Pulmonary;;   BRONCHIAL WASHINGS  07/10/2022   Procedure: BRONCHIAL WASHINGS;  Surgeon: Leslye Peer, MD;  Location: [facility] ENDOSCOPY;  Service: Pulmonary;;   PARTIAL HIP ARTHROPLASTY Left    PILONIDAL CYST EXCISION  approx 2002   ROBOT ASSISTED LAPAROSCOPIC NEPHRECTOMY Left 08/02/2021   Procedure: XI ROBOTIC ASSISTED LAPAROSCOPIC RADICAL NEPHRECTOMY;  Surgeon: Rene Paci, MD;  Location: [facility] ORS;  Service: Urology;  Laterality: Left;   VIDEO BRONCHOSCOPY N/A 01/25/2022   Procedure: VIDEO BRONCHOSCOPY WITH FLUORO;  Surgeon: Martina Sinner, MD;  Location: [facility] ENDOSCOPY;  Service: Pulmonary;  Laterality: N/A;   VIDEO BRONCHOSCOPY WITH RADIAL ENDOBRONCHIAL ULTRASOUND  07/10/2022   Procedure: VIDEO BRONCHOSCOPY WITH RADIAL ENDOBRONCHIAL ULTRASOUND;  Surgeon: Leslye Peer, MD;  Location: [facility] ENDOSCOPY;  Service: Pulmonary;;   WISDOM TOOTH EXTRACTION     zio patch     04/2021 zio reassuring.    Social History   Socioeconomic History   Marital status:  Married    Spouse name: Not on file   Number of children: 2   Years of education: Not on file   Highest education level: Not on file  Occupational History   Not on file  Tobacco Use   Smoking status: Former    Current packs/day: 0.00    Average packs/day: 1 pack/day for 20.0 years (20.0 ttl pk-yrs)    Types: Cigarettes    Start date: 04/27/2001    Quit date: 04/27/2021    Years since quitting: 1.5     Passive exposure: Never   Smokeless tobacco: Never  Vaping Use   Vaping status: Never Used  Substance and Sexual Activity   Alcohol use: No   Drug use: No   Sexual activity: Yes    Partners: Female  Other Topics Concern   Not on file  Social History Narrative   Married, 1 daughter and one son.   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   Education: HS.   Tob 20  pck-yr hx (current as of 03/2014).   Alc: none.  No hx of drug use.   Caffeine: 3-4 cups coffee/day, 1 cup tea per day.   No exercise.   Social Determinants of Health   Financial Resource Strain: Not on file  Food Insecurity: No Food Insecurity (12/27/2021)   Hunger Vital Sign    Worried About Running Out of Food in the Last Year: Never true    Ran Out of Food in the Last Year: Never true  Transportation Needs: No Transportation Needs (12/27/2021)   PRAPARE - Administrator, Civil Service (Medical): No    Lack of Transportation (Non-Medical): No  Physical Activity: Not on file  Stress: Not on file  Social Connections: Unknown (08/05/2021)   [facility]   Social Network    Social Network: Not on file  Intimate Partner Violence: Not At Risk (12/27/2021)   Humiliation, Afraid, Rape, and Kick questionnaire    Fear of Current or Ex-Partner: No    Emotionally Abused: No    Physically Abused: No    Sexually Abused: No    Family History  Problem Relation Age of Onset   Arthritis Mother    Heart attack Maternal Grandmother    Heart attack Maternal Grandfather    Cancer Maternal Aunt    Heart attack Maternal Aunt    Heart attack Maternal Uncle     ROS: no fevers or chills, productive cough, hemoptysis, dysphasia, odynophagia, melena, hematochezia, dysuria, hematuria, rash, seizure activity, orthopnea, PND, pedal edema, claudication. Remaining systems are negative.  Physical Exam:   Blood pressure 116/74,  pulse 72, height 5\' 5"  (1.651 m), weight 213 lb 1.9 oz (96.7 kg), SpO2 98%.  General:  Well developed/well nourished in NAD Skin warm/dry Patient not depressed No peripheral clubbing Back-normal HEENT-normal/normal eyelids Neck supple/normal carotid upstroke bilaterally; no bruits; no JVD; no thyromegaly chest - CTA/ normal expansion CV - RRR/normal S1 and S2; no murmurs, rubs or gallops;  PMI nondisplaced Abdomen -NT/ND, no HSM, no mass, + bowel sounds, no bruit 2+ femoral pulses, no bruits Ext-no edema, chords, 2+ DP Neuro-grossly nonfocal  ECG -normal sinus rhythm, precordial lead reversal versus cannot rule out prior anterior infarct.  Personally reviewed  A/P  1 palpitations-monitor showed PACs and PVCs.  His symptoms have improved off of prednisone.  We can consider addition of beta-blockade in the future if they worsen.  Will arrange  echocardiogram to assess LV function.  2 coronary calcification-noted on previous CT scan.  Add Crestor 20 mg daily.  Check lipids and liver in 8 weeks.  Will also arrange formal calcium score.  He denies chest pain.  3 metastatic renal cell carcinoma-Per oncology.  Olga Millers, MD

## 2022-11-08 ENCOUNTER — Other Ambulatory Visit: Payer: Self-pay | Admitting: Surgery

## 2022-11-08 DIAGNOSIS — E042 Nontoxic multinodular goiter: Secondary | ICD-10-CM

## 2022-11-10 ENCOUNTER — Other Ambulatory Visit: Payer: Self-pay

## 2022-11-10 ENCOUNTER — Inpatient Hospital Stay: Payer: 59 | Attending: Physician Assistant

## 2022-11-10 ENCOUNTER — Encounter (HOSPITAL_COMMUNITY)
Admission: RE | Admit: 2022-11-10 | Discharge: 2022-11-10 | Disposition: A | Discharge status: Home / Self Care | Payer: 59 | Source: Ambulatory Visit | Attending: Internal Medicine | Admitting: Internal Medicine

## 2022-11-10 DIAGNOSIS — C642 Malignant neoplasm of left kidney, except renal pelvis: Secondary | ICD-10-CM | POA: Insufficient documentation

## 2022-11-10 DIAGNOSIS — C7951 Secondary malignant neoplasm of bone: Secondary | ICD-10-CM | POA: Diagnosis not present

## 2022-11-10 DIAGNOSIS — C649 Malignant neoplasm of unspecified kidney, except renal pelvis: Secondary | ICD-10-CM | POA: Diagnosis present

## 2022-11-10 LAB — CBC WITH DIFFERENTIAL (CANCER CENTER ONLY)
Abs Immature Granulocytes: 0.02 10*3/uL (ref 0.00–0.07)
Basophils Absolute: 0.1 10*3/uL (ref 0.0–0.1)
Basophils Relative: 1 %
Eosinophils Absolute: 0.2 10*3/uL (ref 0.0–0.5)
Eosinophils Relative: 4 %
HCT: 42.2 % (ref 39.0–52.0)
Hemoglobin: 14.1 g/dL (ref 13.0–17.0)
Immature Granulocytes: 0 %
Lymphocytes Relative: 30 %
Lymphs Abs: 1.4 10*3/uL (ref 0.7–4.0)
MCH: 30.9 pg (ref 26.0–34.0)
MCHC: 33.4 g/dL (ref 30.0–36.0)
MCV: 92.5 fL (ref 80.0–100.0)
Monocytes Absolute: 0.4 10*3/uL (ref 0.1–1.0)
Monocytes Relative: 9 %
Neutro Abs: 2.6 10*3/uL (ref 1.7–7.7)
Neutrophils Relative %: 56 %
Platelet Count: 232 10*3/uL (ref 150–400)
RBC: 4.56 MIL/uL (ref 4.22–5.81)
RDW: 12.1 % (ref 11.5–15.5)
WBC Count: 4.7 10*3/uL (ref 4.0–10.5)
nRBC: 0 % (ref 0.0–0.2)

## 2022-11-10 LAB — CMP (CANCER CENTER ONLY)
ALT: 23 U/L (ref 0–44)
AST: 24 U/L (ref 15–41)
Albumin: 3.9 g/dL (ref 3.5–5.0)
Alkaline Phosphatase: 69 U/L (ref 38–126)
Anion gap: 4 — ABNORMAL LOW (ref 5–15)
BUN: 16 mg/dL (ref 6–20)
CO2: 27 mmol/L (ref 22–32)
Calcium: 8.7 mg/dL — ABNORMAL LOW (ref 8.9–10.3)
Chloride: 107 mmol/L (ref 98–111)
Creatinine: 1.04 mg/dL (ref 0.61–1.24)
GFR, Estimated: >60 mL/min (ref >60)
Glucose, Bld: 88 mg/dL (ref 70–99)
Potassium: 4.1 mmol/L (ref 3.5–5.1)
Sodium: 138 mmol/L (ref 135–145)
Total Bilirubin: 0.5 mg/dL (ref 0.3–1.2)
Total Protein: 6.7 g/dL (ref 6.5–8.1)

## 2022-11-10 LAB — GLUCOSE, CAPILLARY: Glucose-Capillary: 87 mg/dL (ref 70–99)

## 2022-11-10 LAB — TSH: TSH: 0.941 u[IU]/mL (ref 0.350–4.500)

## 2022-11-10 MED ORDER — FLUDEOXYGLUCOSE F - 18 (FDG) INJECTION
10.5600 | Freq: Once | INTRAVENOUS | Status: AC
  Administered 2022-11-10: 10.56 via INTRAVENOUS

## 2022-11-14 ENCOUNTER — Inpatient Hospital Stay (HOSPITAL_BASED_OUTPATIENT_CLINIC_OR_DEPARTMENT_OTHER): Payer: 59 | Admitting: Internal Medicine

## 2022-11-14 VITALS — BP 111/76 | HR 70 | Temp 98.2°F | Resp 17 | Ht 65.0 in | Wt 210.0 lb

## 2022-11-14 DIAGNOSIS — C642 Malignant neoplasm of left kidney, except renal pelvis: Secondary | ICD-10-CM | POA: Diagnosis present

## 2022-11-14 DIAGNOSIS — C7951 Secondary malignant neoplasm of bone: Secondary | ICD-10-CM

## 2022-11-14 DIAGNOSIS — C649 Malignant neoplasm of unspecified kidney, except renal pelvis: Secondary | ICD-10-CM

## 2022-11-14 NOTE — Progress Notes (Signed)
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  PRIOR THERAPY: Status post left hip surgery completed in March 2023 with left hemiarthroplasty. Status post palliative radiotherapy to the left ribs completed in February 2023. Status post treatment with immunotherapy with ipilimumab 1 Mg/KG and nivolumab 3 mg/KG started May 18, 2021 for 4 cycles completed on July 20, 2021. Status post radiotherapy to the left hip completed in May 2023. Status post left radical nephrectomy on Aug 02, 2021 and the final pathology showed predominantly extensive necrosis with 10% of the tumor cells viable indicating T1b. Status post maintenance treatment with immunotherapy with nivolumab 480 Mg IV started Aug 24, 2021 and discontinued in August 2023 secondary to suspicious immunotherapy mediated pneumonitis.  Followed by prednisone taper for the treatment of the pneumonitis.  CURRENT THERAPY: Observation  INTERVAL HISTORY: Gordon Mcclain 47 y.o. male returns to the clinic today for follow-up visit accompanied by his wife.  The patient is feeling fine today with no concerning complaints except for occasional pain on the left side of the chest.  He denied having any current shortness of breath except with exertion with no cough or hemoptysis.  He has no nausea, vomiting, diarrhea or constipation.  He has no headache or visual changes.  He has no recent weight loss or night sweats.  He had a PET scan performed recently and he is here for evaluation and discussion of his PET scan results.   MEDICAL HISTORY: Past Medical History:  Diagnosis Date   Frequent headaches    occipital region mostly: takes ibuprofen 800 mg bid most days   Palpitations    Zio patch  reassuring 04/2021   Pneumonia    Renal cancer (HCC) 03/2021   Left->presented with bone met to L rib.   Renal mass 08/02/2021   Renal mass, left 04/20/2021   Tobacco dependence     ALLERGIES:  is allergic to opdivo [nivolumab].  MEDICATIONS:  Current Outpatient Medications  Medication Sig Dispense Refill   acetaminophen (TYLENOL) 500 MG tablet Take 1,000 mg by mouth every 6 (six) hours as needed for moderate pain or headache.     omeprazole (PRILOSEC) 40 MG capsule Take 40 mg by mouth daily.     No current facility-administered medications for this visit.    SURGICAL HISTORY:  Past Surgical History:  Procedure Laterality Date   BRONCHIAL BIOPSY  01/25/2022   Procedure: BRONCHIAL BIOPSIES;  Surgeon: Martina Sinner, MD;  Location: [facility] ENDOSCOPY;  Service: Pulmonary;;   BRONCHIAL BIOPSY  07/10/2022   Procedure: BRONCHIAL BIOPSIES;  Surgeon: Leslye Peer, MD;  Location: [facility] ENDOSCOPY;  Service: Pulmonary;;   BRONCHIAL BRUSHINGS  07/10/2022   Procedure: BRONCHIAL BRUSHINGS;  Surgeon: Leslye Peer, MD;  Location: [facility] ENDOSCOPY;  Service: Pulmonary;;   BRONCHIAL NEEDLE ASPIRATION BIOPSY  07/10/2022   Procedure: BRONCHIAL NEEDLE ASPIRATION BIOPSIES;  Surgeon: Leslye Peer, MD;  Location: [facility] ENDOSCOPY;  Service: Pulmonary;;   BRONCHIAL WASHINGS  01/25/2022   Procedure: BRONCHIAL WASHINGS;  Surgeon: Martina Sinner, MD;  Location: [facility] ENDOSCOPY;  Service: Pulmonary;;   BRONCHIAL WASHINGS  07/10/2022   Procedure: BRONCHIAL WASHINGS;  Surgeon: Leslye Peer, MD;  Location: [facility] ENDOSCOPY;  Service: Pulmonary;;   PARTIAL HIP ARTHROPLASTY Left  PILONIDAL CYST EXCISION  approx 2002   ROBOT ASSISTED LAPAROSCOPIC NEPHRECTOMY Left 08/02/2021   Procedure: XI ROBOTIC ASSISTED LAPAROSCOPIC RADICAL NEPHRECTOMY;  Surgeon: Rene Paci, MD;  Location: [facility] ORS;  Service: Urology;  Laterality: Left;   VIDEO BRONCHOSCOPY N/A 01/25/2022   Procedure: VIDEO BRONCHOSCOPY WITH FLUORO;   Surgeon: Martina Sinner, MD;  Location: [facility] ENDOSCOPY;  Service: Pulmonary;  Laterality: N/A;   VIDEO BRONCHOSCOPY WITH RADIAL ENDOBRONCHIAL ULTRASOUND  07/10/2022   Procedure: VIDEO BRONCHOSCOPY WITH RADIAL ENDOBRONCHIAL ULTRASOUND;  Surgeon: Leslye Peer, MD;  Location: [facility] ENDOSCOPY;  Service: Pulmonary;;   WISDOM TOOTH EXTRACTION     zio patch     04/2021 zio reassuring.    REVIEW OF SYSTEMS:  Constitutional: negative Eyes: negative Ears, nose, mouth, throat, and face: negative Respiratory: positive for pleurisy/chest pain Cardiovascular: negative Gastrointestinal: negative Genitourinary:negative Integument/breast: negative Hematologic/lymphatic: negative Musculoskeletal:positive for arthralgias Neurological: negative Behavioral/Psych: negative Endocrine: negative Allergic/Immunologic: negative   PHYSICAL EXAMINATION: General appearance: alert, cooperative, fatigued, and no distress Head: Normocephalic, without obvious abnormality, atraumatic Neck: no adenopathy, no JVD, supple, symmetrical, trachea midline, and thyroid not enlarged, symmetric, no tenderness/mass/nodules Lymph nodes: Cervical, supraclavicular, and axillary nodes normal. Resp: clear to auscultation bilaterally Back: symmetric, no curvature. ROM normal. No CVA tenderness. Cardio: regular rate and rhythm, S1, S2 normal, no murmur, click, rub or gallop GI: soft, non-tender; bowel sounds normal; no masses,  no organomegaly Extremities: extremities normal, atraumatic, no cyanosis or edema Neurologic: Alert and oriented X 3, normal strength and tone. Normal symmetric reflexes. Normal coordination and gait  ECOG PERFORMANCE STATUS: 1 - Symptomatic but completely ambulatory  Blood pressure 111/76, pulse 70, temperature 98.2 F (36.8 C), temperature source Oral, resp. rate 17, height 5\' 5"  (1.651 m), weight 210 lb (95.3 kg), SpO2 99%.  LABORATORY DATA: Lab Results  Component Value Date   WBC 4.7 11/10/2022    HGB 14.1 11/10/2022   HCT 42.2 11/10/2022   MCV 92.5 11/10/2022   PLT 232 11/10/2022      Chemistry      Component Value Date/Time   NA 138 11/10/2022 0916   K 4.1 11/10/2022 0916   CL 107 11/10/2022 0916   CO2 27 11/10/2022 0916   BUN 16 11/10/2022 0916   CREATININE 1.04 11/10/2022 0916   CREATININE 1.19 04/06/2022 1033      Component Value Date/Time   CALCIUM 8.7 (L) 11/10/2022 0916   ALKPHOS 69 11/10/2022 0916   AST 24 11/10/2022 0916   ALT 23 11/10/2022 0916   BILITOT 0.5 11/10/2022 0916       RADIOGRAPHIC STUDIES: NM PET Image Initial (PI) Skull Base To Thigh  Result Date: 11/14/2022 CLINICAL DATA:  Subsequent treatment strategy for renal cell carcinoma status post nephrectomy. EXAM: NUCLEAR MEDICINE PET SKULL BASE TO THIGH TECHNIQUE: 10.56 mCi F-18 FDG was injected intravenously. Full-ring PET imaging was performed from the skull base to thigh after the radiotracer. CT data was obtained and used for attenuation correction and anatomic localization. Fasting blood glucose: 87 mg/dl COMPARISON:  CT of the chest, abdomen and pelvis 08/10/2022. FINDINGS: Mediastinal blood pool activity: SUV max 2.4 Liver activity: SUV max 3.7 NECK: No hypermetabolic lymph nodes in the neck. Incidental CT findings: None. CHEST: Mass-like area of architectural distortion in the left lower lobe is similar to the prior chest CT from 08/10/2022 and demonstrates no hypermetabolism on today's examination, most compatible with an area of postradiation mass-like fibrosis. Previously noted right sided pulmonary nodules and masses are no longer  solid in appearance, and demonstrate no metabolic activity above blood pool. The largest of these lesions on the prior study in the posterior aspect of the right upper lobe is now a very poorly defined area of ground-glass attenuation estimated to measure approximately 4.6 x 2.9 cm (axial image 24 of series 7) with only low-level metabolic activity (SUVmax = 1.4). The  largest of these in the right lower lobe (axial image 32 of series 7) currently measures 2.5 x 2.4 cm, is purely ground-glass attenuation in appearance, and demonstrates only low-level metabolic activity (SUVmax = 2.0). No new suspicious appearing pulmonary nodules or masses are otherwise noted. Enlarged high left paratracheal lymph node (axial image 53 of series 4) measuring 1.5 cm in short axis demonstrates low-level metabolic activity (SUVmax = 3.2); this is similar to prior examinations. No hypermetabolic hilar lymphadenopathy. Large nodule in the isthmus of the thyroid gland (axial image 54 of series 4) measuring 3.1 x 1.9 cm demonstrating only low-level metabolic activity (SUVmax = 2.7). Incidental CT findings: Aortic atherosclerosis. Atherosclerotic calcifications in the left circumflex coronary artery. ABDOMEN/PELVIS: No abnormal hypermetabolic activity within the liver, pancreas, adrenal glands, or spleen. No hypermetabolic lymph nodes in the abdomen or pelvis. Incidental CT findings: Status post left radical nephrectomy. No unexpected soft tissue mass in the nephrectomy bed. Atherosclerotic calcifications in the abdominal aorta and pelvic vasculature. SKELETON: No focal hypermetabolic activity to suggest skeletal metastasis. Specifically, previously noted lesion in the lateral aspect of the left seventh rib demonstrates no hypermetabolism on today's examination. Incidental CT findings: Status post left hip arthroplasty. IMPRESSION: 1. Today's study demonstrates a positive response to therapy with substantial regression of numerous pulmonary nodules and masses compared to the prior examination. No new lesions are identified in the lungs. There is a high left paratracheal lymph node which is mildly enlarged and demonstrates low-level increased metabolic activity. This node is morphologically similar to several prior examinations, and may be benign, however, close attention on follow-up studies is  recommended. 2. No findings to suggest metastatic disease in the neck, abdomen or pelvis. 3. Stable appearing nodule in the isthmus of the thyroid gland. Correlation with prior fine-needle aspiration from 02/10/2022 is recommended. 4. Aortic atherosclerosis, in addition to left circumflex coronary artery disease. Please note that although the presence of coronary artery calcium documents the presence of coronary artery disease, the severity of this disease and any potential stenosis cannot be assessed on this non-gated CT examination. Assessment for potential risk factor modification, dietary therapy or pharmacologic therapy may be warranted, if clinically indicated. 5. Additional incidental findings, as above. Electronically Signed   By: Trudie Reed M.D.   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 He is status post left hip surgery completed in March 2023 with left hemiarthroplasty followed by palliative radiotherapy to the left ribs completed in February 2023. The patient is also status post treatment with immunotherapy with ipilimumab 1 Mg/KG and nivolumab 3 mg/KG started May 18, 2021 for 4 cycles completed on July 20, 2021. He received radiotherapy to the left hip completed in May 2023. He is also status post left radical nephrectomy on Aug 02, 2021 and the final pathology showed predominantly extensive necrosis with 10% of the tumor cells viable indicating T1b. Status post maintenance treatment with immunotherapy with nivolumab 480 Mg IV started Aug 24, 2021 and discontinued  in August 2023 secondary to suspicious immunotherapy mediated pneumonitis.  Followed by prednisone taper for the treatment of the pneumonitis. The patient has been in observation and feeling fine with no concerning complaints except for some aching pains. The patient  had a PET scan performed recently.  I personally and independently reviewed the PET scan and discussed the result with the patient and his wife. His PET scan showed no concerning findings for disease progression.  He continues to have aortic atherosclerosis and this will need to be monitored by his primary care provider with modification of his diet or treatment for any hypercholesterolemia if present. I will see him back for follow-up visit in around 4 months with repeat CT scan of the chest, abdomen and pelvis.  If the scan showed any concerning findings I may consider him for a PET scan in the future but not as a routine staging workup. The patient was advised to call immediately if he has any other concerning symptoms in the interval. The patient voices understanding of current disease status and treatment options and is in agreement with the current care plan.  All questions were answered. The patient knows to call the clinic with any problems, questions or concerns. We can certainly see the patient much sooner if necessary.   Disclaimer: This note was dictated with voice recognition software. Similar sounding words can inadvertently be transcribed and may not be corrected upon review.

## 2022-11-15 ENCOUNTER — Ambulatory Visit (INDEPENDENT_AMBULATORY_CARE_PROVIDER_SITE_OTHER): Payer: 59 | Admitting: Cardiology

## 2022-11-15 ENCOUNTER — Encounter: Payer: Self-pay | Admitting: Cardiology

## 2022-11-15 VITALS — BP 116/74 | HR 72 | Ht 65.0 in | Wt 213.1 lb

## 2022-11-15 DIAGNOSIS — I2510 Atherosclerotic heart disease of native coronary artery without angina pectoris: Secondary | ICD-10-CM | POA: Diagnosis not present

## 2022-11-15 DIAGNOSIS — I2584 Coronary atherosclerosis due to calcified coronary lesion: Secondary | ICD-10-CM

## 2022-11-15 DIAGNOSIS — R002 Palpitations: Secondary | ICD-10-CM

## 2022-11-15 MED ORDER — ROSUVASTATIN CALCIUM 20 MG PO TABS
20.0000 mg | ORAL_TABLET | Freq: Every day | ORAL | 3 refills | Status: DC
Start: 2022-11-15 — End: 2022-12-01

## 2022-11-15 NOTE — Patient Instructions (Signed)
Medication Instructions:   START ROSUVASTATIN 20 MG ONCE DAILY  *If you need a refill on your cardiac medications before your next appointment, please call your pharmacy*   Lab Work:  Your physician recommends that you return for lab work in: [facility]  If you have labs (blood work) drawn today and your tests are completely normal, you will receive your results only by: MyChart Message (if you have MyChart) OR A paper copy in the mail If you have any lab test that is abnormal or we need to change your treatment, we will call you to review the results.   Testing/Procedures:  Your physician has requested that you have an echocardiogram. Echocardiography is a painless test that uses sound waves to create images of your heart. It provides your doctor with information about the size and shape of your heart and how well your heart's chambers and valves are working. This procedure takes approximately one hour. There are no restrictions for this procedure. Please do NOT wear cologne, perfume, aftershave, or lotions (deodorant is allowed). Please arrive 15 minutes prior to your appointment time. [facility] IN [place]   CORONARY CALCIUM SCORING CT SCAN AT THE [facility]   Follow-Up: At [facility], you and your health needs are our priority.  As part of our continuing mission to provide you with exceptional heart care, we have created designated Provider Care Teams.  These Care Teams include your primary Cardiologist (physician) and Advanced Practice Providers (APPs -  Physician Assistants and Nurse Practitioners) who all work together to provide you with the care you need, when you need it.  We recommend signing up for the patient portal called "MyChart".  Sign up information is provided on this After Visit Summary.  MyChart is used to connect with patients for Virtual Visits (Telemedicine).  Patients are able to view lab/test results, encounter notes, upcoming  appointments, etc.  Non-urgent messages can be sent to your provider as well.   To learn more about what you can do with MyChart, go to [email or web address].    Your next appointment:   12 month(s)  Provider:   Olga Millers, MD

## 2022-11-16 ENCOUNTER — Ambulatory Visit
Admission: RE | Admit: 2022-11-16 | Discharge: 2022-11-16 | Disposition: A | Discharge status: Home / Self Care | Payer: 59 | Source: Ambulatory Visit | Attending: Surgery | Admitting: Surgery

## 2022-11-16 DIAGNOSIS — E042 Nontoxic multinodular goiter: Secondary | ICD-10-CM

## 2022-11-29 DIAGNOSIS — J984 Other disorders of lung: Secondary | ICD-10-CM | POA: Insufficient documentation

## 2022-11-30 ENCOUNTER — Telehealth: Payer: Self-pay | Admitting: Internal Medicine

## 2022-11-30 NOTE — Telephone Encounter (Signed)
Patient's wife called to cancel future appointments and dismiss him from care at [facility]. Patient and spouse are very upset and do not wish to continue care here. Forwarding these concerns to Occupational psychologist.

## 2022-12-01 ENCOUNTER — Other Ambulatory Visit: Payer: Self-pay

## 2022-12-01 ENCOUNTER — Encounter: Payer: Self-pay | Admitting: Cardiology

## 2022-12-01 DIAGNOSIS — I2510 Atherosclerotic heart disease of native coronary artery without angina pectoris: Secondary | ICD-10-CM

## 2022-12-01 DIAGNOSIS — R002 Palpitations: Secondary | ICD-10-CM

## 2022-12-01 MED ORDER — ATORVASTATIN CALCIUM 20 MG PO TABS: 20.0000 mg | ORAL_TABLET | Freq: Every day | ORAL | 3 refills | Status: DC

## 2022-12-11 ENCOUNTER — Other Ambulatory Visit (HOSPITAL_BASED_OUTPATIENT_CLINIC_OR_DEPARTMENT_OTHER): Payer: 59

## 2022-12-12 ENCOUNTER — Ambulatory Visit (INDEPENDENT_AMBULATORY_CARE_PROVIDER_SITE_OTHER): Payer: 59

## 2022-12-12 ENCOUNTER — Ambulatory Visit (HOSPITAL_BASED_OUTPATIENT_CLINIC_OR_DEPARTMENT_OTHER)
Admission: RE | Admit: 2022-12-12 | Discharge: 2022-12-12 | Disposition: A | Discharge status: Home / Self Care | Payer: 59 | Source: Ambulatory Visit | Attending: Cardiology | Admitting: Cardiology

## 2022-12-12 DIAGNOSIS — I2510 Atherosclerotic heart disease of native coronary artery without angina pectoris: Secondary | ICD-10-CM

## 2022-12-12 DIAGNOSIS — R002 Palpitations: Secondary | ICD-10-CM | POA: Diagnosis not present

## 2022-12-12 DIAGNOSIS — I2584 Coronary atherosclerosis due to calcified coronary lesion: Secondary | ICD-10-CM | POA: Insufficient documentation

## 2022-12-12 LAB — ECHOCARDIOGRAM COMPLETE
Area-P 1/2: 4.49 cm2
S' Lateral: 3.46 cm

## 2022-12-25 ENCOUNTER — Encounter: Payer: Self-pay | Admitting: Cardiology

## 2023-01-03 ENCOUNTER — Encounter (HOSPITAL_COMMUNITY): Payer: Self-pay

## 2023-01-03 ENCOUNTER — Ambulatory Visit: Payer: 59 | Admitting: Family Medicine

## 2023-01-03 ENCOUNTER — Encounter: Payer: Self-pay | Admitting: Family Medicine

## 2023-01-03 VITALS — BP 121/79 | HR 58 | Ht 65.0 in | Wt 210.0 lb

## 2023-01-03 DIAGNOSIS — R110 Nausea: Secondary | ICD-10-CM | POA: Diagnosis not present

## 2023-01-03 DIAGNOSIS — R109 Unspecified abdominal pain: Secondary | ICD-10-CM

## 2023-01-04 LAB — CBC WITH DIFFERENTIAL/PLATELET
Basophils Absolute: 0.1 10*3/uL (ref 0.0–0.2)
Basos: 1 %
EOS (ABSOLUTE): 0.2 10*3/uL (ref 0.0–0.4)
Eos: 4 %
Hematocrit: 43.2 % (ref 37.5–51.0)
Hemoglobin: 14.5 g/dL (ref 13.0–17.7)
Immature Grans (Abs): 0 10*3/uL (ref 0.0–0.1)
Immature Granulocytes: 1 %
Lymphocytes Absolute: 2.1 10*3/uL (ref 0.7–3.1)
Lymphs: 35 %
MCH: 30.9 pg (ref 26.6–33.0)
MCHC: 33.6 g/dL (ref 31.5–35.7)
MCV: 92 fL (ref 79–97)
Monocytes Absolute: 0.5 10*3/uL (ref 0.1–0.9)
Monocytes: 8 %
Neutrophils Absolute: 3 10*3/uL (ref 1.4–7.0)
Neutrophils: 51 %
Platelets: 259 10*3/uL (ref 150–450)
RBC: 4.7 x10E6/uL (ref 4.14–5.80)
RDW: 11.3 % — ABNORMAL LOW (ref 11.6–15.4)
WBC: 6 10*3/uL (ref 3.4–10.8)

## 2023-01-04 LAB — LIPASE: Lipase: 22 U/L (ref 13–78)

## 2023-01-04 LAB — CMP14+EGFR
ALT: 27 [IU]/L (ref 0–44)
AST: 32 [IU]/L (ref 0–40)
Albumin: 4.4 g/dL (ref 4.1–5.1)
Alkaline Phosphatase: 96 [IU]/L (ref 44–121)
BUN/Creatinine Ratio: 12 (ref 9–20)
BUN: 13 mg/dL (ref 6–24)
Bilirubin Total: 0.6 mg/dL (ref 0.0–1.2)
CO2: 25 mmol/L (ref 20–29)
Calcium: 9.7 mg/dL (ref 8.7–10.2)
Chloride: 101 mmol/L (ref 96–106)
Creatinine, Ser: 1.13 mg/dL (ref 0.76–1.27)
Globulin, Total: 2.8 g/dL (ref 1.5–4.5)
Glucose: 99 mg/dL (ref 70–99)
Potassium: 4.2 mmol/L (ref 3.5–5.2)
Sodium: 140 mmol/L (ref 134–144)
Total Protein: 7.2 g/dL (ref 6.0–8.5)
eGFR: 80 mL/min/{1.73_m2} (ref >59)

## 2023-01-04 LAB — C-REACTIVE PROTEIN: CRP: 2 mg/L (ref 0–10)

## 2023-01-04 LAB — SEDIMENTATION RATE: Sed Rate: 3 mm/h (ref 0–15)

## 2023-01-07 DIAGNOSIS — R109 Unspecified abdominal pain: Secondary | ICD-10-CM | POA: Insufficient documentation

## 2023-01-07 NOTE — Progress Notes (Signed)
Gordon Mcclain - 47 y.o. male MRN [id number]  Date of birth: 05-Oct-1975  Subjective Chief Complaint  Patient presents with   Headache   Flank Pain    HPI Gordon Mcclain is a 47 y.o. here today with complaint of flank and abdominal pain.  He does have history of metastatic renal cancer.  Having some pain in the flank as well as the epigastric and periumbilical area.  Appetite has been okay.  Some occasional nausea..  Feels like his bowels are moving normally.  Has not had fever or chills.  Denies urinary symptoms including frequency, pain or hematuria.  He was having headache but this is improved at this point.  ROS:  A comprehensive ROS was completed and negative except as noted per HPI  Allergies  Allergen Reactions   Opdivo [Nivolumab] Other (See Comments)    Acute lower back pain - see progress note from 07/20/2021 and 09/29/21 Patient able to complete infusion with additional fluids run concurrently.    Rosuvastatin Rash    Past Medical History:  Diagnosis Date   Frequent headaches    occipital region mostly: takes ibuprofen 800 mg bid most days   Palpitations    Zio patch reassuring 04/2021   Pneumonia    Renal cancer (HCC) 03/2021   Left->presented with bone met to L rib.   Renal mass 08/02/2021   Renal mass, left 04/20/2021   Thyroid nodule    Tobacco dependence     Past Surgical History:  Procedure Laterality Date   BRONCHIAL BIOPSY  01/25/2022   Procedure: BRONCHIAL BIOPSIES;  Surgeon: Martina Sinner, MD;  Location: [facility] ENDOSCOPY;  Service: Pulmonary;;   BRONCHIAL BIOPSY  07/10/2022   Procedure: BRONCHIAL BIOPSIES;  Surgeon: Leslye Peer, MD;  Location: [facility] ENDOSCOPY;  Service: Pulmonary;;   BRONCHIAL BRUSHINGS  07/10/2022   Procedure: BRONCHIAL BRUSHINGS;  Surgeon: Leslye Peer, MD;  Location: [facility] ENDOSCOPY;  Service: Pulmonary;;   BRONCHIAL NEEDLE ASPIRATION BIOPSY  07/10/2022   Procedure: BRONCHIAL NEEDLE ASPIRATION BIOPSIES;  Surgeon: Leslye Peer, MD;  Location: [facility] ENDOSCOPY;  Service: Pulmonary;;   BRONCHIAL WASHINGS  01/25/2022   Procedure: BRONCHIAL WASHINGS;  Surgeon: Martina Sinner, MD;  Location: [facility] ENDOSCOPY;  Service: Pulmonary;;   BRONCHIAL WASHINGS  07/10/2022   Procedure: BRONCHIAL WASHINGS;  Surgeon: Leslye Peer, MD;  Location: [facility] ENDOSCOPY;  Service: Pulmonary;;   PARTIAL HIP ARTHROPLASTY Left    PILONIDAL CYST EXCISION  approx 2002   ROBOT ASSISTED LAPAROSCOPIC NEPHRECTOMY Left 08/02/2021   Procedure: XI ROBOTIC ASSISTED LAPAROSCOPIC RADICAL NEPHRECTOMY;  Surgeon: Rene Paci, MD;  Location: [facility] ORS;  Service: Urology;  Laterality: Left;   VIDEO BRONCHOSCOPY N/A 01/25/2022   Procedure: VIDEO BRONCHOSCOPY WITH FLUORO;  Surgeon: Martina Sinner, MD;  Location: [facility] ENDOSCOPY;  Service: Pulmonary;  Laterality: N/A;   VIDEO BRONCHOSCOPY WITH RADIAL ENDOBRONCHIAL ULTRASOUND  07/10/2022   Procedure: VIDEO BRONCHOSCOPY WITH RADIAL ENDOBRONCHIAL ULTRASOUND;  Surgeon: Leslye Peer, MD;  Location: [facility] ENDOSCOPY;  Service: Pulmonary;;   WISDOM TOOTH EXTRACTION     zio patch     04/2021 zio reassuring.    Social History   Socioeconomic History   Marital status: Married    Spouse name: Not on file   Number of children: 2   Years of education: Not on file   Highest education level: Not on file  Occupational History   Not on file  Tobacco Use   Smoking status: Former  Current packs/day: 0.00    Average packs/day: 1 pack/day for 20.0 years (20.0 ttl pk-yrs)    Types: Cigarettes    Start date: 04/27/2001    Quit date: 04/27/2021    Years since quitting: 1.6    Passive exposure: Never   Smokeless tobacco: Never  Vaping Use   Vaping status: Never Used  Substance and Sexual Activity   Alcohol use: No   Drug use: No   Sexual activity: Yes    Partners: Female  Other Topics Concern   Not on file  Social History Narrative   Married, 1 daughter and one son.   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   Orig from [place].   Education: HS.   Tob 20  pck-yr hx (current as of 03/2014).   Alc: none.  No hx of drug use.   Caffeine: 3-4 cups coffee/day, 1 cup tea per day.   No exercise.   Social Determinants of Health   Financial Resource Strain: Not on file  Food Insecurity: No Food Insecurity (12/27/2021)   Hunger Vital Sign    Worried About Running Out of Food in the Last Year: Never true    Ran Out of Food in the Last Year: Never true  Transportation Needs: No Transportation Needs (12/27/2021)   PRAPARE - Administrator, Civil Service (Medical): No    Lack of Transportation (Non-Medical): No  Physical Activity: Not on file  Stress: Not on file  Social Connections: Unknown (08/05/2021)   [facility]   Social Network    Social Network: Not on file    Family History  Problem Relation Age of Onset   Arthritis Mother    Heart attack Maternal Grandmother    Heart attack Maternal Grandfather    Cancer Maternal Aunt    Heart attack Maternal Aunt    Heart attack Maternal Uncle     Health Maintenance  Topic Date Due   INFLUENZA VACCINE  06/25/2023 (Originally 10/26/2022)   Hepatitis C Screening  01/03/2024 (Originally 08/04/1992)   DTaP/Tdap/Td (2 - Td or Tdap) 06/20/2029   Colonoscopy  08/02/2032   HIV Screening  Completed   HPV VACCINES  Aged Out   COVID-19 Vaccine  Discontinued     ----------------------------------------------------------------------------------------------------------------------------------------------------------------------------------------------------------------- Physical Exam BP 121/79 (BP Location: Left Arm, Patient Position: Sitting, Cuff Size: Large)   Pulse (!) 58   Ht 5\' 5"  (1.651 m)   Wt 210 lb (95.3 kg)   SpO2 99%   BMI 34.95 kg/m   Physical Exam Constitutional:      Appearance: He is well-developed.  Eyes:     General: No scleral  icterus. Cardiovascular:     Rate and Rhythm: Normal rate and regular rhythm.  Pulmonary:     Effort: Pulmonary effort is normal.     Breath sounds: Normal breath sounds.  Abdominal:     General: Abdomen is flat. There is no distension.     Palpations: Abdomen is soft.     Tenderness: There is no abdominal tenderness. There is no guarding.  Neurological:     Mental Status: He is alert.  Psychiatric:        Mood and Affect: Mood normal.        Behavior: Behavior normal.     ------------------------------------------------------------------------------------------------------------------------------------------------------------------------------------------------------------------- Assessment and Plan  Flank pain Exam is reassuring however I am checking labs today.  Low threshold for imaging if symptoms or not resolving and/or labs are normal.  No orders of the defined types were placed in this encounter.   No follow-ups on file.    This visit occurred during the SARS-CoV-2 public health emergency.  Safety protocols were in place, including screening questions prior to the visit, additional usage of staff PPE, and extensive cleaning of exam room while observing appropriate contact time as indicated for disinfecting solutions.

## 2023-01-07 NOTE — Assessment & Plan Note (Signed)
Exam is reassuring however I am checking labs today.  Low threshold for imaging if symptoms or not resolving and/or labs are normal.

## 2023-02-15 IMAGING — MR MR HEAD WO/W CM
13 series · 48 of 48 positions shown · IV contrast ([ID] GADAVIST)
Comparison: No pertinent prior exams available for comparison.

CLINICAL DATA: Provided history: Metastatic renal cell carcinoma to
bone. Kidney cancer, recurrence.

EXAM:
MRI HEAD WITHOUT AND WITH CONTRAST
TECHNIQUE: Multiplanar, multiecho pulse sequences of the brain and surrounding
structures were obtained without and with intravenous contrast.
CONTRAST:  7.5mL GADAVIST GADOBUTROL 1 MMOL/ML IV SOLN

[Series 5: DWI · axial · 3.0mm · 1.36mm/px · z∈[-57,+84]mm · 5 of 96 slices shown (1 of 2)]
[im 1/96]
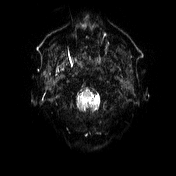
[im 24/96]
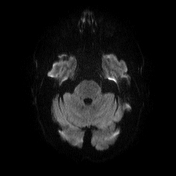
[im 48/96]
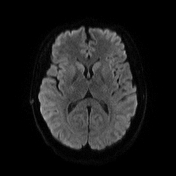
[im 72/96]
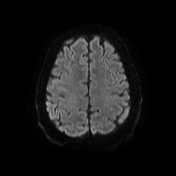
[im 96/96]
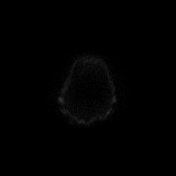

[Series 6: DWI · axial · 3.0mm · 1.36mm/px · z∈[-57,+84]mm · 3 of 48 slices shown (2 of 2)]
[im 1/48]
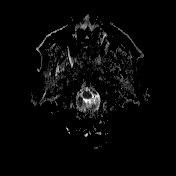
[im 24/48]
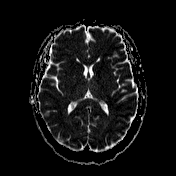
[im 48/48]
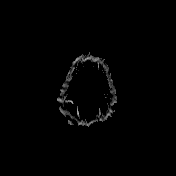

[Series 7: T1 · sagittal · 5.0mm · 0.75mm/px · 1 of 24 slices shown (1 of 2)]
[im 1/24]
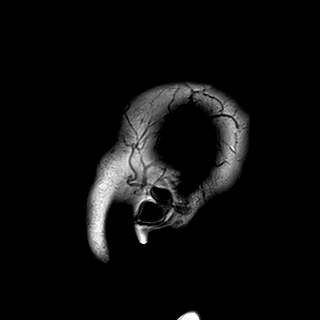

[Series 8: T2 · axial · 5.0mm · 0.62mm/px · z∈[-68,+95]mm · 2 of 26 slices shown]
[im 1/26]
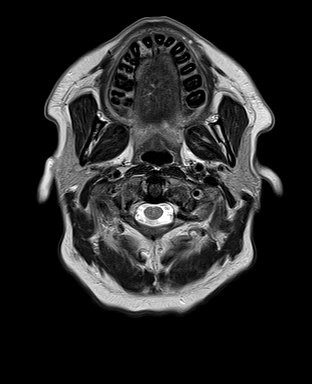
[im 26/26]
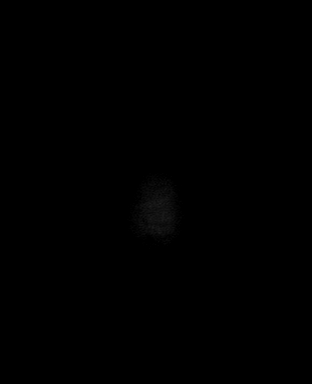

[Series 9: swi_images · axial · 3.0mm · 0.75mm/px · z∈[-75,+102]mm · 4 of 60 slices shown]
[im 1/60]
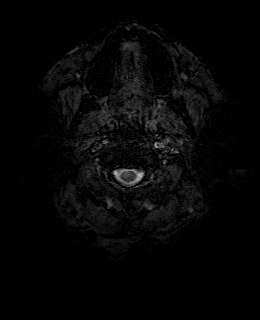
[im 20/60]
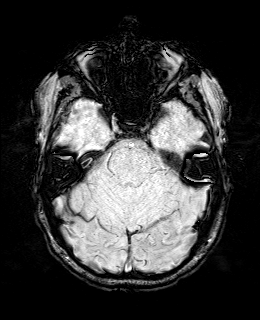
[im 40/60]
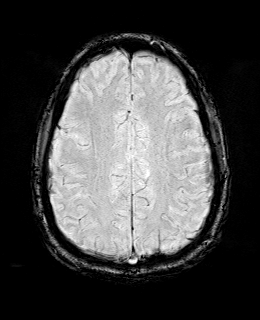
[im 60/60]
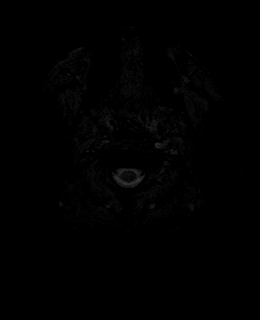

[Series 11: FLAIR · axial · 3.0mm · 0.75mm/px · z∈[-63,+90]mm · 3 of 52 slices shown]
[im 1/52]
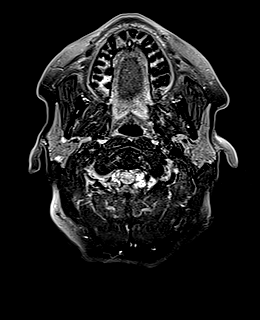
[im 26/52]
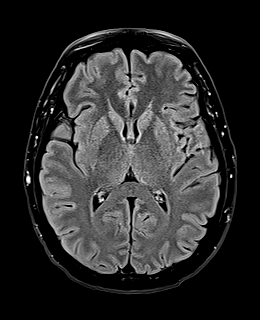
[im 52/52]
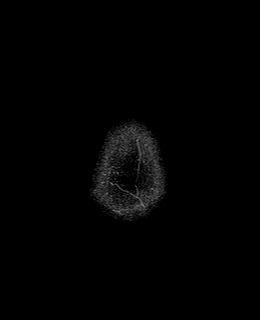

[Series 12: T1 · axial · 1.0mm · 0.94mm/px · z∈[-66,+93]mm · 10 of 160 slices shown (2 of 2)]
[im 1/160]
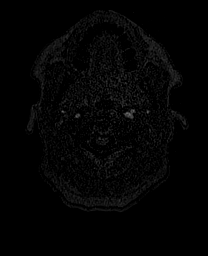
[im 18/160]
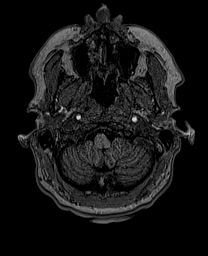
[im 36/160]
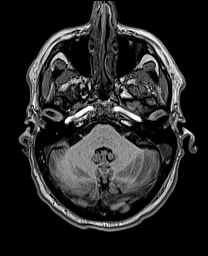
[im 54/160]
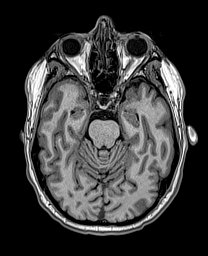
[im 71/160]
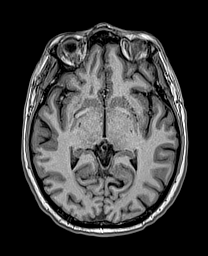
[im 89/160]
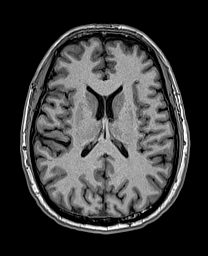
[im 107/160]
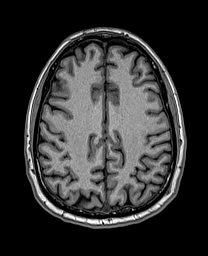
[im 124/160]
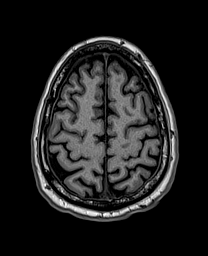
[im 142/160]
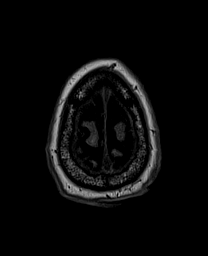
[im 160/160]
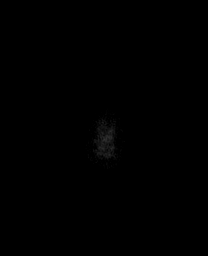

[Series 13: cor dwi_tracew · coronal · 5.0mm · 1.53mm/px · 3 of 54 slices shown]
[im 1/54]
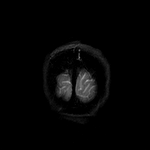
[im 27/54]
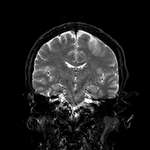
[im 54/54]
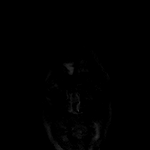

[Series 14: cor dwi_adc · coronal · 5.0mm · 1.53mm/px · 2 of 27 slices shown]
[im 1/27]
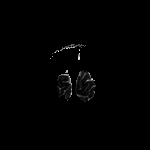
[im 27/27]
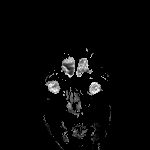

[Series 15: T2 post-contrast · coronal · 5.0mm · 0.57mm/px · 2 of 32 slices shown]
[im 1/32]
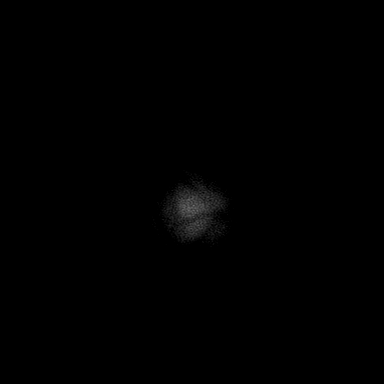
[im 32/32]
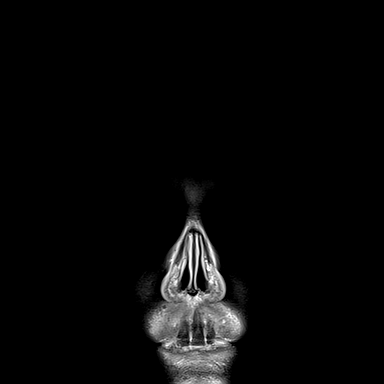

[Series 16: T1 post-contrast · axial · 1.0mm · 0.94mm/px · z∈[-66,+93]mm · 10 of 160 slices shown (1 of 3)]
[im 1/160]
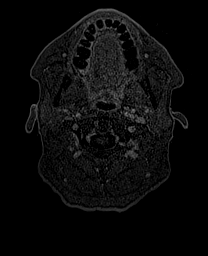
[im 18/160]
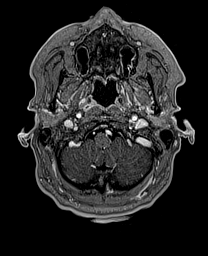
[im 36/160]
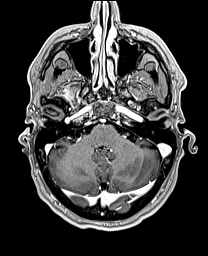
[im 54/160]
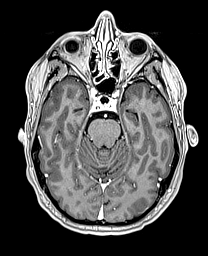
[im 71/160]
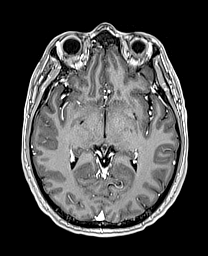
[im 89/160]
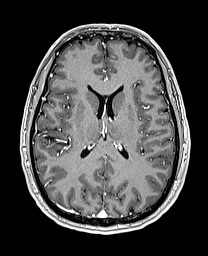
[im 107/160]
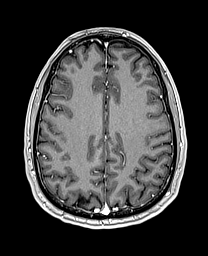
[im 124/160]
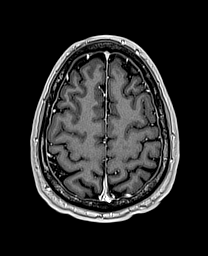
[im 142/160]
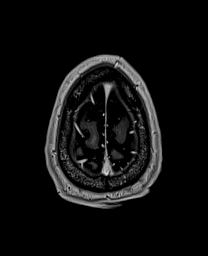
[im 160/160]
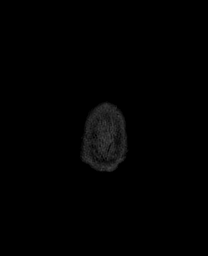

[Series 17: T1 post-contrast · coronal · 5.0mm · 0.43mm/px · 2 of 32 slices shown (2 of 3)]
[im 1/32]
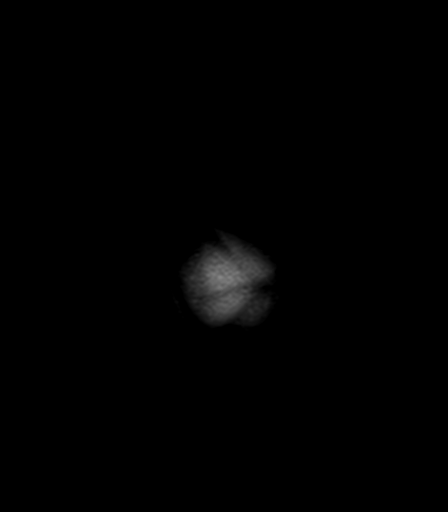
[im 32/32]
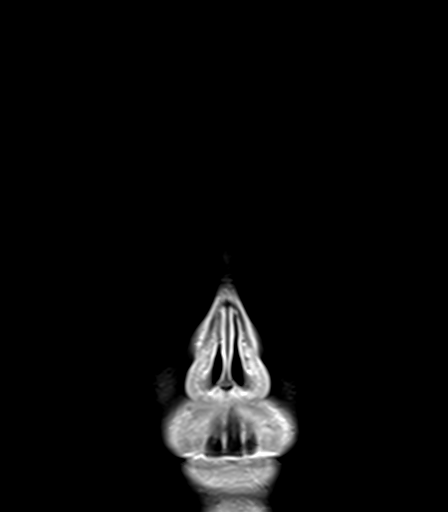

[Series 18: T1 post-contrast · sagittal · 5.0mm · 0.75mm/px · 1 of 24 slices shown (3 of 3)]
[im 1/24]
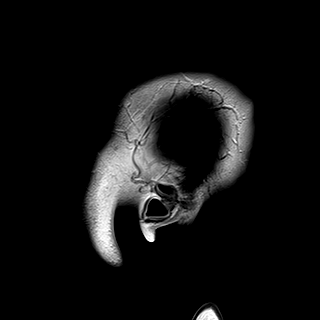

[48 of 48 positions shown; findings below may reference images not displayed]

FINDINGS: Brain:

Cerebral volume is normal for age.

Nonspecific 4 mm focus of T2 FLAIR hyperintense signal abnormality
within the anterior right frontal lobe white matter (series 11,
image 28).

There is no acute infarct.

No evidence of an intracranial mass.

No chronic intracranial blood products.

No extra-axial fluid collection.

No midline shift.

No pathologic intracranial enhancement identified.

Vascular: Maintained flow voids within the proximal large arterial
vessels.

Skull and upper cervical spine: No focal suspicious marrow lesion.

Sinuses/Orbits: No mass or acute finding within the imaged orbits.
Mild mucosal thickening within the bilateral ethmoid, right sphenoid
and bilateral maxillary sinuses.
IMPRESSION: No evidence of intracranial metastatic disease.

Nonspecific 4 mm T2 FLAIR hyperintense remote insult within the
anterior right frontal lobe white matter.

Mild paranasal sinus disease, as described.

## 2023-03-09 DIAGNOSIS — R519 Headache, unspecified: Secondary | ICD-10-CM | POA: Insufficient documentation

## 2023-03-09 DIAGNOSIS — I82411 Acute embolism and thrombosis of right femoral vein: Secondary | ICD-10-CM | POA: Insufficient documentation

## 2023-03-13 ENCOUNTER — Other Ambulatory Visit: Payer: 59

## 2023-03-15 ENCOUNTER — Ambulatory Visit: Payer: 59 | Admitting: Internal Medicine

## 2023-07-02 ENCOUNTER — Encounter: Payer: Self-pay | Admitting: Family Medicine

## 2023-07-03 ENCOUNTER — Ambulatory Visit (INDEPENDENT_AMBULATORY_CARE_PROVIDER_SITE_OTHER): Admitting: Family Medicine

## 2023-07-03 ENCOUNTER — Ambulatory Visit

## 2023-07-03 ENCOUNTER — Encounter: Payer: Self-pay | Admitting: Family Medicine

## 2023-07-03 VITALS — BP 107/72 | HR 65 | Ht 65.0 in | Wt 211.0 lb

## 2023-07-03 DIAGNOSIS — M50322 Other cervical disc degeneration at C5-C6 level: Secondary | ICD-10-CM

## 2023-07-03 DIAGNOSIS — R0781 Pleurodynia: Secondary | ICD-10-CM

## 2023-07-03 DIAGNOSIS — C7951 Secondary malignant neoplasm of bone: Secondary | ICD-10-CM

## 2023-07-03 DIAGNOSIS — C649 Malignant neoplasm of unspecified kidney, except renal pelvis: Secondary | ICD-10-CM

## 2023-07-03 DIAGNOSIS — M5412 Radiculopathy, cervical region: Secondary | ICD-10-CM | POA: Insufficient documentation

## 2023-07-03 DIAGNOSIS — R0789 Other chest pain: Secondary | ICD-10-CM

## 2023-07-03 NOTE — Progress Notes (Signed)
 Gordon Mcclain - 48 y.o. male MRN [id number]  Date of birth: Dec 18, 1975  Subjective Chief Complaint  Patient presents with   Chest Pain   Flank Pain   Atrial Flutter    HPI Gordon Mcclain is a 48 year old male here today with complaint of pain along rib cage.  Does have history of renal cell carcinoma metastatic to bone with known lesion to the left ribs.  Denies difficulty breathing.  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  He also having some episodic numbness and tingling down his arms as well as fullness under his underarms.  He does have some neck pain on the left side.  Weakness in the upper extremities.  ROS:  A comprehensive ROS was completed and negative except as noted per HPI  Allergies  Allergen Reactions   Amoxicillin Diarrhea and Other (See Comments)    Severe vomiting and diarrhea   Opdivo [Nivolumab] Other (See Comments)    Acute lower back pain - see progress note from 07/20/2021 and 09/29/21 Patient able to complete infusion with additional fluids run concurrently.    Rosuvastatin Rash    Past Medical History:  Diagnosis Date   Frequent headaches    occipital region mostly: takes ibuprofen 800 mg bid most days   Palpitations    Zio patch reassuring 04/2021   Pneumonia    Renal cancer (HCC) 03/2021   Left->presented with bone met to L rib.   Renal mass 08/02/2021   Renal mass, left 04/20/2021   Thyroid nodule    Tobacco dependence     Past Surgical History:  Procedure Laterality Date   BRONCHIAL BIOPSY  01/25/2022   Procedure: BRONCHIAL BIOPSIES;  Surgeon: Martina Sinner, MD;  Location: [facility] ENDOSCOPY;  Service: Pulmonary;;   BRONCHIAL BIOPSY  07/10/2022   Procedure: BRONCHIAL BIOPSIES;  Surgeon: Leslye Peer, MD;  Location: [facility] ENDOSCOPY;  Service: Pulmonary;;   BRONCHIAL BRUSHINGS  07/10/2022   Procedure: BRONCHIAL BRUSHINGS;  Surgeon: Leslye Peer, MD;  Location: [facility] ENDOSCOPY;  Service: Pulmonary;;   BRONCHIAL NEEDLE ASPIRATION  BIOPSY  07/10/2022   Procedure: BRONCHIAL NEEDLE ASPIRATION BIOPSIES;  Surgeon: Leslye Peer, MD;  Location: [facility] ENDOSCOPY;  Service: Pulmonary;;   BRONCHIAL WASHINGS  01/25/2022   Procedure: BRONCHIAL WASHINGS;  Surgeon: Martina Sinner, MD;  Location: [facility] ENDOSCOPY;  Service: Pulmonary;;   BRONCHIAL WASHINGS  07/10/2022   Procedure: BRONCHIAL WASHINGS;  Surgeon: Leslye Peer, MD;  Location: [facility] ENDOSCOPY;  Service: Pulmonary;;   PARTIAL HIP ARTHROPLASTY Left    PILONIDAL CYST EXCISION  approx 2002   ROBOT ASSISTED LAPAROSCOPIC NEPHRECTOMY Left 08/02/2021   Procedure: XI ROBOTIC ASSISTED LAPAROSCOPIC RADICAL NEPHRECTOMY;  Surgeon: Rene Paci, MD;  Location: [facility] ORS;  Service: Urology;  Laterality: Left;   VIDEO BRONCHOSCOPY N/A 01/25/2022   Procedure: VIDEO BRONCHOSCOPY WITH FLUORO;  Surgeon: Martina Sinner, MD;  Location: [facility] ENDOSCOPY;  Service: Pulmonary;  Laterality: N/A;   VIDEO BRONCHOSCOPY WITH RADIAL ENDOBRONCHIAL ULTRASOUND  07/10/2022   Procedure: VIDEO BRONCHOSCOPY WITH RADIAL ENDOBRONCHIAL ULTRASOUND;  Surgeon: Leslye Peer, MD;  Location: [facility] ENDOSCOPY;  Service: Pulmonary;;   WISDOM TOOTH EXTRACTION     zio patch     04/2021 zio reassuring.    Social History   Socioeconomic History   Marital status: Married    Spouse name: Not on file   Number of children: 2   Years of education: Not on file   Highest education level: Not on  file  Occupational History   Not on file  Tobacco Use   Smoking status: Former    Current packs/day: 0.00    Average packs/day: 1 pack/day for 20.0 years (20.0 ttl pk-yrs)    Types: Cigarettes    Start date: 04/27/2001    Quit date: 04/27/2021    Years since quitting: 2.1    Passive exposure: Never   Smokeless tobacco: Never  Vaping Use   Vaping status: Never Used  Substance and Sexual Activity   Alcohol use: No   Drug use: No   Sexual activity: Yes    Partners: Female  Other Topics Concern   Not on file  Social History  Narrative   Married, 1 daughter and one son.   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   Orig from [place]   Education: HS.   Tob 20  pck-yr hx (current as of 03/2014).   Alc: none.  No hx of drug use.   Caffeine: 3-4 cups coffee/day, 1 cup tea per day.   No exercise.   Social Drivers of Corporate investment banker Strain: Not on file  Food Insecurity: No Food Insecurity (12/27/2021)   Hunger Vital Sign    Worried About Running Out of Food in the Last Year: Never true    Ran Out of Food in the Last Year: Never true  Transportation Needs: No Transportation Needs (12/27/2021)   PRAPARE - Administrator, Civil Service (Medical): No    Lack of Transportation (Non-Medical): No  Physical Activity: Not on file  Stress: Not on file  Social Connections: Unknown (08/05/2021)   Received from [facility]   Social Network    Social Network: Not on file    Family History  Problem Relation Age of Onset   Arthritis Mother    Heart attack Maternal Grandmother    Heart attack Maternal Grandfather    Cancer Maternal Aunt    Heart attack Maternal Aunt    Heart attack Maternal Uncle     Health Maintenance  Topic Date Due   Pneumococcal Vaccine 68-50 Years old (1 of 2 - PCV) Never done   Hepatitis C Screening  01/03/2024 (Originally 08/04/1992)   INFLUENZA VACCINE  10/26/2023   DTaP/Tdap/Td (2 - Td or Tdap) 06/20/2029   Colonoscopy  08/02/2032   HIV Screening  Completed   HPV VACCINES  Aged Out   COVID-19 Vaccine  Discontinued     ----------------------------------------------------------------------------------------------------------------------------------------------------------------------------------------------------------------- Physical Exam BP 107/72 (BP Location: Left Arm, Patient Position: Sitting, Cuff Size: Large)   Pulse 65   Ht 5\' 5"  (1.651 m)   Wt 211 lb (95.7 kg)   SpO2 97%   BMI 35.11 kg/m    Physical Exam Constitutional:      Appearance: Normal appearance.  HENT:     Head: Normocephalic and atraumatic.  Eyes:     General: No scleral icterus.    Conjunctiva/sclera: Conjunctivae normal.  Cardiovascular:     Rate and Rhythm: Normal rate and regular rhythm.  Pulmonary:     Effort: Pulmonary effort is normal.     Breath sounds: Normal breath sounds.     Comments: Tenderness to palpation along the lateral chest wall bilaterally. Musculoskeletal:     Cervical back: Normal range of motion.  Neurological:     Mental Status: He is alert.  Psychiatric:        Mood and Affect: Mood normal.        Behavior: Behavior  normal.     ------------------------------------------------------------------------------------------------------------------------------------------------------------------------------------------------------------------- Assessment and Plan  Cervical radiculitis X-rays of the cervical spine ordered.  Chest wall pain Chest x-ray with bilateral rib detail ordered.  Checking vitamin D levels as well as it could just be costochondritis.   No orders of the defined types were placed in this encounter.   No follow-ups on file.

## 2023-07-03 NOTE — Assessment & Plan Note (Signed)
 X-rays of the cervical spine ordered.

## 2023-07-03 NOTE — Assessment & Plan Note (Signed)
 Chest x-ray with bilateral rib detail ordered.  Checking vitamin D levels as well as it could just be costochondritis.

## 2023-07-03 NOTE — Telephone Encounter (Signed)
 Spoke with patient wife- scheduled for 2:30pm today with Dr. Ashley Royalty.

## 2023-07-04 LAB — CBC WITH DIFFERENTIAL/PLATELET
Basophils Absolute: 0.1 10*3/uL (ref 0.0–0.2)
Basos: 1 %
EOS (ABSOLUTE): 0.2 10*3/uL (ref 0.0–0.4)
Eos: 3 %
Hematocrit: 41.9 % (ref 37.5–51.0)
Hemoglobin: 14 g/dL (ref 13.0–17.7)
Immature Grans (Abs): 0 10*3/uL (ref 0.0–0.1)
Immature Granulocytes: 1 %
Lymphocytes Absolute: 1.8 10*3/uL (ref 0.7–3.1)
Lymphs: 28 %
MCH: 30.8 pg (ref 26.6–33.0)
MCHC: 33.4 g/dL (ref 31.5–35.7)
MCV: 92 fL (ref 79–97)
Monocytes Absolute: 0.5 10*3/uL (ref 0.1–0.9)
Monocytes: 8 %
Neutrophils Absolute: 3.8 10*3/uL (ref 1.4–7.0)
Neutrophils: 59 %
Platelets: 237 10*3/uL (ref 150–450)
RBC: 4.55 x10E6/uL (ref 4.14–5.80)
RDW: 11.8 % (ref 11.6–15.4)
WBC: 6.4 10*3/uL (ref 3.4–10.8)

## 2023-07-04 LAB — CMP14+EGFR
ALT: 26 IU/L (ref 0–44)
AST: 23 IU/L (ref 0–40)
Albumin: 4.1 g/dL (ref 4.1–5.1)
Alkaline Phosphatase: 85 IU/L (ref 44–121)
BUN/Creatinine Ratio: 14 (ref 9–20)
BUN: 16 mg/dL (ref 6–24)
Bilirubin Total: 0.3 mg/dL (ref 0.0–1.2)
CO2: 25 mmol/L (ref 20–29)
Calcium: 9.3 mg/dL (ref 8.7–10.2)
Chloride: 104 mmol/L (ref 96–106)
Creatinine, Ser: 1.16 mg/dL (ref 0.76–1.27)
Globulin, Total: 2.1 g/dL (ref 1.5–4.5)
Glucose: 106 mg/dL — ABNORMAL HIGH (ref 70–99)
Potassium: 4.2 mmol/L (ref 3.5–5.2)
Sodium: 141 mmol/L (ref 134–144)
Total Protein: 6.2 g/dL (ref 6.0–8.5)
eGFR: 78 mL/min/{1.73_m2} (ref >59)

## 2023-07-04 LAB — VITAMIN D 25 HYDROXY (VIT D DEFICIENCY, FRACTURES): Vit D, 25-Hydroxy: 25.1 ng/mL — ABNORMAL LOW (ref 30.0–100.0)

## 2023-07-06 ENCOUNTER — Encounter: Payer: Self-pay | Admitting: Family Medicine

## 2023-11-02 ENCOUNTER — Ambulatory Visit (INDEPENDENT_AMBULATORY_CARE_PROVIDER_SITE_OTHER): Admitting: Sports Medicine

## 2023-11-02 ENCOUNTER — Ambulatory Visit

## 2023-11-02 DIAGNOSIS — M51369 Other intervertebral disc degeneration, lumbar region without mention of lumbar back pain or lower extremity pain: Secondary | ICD-10-CM | POA: Insufficient documentation

## 2023-11-02 DIAGNOSIS — M5136 Other intervertebral disc degeneration, lumbar region with discogenic back pain only: Secondary | ICD-10-CM

## 2023-11-02 MED ORDER — DEXAMETHASONE 4 MG PO TABS: 4.0000 mg | ORAL_TABLET | Freq: Three times a day (TID) | ORAL | 0 refills | Status: AC

## 2023-11-02 NOTE — Assessment & Plan Note (Signed)
 Very pleasant 48 year old male, he had axial low back pain for couple weeks now, right-sided with radiation around to the right iliac region. Nothing overtly radicular, worse with sitting, flexion, Valsalva, history of renal cell carcinoma, metastatic with a clear PET and bone scan. No bowel or bladder dysfunction, saddle numbness, constitutional symptoms, we will start with Decadron , patient declines prednisone , home physical therapy, x-rays, return to see me in 6 weeks, MR for interventional planning if not better.

## 2023-11-02 NOTE — Progress Notes (Signed)
    Procedures performed today:    None.  Independent interpretation of notes and tests performed by another provider:   None.  Brief History, Exam, Impression, and Recommendations:    DDD (degenerative disc disease), lumbar Very pleasant 48 year old male, he had axial low back pain for couple weeks now, right-sided with radiation around to the right iliac region. Nothing overtly radicular, worse with sitting, flexion, Valsalva, history of renal cell carcinoma, metastatic with a clear PET and bone scan. No bowel or bladder dysfunction, saddle numbness, constitutional symptoms, we will start with Decadron , patient declines prednisone , home physical therapy, x-rays, return to see me in 6 weeks, MR for interventional planning if not better.    ____________________________________________ Debby PARAS. Curtis, M.D., ABFM., CAQSM., AME. Primary Care and Sports Medicine [facility]  Adjunct Professor of [facility] Medicine  [employer]  Restaurant manager, fast food

## 2023-11-04 ENCOUNTER — Ambulatory Visit: Payer: Self-pay | Admitting: Sports Medicine

## 2023-11-27 ENCOUNTER — Encounter: Payer: Self-pay | Admitting: Sports Medicine

## 2023-12-07 ENCOUNTER — Ambulatory Visit
Admission: RE | Admit: 2023-12-07 | Discharge: 2023-12-07 | Disposition: A | Discharge status: Home / Self Care | Source: Ambulatory Visit | Attending: Surgery | Admitting: Surgery

## 2023-12-07 ENCOUNTER — Other Ambulatory Visit: Payer: Self-pay | Admitting: Surgery

## 2023-12-07 DIAGNOSIS — E042 Nontoxic multinodular goiter: Secondary | ICD-10-CM

## 2023-12-14 ENCOUNTER — Ambulatory Visit: Admitting: Sports Medicine

## 2023-12-19 ENCOUNTER — Ambulatory Visit: Payer: Self-pay | Admitting: Surgery

## 2023-12-19 NOTE — Progress Notes (Signed)
 Good news - USN shows nodules are stable with no new or worrisome findings.  Will plan to repeat USN and TSH in one year, followed by exam here in the office.  Burnard - please arrange follow up in one year.  Krystal Spinner, MD [facility] practice Office: [phone]

## 2023-12-21 NOTE — Progress Notes (Signed)
 HPI: FU palpitations.  Monitor February 2023 showed sinus rhythm with occasional PAC and PVC.  Repeat monitor June 2024 showed sinus rhythm with occasional PAC, PVC and a couplet.  Calcium  score September 2024 0; note of vague opacity in the right lung less prominent compared to previous PET scan and attention with follow-up (oncology).  Echocardiogram September 2024 showed normal LV function.  Since last seen the patient denies any dyspnea on exertion, orthopnea, PND, pedal edema, palpitations, syncope or chest pain.   Current Outpatient Medications  Medication Sig Dispense Refill   ELIQUIS 5 MG TABS tablet Take 5 mg by mouth daily at 6 (six) AM.     dexamethasone  (DECADRON ) 4 MG tablet Take 1 tablet (4 mg total) by mouth 3 (three) times daily. (Patient not taking: Reported on 12/27/2023) 15 tablet 0   No current facility-administered medications for this visit.     Past Medical History:  Diagnosis Date   Frequent headaches    occipital region mostly: takes ibuprofen  800 mg bid most days   Palpitations    Zio patch reassuring 04/2021   Pneumonia    Renal cancer (HCC) 03/2021   Left->presented with bone met to L rib.   Renal mass 08/02/2021   Renal mass, left 04/20/2021   Thyroid  nodule    Tobacco dependence     Past Surgical History:  Procedure Laterality Date   BRONCHIAL BIOPSY  01/25/2022   Procedure: BRONCHIAL BIOPSIES;  Surgeon: Kara Dorn NOVAK, MD;  Location: [facility] ENDOSCOPY;  Service: Pulmonary;;   BRONCHIAL BIOPSY  07/10/2022   Procedure: BRONCHIAL BIOPSIES;  Surgeon: Shelah Lamar RAMAN, MD;  Location: [facility] ENDOSCOPY;  Service: Pulmonary;;   BRONCHIAL BRUSHINGS  07/10/2022   Procedure: BRONCHIAL BRUSHINGS;  Surgeon: Shelah Lamar RAMAN, MD;  Location: [facility] ENDOSCOPY;  Service: Pulmonary;;   BRONCHIAL NEEDLE ASPIRATION BIOPSY  07/10/2022   Procedure: BRONCHIAL NEEDLE ASPIRATION BIOPSIES;  Surgeon: Shelah Lamar RAMAN, MD;  Location: [facility] ENDOSCOPY;  Service: Pulmonary;;   BRONCHIAL  WASHINGS  01/25/2022   Procedure: BRONCHIAL WASHINGS;  Surgeon: Kara Dorn NOVAK, MD;  Location: [facility] ENDOSCOPY;  Service: Pulmonary;;   BRONCHIAL WASHINGS  07/10/2022   Procedure: BRONCHIAL WASHINGS;  Surgeon: Shelah Lamar RAMAN, MD;  Location: [facility] ENDOSCOPY;  Service: Pulmonary;;   PARTIAL HIP ARTHROPLASTY Left    PILONIDAL CYST EXCISION  approx 2002   ROBOT ASSISTED LAPAROSCOPIC NEPHRECTOMY Left 08/02/2021   Procedure: XI ROBOTIC ASSISTED LAPAROSCOPIC RADICAL NEPHRECTOMY;  Surgeon: Devere Lonni Righter, MD;  Location: [facility] ORS;  Service: Urology;  Laterality: Left;   VIDEO BRONCHOSCOPY N/A 01/25/2022   Procedure: VIDEO BRONCHOSCOPY WITH FLUORO;  Surgeon: Kara Dorn NOVAK, MD;  Location: [facility] ENDOSCOPY;  Service: Pulmonary;  Laterality: N/A;   VIDEO BRONCHOSCOPY WITH RADIAL ENDOBRONCHIAL ULTRASOUND  07/10/2022   Procedure: VIDEO BRONCHOSCOPY WITH RADIAL ENDOBRONCHIAL ULTRASOUND;  Surgeon: Shelah Lamar RAMAN, MD;  Location: [facility] ENDOSCOPY;  Service: Pulmonary;;   WISDOM TOOTH EXTRACTION     zio patch     04/2021 zio reassuring.    Social History   Socioeconomic History   Marital status: Married    Spouse name: Not on file   Number of children: 2   Years of education: Not on file   Highest education level: Not on file  Occupational History   Not on file  Tobacco Use   Smoking status: Former    Current packs/day: 0.00    Average packs/day: 1 pack/day for 20.0 years (20.0 ttl pk-yrs)    Types: Cigarettes  Start date: 04/27/2001    Quit date: 04/27/2021    Years since quitting: 2.6    Passive exposure: Never   Smokeless tobacco: Never  Vaping Use   Vaping status: Never Used  Substance and Sexual Activity   Alcohol use: No   Drug use: No   Sexual activity: Yes    Partners: Female  Other Topics Concern   Not on file  Social History Narrative   Married, 1 daughter and one son.   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   Orig from [place].    Education: HS.   Tob 20  pck-yr hx (current as of 03/2014).   Alc: none.  No hx of drug use.   Caffeine: 3-4 cups coffee/day, 1 cup tea per day.   No exercise.   Social Drivers of Corporate investment banker Strain: Not on file  Food Insecurity: No Food Insecurity (12/27/2021)   Hunger Vital Sign    Worried About Running Out of Food in the Last Year: Never true    Ran Out of Food in the Last Year: Never true  Transportation Needs: No Transportation Needs (12/27/2021)   PRAPARE - Administrator, Civil Service (Medical): No    Lack of Transportation (Non-Medical): No  Physical Activity: Not on file  Stress: Not on file  Social Connections: Unknown (08/05/2021)   Received from [facility]   Social Network    Social Network: Not on file  Intimate Partner Violence: Not At Risk (12/27/2021)   Humiliation, Afraid, Rape, and Kick questionnaire    Fear of Current or Ex-Partner: No    Emotionally Abused: No    Physically Abused: No    Sexually Abused: No    Family History  Problem Relation Age of Onset   Arthritis Mother    Heart attack Maternal Grandmother    Heart attack Maternal Grandfather    Cancer Maternal Aunt    Heart attack Maternal Aunt    Heart attack Maternal Uncle     ROS: no fevers or chills, productive cough, hemoptysis, dysphasia, odynophagia, melena, hematochezia, dysuria, hematuria, rash, seizure activity, orthopnea, PND, pedal edema, claudication. Remaining systems are negative.  Physical Exam: Well-developed well-nourished in no acute distress.  Skin is warm and dry.  HEENT is normal.  Neck is supple.  Chest is clear to auscultation with normal expansion.  Cardiovascular exam is regular rate and rhythm.  Abdominal exam nontender or distended. No masses palpated. Extremities show no edema. neuro grossly intact  ECG-normal sinus rhythm at a rate of 70, no ST changes.  Personally reviewed  A/P  1 palpitations-previous monitor showed PVCs and  PACs.  Symptoms are reasonably well-controlled.  LV function is normal.  Can add beta-blocker later if necessary.  2 metastatic renal cell carcinoma-followed by oncology.  Redell Shallow, MD

## 2023-12-26 ENCOUNTER — Ambulatory Visit: Payer: Self-pay

## 2023-12-26 NOTE — Telephone Encounter (Signed)
 FYI Only or Action Required?: FYI only for provider.  Patient was last seen in primary care on 11/02/2023 by Curtis Debby PARAS, MD.  Called Nurse Triage reporting Altered Mental Status.  Symptoms began several months ago.  Interventions attempted: Nothing.  Symptoms are: gradually worsening.  Triage Disposition: See PCP Within 2 Weeks  Patient/caregiver understands and will follow disposition?: Yes      Copied from CRM [id number]. Topic: Clinical - Red Word Triage >> Dec 26, 2023  5:00 PM Mercer PEDLAR wrote: Red Word that prompted transfer to Nurse Triage: Joen Sharps, patient's spouse called stating that patient is having issues with memory and confusion. Reason for Disposition  [1] Longstanding confusion (e.g., dementia, stroke) AND [2] NO worsening or change    Patient's wife states symptoms have been ongoing for 3 months.  Answer Assessment - Initial Assessment Questions This RN spoke with the patient's wife regarding symptoms. 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 Patient is forgetting things that are told to him and states symptoms  are mild. Patient oriented to place, time and situation. Wife concerned and was advised to follow up with PCP by the patient's cancer doctor.    1. LEVEL OF CONSCIOUSNESS: How are they (the patient) acting right now? (e.g., alert-oriented, confused, lethargic, stuporous, comatose)     Confusion and memory issues  2. ONSET: When did the confusion start?  (e.g., minutes, hours, days)     3 months  3. PATTERN: Does this come and go, or has it been constant since it started?  Is it present now?     Comes and goes  4. CAUSE: What do you think is causing the confusion?      Diabetes or high cholesterol  5. OTHER SYMPTOMS: Are there any other symptoms? (e.g., difficulty breathing, fever, headache, weakness)     Denies  Protocols used: Confusion - Delirium-A-AH

## 2023-12-26 NOTE — Telephone Encounter (Signed)
 Patient is scheduled 01/01/2024 with Dr. Alvia

## 2023-12-27 ENCOUNTER — Encounter: Payer: Self-pay | Admitting: Cardiology

## 2023-12-27 ENCOUNTER — Ambulatory Visit: Attending: Cardiology | Admitting: Cardiology

## 2023-12-27 VITALS — BP 110/70 | HR 70 | Ht 66.0 in | Wt 219.0 lb

## 2023-12-27 DIAGNOSIS — R002 Palpitations: Secondary | ICD-10-CM

## 2023-12-27 DIAGNOSIS — E7800 Pure hypercholesterolemia, unspecified: Secondary | ICD-10-CM | POA: Diagnosis not present

## 2023-12-27 NOTE — Patient Instructions (Signed)
  Follow-Up: At [facility], you and your health needs are our priority.  As part of our continuing mission to provide you with exceptional heart care, our providers are all part of one team.  This team includes your primary Cardiologist (physician) and Advanced Practice Providers or APPs (Physician Assistants and Nurse Practitioners) who all work together to provide you with the care you need, when you need it.  Your next appointment:   12 month(s)  Provider:   Alexandria Angel MD

## 2024-01-01 ENCOUNTER — Ambulatory Visit: Admitting: Family Medicine

## 2024-01-01 VITALS — BP 122/77 | HR 65 | Ht 66.0 in | Wt 215.0 lb

## 2024-01-01 DIAGNOSIS — R8290 Unspecified abnormal findings in urine: Secondary | ICD-10-CM | POA: Diagnosis not present

## 2024-01-01 DIAGNOSIS — R4189 Other symptoms and signs involving cognitive functions and awareness: Secondary | ICD-10-CM | POA: Diagnosis not present

## 2024-01-02 ENCOUNTER — Ambulatory Visit: Payer: Self-pay | Admitting: Family Medicine

## 2024-01-02 ENCOUNTER — Encounter: Payer: Self-pay | Admitting: Family Medicine

## 2024-01-02 DIAGNOSIS — E785 Hyperlipidemia, unspecified: Secondary | ICD-10-CM

## 2024-01-02 LAB — URINALYSIS, ROUTINE W REFLEX MICROSCOPIC
Bilirubin, UA: NEGATIVE
Glucose, UA: NEGATIVE
Ketones, UA: NEGATIVE
Leukocytes,UA: NEGATIVE
Nitrite, UA: NEGATIVE
Protein,UA: NEGATIVE
RBC, UA: NEGATIVE
Specific Gravity, UA: 1.022 (ref 1.005–1.030)
Urobilinogen, Ur: 0.2 mg/dL (ref 0.2–1.0)
pH, UA: 5.5 (ref 5.0–7.5)

## 2024-01-03 LAB — HEMOGLOBIN A1C
Est. average glucose Bld gHb Est-mCnc: 108 mg/dL
Hgb A1c MFr Bld: 5.4 % (ref 4.8–5.6)

## 2024-01-03 LAB — TSH+FREE T4
Free T4: 1.28 ng/dL (ref 0.82–1.77)
TSH: 0.789 u[IU]/mL (ref 0.450–4.500)

## 2024-01-03 LAB — LIPID PANEL WITH LDL/HDL RATIO
Cholesterol, Total: 225 mg/dL — ABNORMAL HIGH (ref 100–199)
HDL: 43 mg/dL (ref >39)
LDL Chol Calc (NIH): 163 mg/dL — ABNORMAL HIGH (ref 0–99)
LDL/HDL Ratio: 3.8 ratio — ABNORMAL HIGH (ref 0.0–3.6)
Triglycerides: 103 mg/dL (ref 0–149)
VLDL Cholesterol Cal: 19 mg/dL (ref 5–40)

## 2024-01-03 LAB — URINE CULTURE: Organism ID, Bacteria: NO GROWTH

## 2024-01-03 LAB — VITAMIN B12: Vitamin B-12: 489 pg/mL (ref 232–1245)

## 2024-01-06 NOTE — Progress Notes (Unsigned)
 Gordon Mcclain - 48 y.o. male MRN [id number]  Date of birth: 01/21/1976  Subjective Chief Complaint  Patient presents with  . MRI Results    HPI  Allergies  Allergen Reactions  . Amoxicillin Diarrhea and Other (See Comments)    Severe vomiting and diarrhea  . Opdivo  [Nivolumab ] Other (See Comments)    Acute lower back pain - see progress note from 07/20/2021 and 09/29/21 Patient able to complete infusion with additional fluids run concurrently.   . Rosuvastatin  Rash    Past Medical History:  Diagnosis Date  . Frequent headaches    occipital region mostly: takes ibuprofen  800 mg bid most days  . Palpitations    Zio patch reassuring 04/2021  . Pneumonia   . 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  SABRA Renal mass 08/02/2021  . Renal mass, left 04/20/2021  . Thyroid  nodule   . Tobacco dependence     Past Surgical History:  Procedure Laterality Date  . BRONCHIAL BIOPSY  01/25/2022   Procedure: BRONCHIAL BIOPSIES;  Surgeon: Kara Dorn NOVAK, MD;  Location: [facility] ENDOSCOPY;  Service: Pulmonary;;  . BRONCHIAL BIOPSY  07/10/2022   Procedure: BRONCHIAL BIOPSIES;  Surgeon: Shelah Lamar RAMAN, MD;  Location: [facility] ENDOSCOPY;  Service: Pulmonary;;  . BRONCHIAL BRUSHINGS  07/10/2022   Procedure: BRONCHIAL BRUSHINGS;  Surgeon: Shelah Lamar RAMAN, MD;  Location: [facility] ENDOSCOPY;  Service: Pulmonary;;  . BRONCHIAL NEEDLE ASPIRATION BIOPSY  07/10/2022   Procedure: BRONCHIAL NEEDLE ASPIRATION BIOPSIES;  Surgeon: Shelah Lamar RAMAN, MD;  Location: [facility] ENDOSCOPY;  Service: Pulmonary;;  . BRONCHIAL WASHINGS  01/25/2022   Procedure: BRONCHIAL WASHINGS;  Surgeon: Kara Dorn NOVAK, MD;  Location: [facility] ENDOSCOPY;  Service: Pulmonary;;  . BRONCHIAL WASHINGS  07/10/2022   Procedure: BRONCHIAL WASHINGS;  Surgeon: Shelah Lamar RAMAN, MD;  Location: [facility] ENDOSCOPY;  Service: Pulmonary;;  . PARTIAL HIP ARTHROPLASTY Left   . PILONIDAL CYST EXCISION  approx 2002  . ROBOT ASSISTED LAPAROSCOPIC  NEPHRECTOMY Left 08/02/2021   Procedure: XI ROBOTIC ASSISTED LAPAROSCOPIC RADICAL NEPHRECTOMY;  Surgeon: Devere Lonni Righter, MD;  Location: [facility] ORS;  Service: Urology;  Laterality: Left;  SABRA VIDEO BRONCHOSCOPY N/A 01/25/2022   Procedure: VIDEO BRONCHOSCOPY WITH FLUORO;  Surgeon: Kara Dorn NOVAK, MD;  Location: [facility] ENDOSCOPY;  Service: Pulmonary;  Laterality: N/A;  . VIDEO BRONCHOSCOPY WITH RADIAL ENDOBRONCHIAL ULTRASOUND  07/10/2022   Procedure: VIDEO BRONCHOSCOPY WITH RADIAL ENDOBRONCHIAL ULTRASOUND;  Surgeon: Shelah Lamar RAMAN, MD;  Location: [facility] ENDOSCOPY;  Service: Pulmonary;;  . WISDOM TOOTH EXTRACTION    . zio patch     04/2021 zio reassuring.    Social History   Socioeconomic History  . Marital status: Married    Spouse name: Not on file  . Number of children: 2  . Years of education: Not on file  . Highest education level: Not on file  Occupational History  . Not on file  Tobacco Use  . Smoking status: Former    Current packs/day: 0.00    Average packs/day: 1 pack/day for 20.0 years (20.0 ttl pk-yrs)    Types: Cigarettes    Start date: 04/27/2001    Quit date: 04/27/2021    Years since quitting: 2.6    Passive exposure: Never  . Smokeless tobacco: Never  Vaping Use  . Vaping status: Never Used  Substance and Sexual Activity  . Alcohol use: No  . Drug use: No  . Sexual activity: Yes    Partners: Female  Other Topics  Concern  . Not on file  Social History Narrative   Married, 1 daughter and one son.   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   Orig from [place].   Education: HS.   Tob 20  pck-yr hx (current as of 03/2014).   Alc: none.  No hx of drug use.   Caffeine: 3-4 cups coffee/day, 1 cup tea per day.   No exercise.   Social Drivers of Corporate investment banker Strain: Low Risk  (01/01/2024)   Overall Financial Resource Strain (CARDIA)   . Difficulty of Paying Living Expenses: Not very hard  Food Insecurity:  No Food Insecurity (01/01/2024)   Hunger Vital Sign   . Worried About Programme researcher, broadcasting/film/video in the Last Year: Never true   . Ran Out of Food in the Last Year: Never true  Transportation Needs: Unknown (01/01/2024)   PRAPARE - Transportation   . Lack of Transportation (Medical): Not on file   . Lack of Transportation (Non-Medical): No  Physical Activity: Sufficiently Active (01/01/2024)   Exercise Vital Sign   . Days of Exercise per Week: 5 days   . Minutes of Exercise per Session: 60 min  Stress: No Stress Concern Present (01/01/2024)   [employer] of Occupational Health - Occupational Stress Questionnaire   . Feeling of Stress: Only a little  Social Connections: Socially Integrated (01/01/2024)   Social Connection and Isolation Panel   . Frequency of Communication with Friends and Family: Three times a week   . Frequency of Social Gatherings with Friends and Family: Once a week   . Attends Religious Services: 1 to 4 times per year   . Active Member of Clubs or Organizations: No   . Attends Banker Meetings: 1 to 4 times per year   . Marital Status: Married    Family History  Problem Relation Age of Onset  . Arthritis Mother   . Heart attack Maternal Grandmother   . Heart attack Maternal Grandfather   . Cancer Maternal Aunt   . Heart attack Maternal Aunt   . Heart attack Maternal Uncle     Health Maintenance  Topic Date Due  . Hepatitis C Screening  Never done  . Influenza Vaccine  06/24/2024 (Originally 10/26/2023)  . Pneumococcal Vaccine (1 of 2 - PCV) 12/31/2024 (Originally 08/04/1993)  . Hepatitis B Vaccines 19-59 Average Risk (1 of 3 - 19+ 3-dose series) 12/31/2024 (Originally 08/04/1993)  . DTaP/Tdap/Td (2 - Td or Tdap) 06/20/2029  . Colonoscopy  08/02/2032  . HIV Screening  Completed  . HPV VACCINES  Aged Out  . Meningococcal B Vaccine  Aged Out  . COVID-19 Vaccine  Discontinued      ----------------------------------------------------------------------------------------------------------------------------------------------------------------------------------------------------------------- Physical Exam BP 122/77 (BP Location: Left Arm, Patient Position: Sitting, Cuff Size: Normal)   Pulse 65   Ht 5' 6 (1.676 m)   Wt 215 lb (97.5 kg)   SpO2 98%   BMI 34.70 kg/m   Physical Exam  ------------------------------------------------------------------------------------------------------------------------------------------------------------------------------------------------------------------- Assessment and Plan  No problem-specific Assessment & Plan notes found for this encounter.   No orders of the defined types were placed in this encounter.   No follow-ups on file.

## 2024-01-08 ENCOUNTER — Encounter: Payer: Self-pay | Admitting: Family Medicine

## 2024-01-08 DIAGNOSIS — R4189 Other symptoms and signs involving cognitive functions and awareness: Secondary | ICD-10-CM | POA: Insufficient documentation

## 2024-01-08 NOTE — Assessment & Plan Note (Signed)
 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  We discussed managing risk factors including cholesterol, hypertension and blood sugars.  Updating lipid panel.  Blood pressure is well-controlled.  Checking A1c today.  He has quit smoking.  Will also look for additional contributors that may be present including checking thyroid  function as well as B12 level

## 2024-02-01 MED ORDER — ATORVASTATIN CALCIUM 20 MG PO TABS: 20.0000 mg | ORAL_TABLET | Freq: Every day | ORAL | 3 refills | Status: AC

## 2024-05-05 ENCOUNTER — Other Ambulatory Visit
